# Patient Record
Sex: Male | Born: 1940
Health system: Southern US, Community
[De-identification: ages and names within clinical notes are randomized; demographics above are authoritative.]

## PROBLEM LIST (undated history)

## (undated) DIAGNOSIS — B019 Varicella without complication: Secondary | ICD-10-CM

## (undated) DIAGNOSIS — C4491 Basal cell carcinoma of skin, unspecified: Secondary | ICD-10-CM

## (undated) DIAGNOSIS — R55 Syncope and collapse: Secondary | ICD-10-CM

## (undated) DIAGNOSIS — I10 Essential (primary) hypertension: Secondary | ICD-10-CM

## (undated) DIAGNOSIS — D472 Monoclonal gammopathy: Secondary | ICD-10-CM

## (undated) DIAGNOSIS — M1990 Unspecified osteoarthritis, unspecified site: Secondary | ICD-10-CM

## (undated) DIAGNOSIS — Z8619 Personal history of other infectious and parasitic diseases: Secondary | ICD-10-CM

## (undated) HISTORY — PX: BASAL CELL CARCINOMA EXCISION: SHX1214

## (undated) HISTORY — DX: Basal cell carcinoma of skin, unspecified: C44.91

## (undated) HISTORY — DX: Personal history of other infectious and parasitic diseases: Z86.19

## (undated) HISTORY — DX: Unspecified osteoarthritis, unspecified site: M19.90

## (undated) HISTORY — DX: Monoclonal gammopathy: D47.2

## (undated) HISTORY — PX: HERNIA REPAIR: SHX51

## (undated) HISTORY — DX: Varicella without complication: B01.9

## (undated) HISTORY — DX: Syncope and collapse: R55

---

## 2014-12-13 DIAGNOSIS — J029 Acute pharyngitis, unspecified: Secondary | ICD-10-CM | POA: Diagnosis not present

## 2014-12-24 DIAGNOSIS — Z85820 Personal history of malignant melanoma of skin: Secondary | ICD-10-CM | POA: Diagnosis not present

## 2014-12-24 DIAGNOSIS — D225 Melanocytic nevi of trunk: Secondary | ICD-10-CM | POA: Diagnosis not present

## 2014-12-24 DIAGNOSIS — L570 Actinic keratosis: Secondary | ICD-10-CM | POA: Diagnosis not present

## 2014-12-24 DIAGNOSIS — L814 Other melanin hyperpigmentation: Secondary | ICD-10-CM | POA: Diagnosis not present

## 2014-12-25 DIAGNOSIS — S50911A Unspecified superficial injury of right forearm, initial encounter: Secondary | ICD-10-CM | POA: Diagnosis not present

## 2014-12-25 DIAGNOSIS — M79631 Pain in right forearm: Secondary | ICD-10-CM | POA: Diagnosis not present

## 2014-12-27 DIAGNOSIS — J069 Acute upper respiratory infection, unspecified: Secondary | ICD-10-CM | POA: Diagnosis not present

## 2014-12-30 DIAGNOSIS — M25531 Pain in right wrist: Secondary | ICD-10-CM | POA: Diagnosis not present

## 2015-01-07 DIAGNOSIS — M25551 Pain in right hip: Secondary | ICD-10-CM | POA: Diagnosis not present

## 2015-01-12 DIAGNOSIS — M25551 Pain in right hip: Secondary | ICD-10-CM | POA: Diagnosis not present

## 2015-01-24 DIAGNOSIS — I10 Essential (primary) hypertension: Secondary | ICD-10-CM | POA: Diagnosis not present

## 2015-01-24 DIAGNOSIS — Y92128 Other place in nursing home as the place of occurrence of the external cause: Secondary | ICD-10-CM | POA: Diagnosis not present

## 2015-01-24 DIAGNOSIS — S4981XA Other specified injuries of right shoulder and upper arm, initial encounter: Secondary | ICD-10-CM | POA: Diagnosis not present

## 2015-01-24 DIAGNOSIS — Z85820 Personal history of malignant melanoma of skin: Secondary | ICD-10-CM | POA: Diagnosis not present

## 2015-01-24 DIAGNOSIS — Z7982 Long term (current) use of aspirin: Secondary | ICD-10-CM | POA: Diagnosis not present

## 2015-01-24 DIAGNOSIS — S42351A Displaced comminuted fracture of shaft of humerus, right arm, initial encounter for closed fracture: Secondary | ICD-10-CM | POA: Diagnosis not present

## 2015-01-24 DIAGNOSIS — Z87891 Personal history of nicotine dependence: Secondary | ICD-10-CM | POA: Diagnosis not present

## 2015-01-24 DIAGNOSIS — S42291A Other displaced fracture of upper end of right humerus, initial encounter for closed fracture: Secondary | ICD-10-CM | POA: Diagnosis not present

## 2015-01-24 DIAGNOSIS — S42295A Other nondisplaced fracture of upper end of left humerus, initial encounter for closed fracture: Secondary | ICD-10-CM | POA: Diagnosis not present

## 2015-01-24 DIAGNOSIS — S51011A Laceration without foreign body of right elbow, initial encounter: Secondary | ICD-10-CM | POA: Diagnosis not present

## 2015-01-24 DIAGNOSIS — T148 Other injury of unspecified body region: Secondary | ICD-10-CM | POA: Diagnosis not present

## 2015-01-24 DIAGNOSIS — W19XXXA Unspecified fall, initial encounter: Secondary | ICD-10-CM | POA: Diagnosis not present

## 2015-01-24 DIAGNOSIS — S50311A Abrasion of right elbow, initial encounter: Secondary | ICD-10-CM | POA: Diagnosis not present

## 2015-01-25 DIAGNOSIS — S42291A Other displaced fracture of upper end of right humerus, initial encounter for closed fracture: Secondary | ICD-10-CM | POA: Diagnosis not present

## 2015-01-25 DIAGNOSIS — S50311A Abrasion of right elbow, initial encounter: Secondary | ICD-10-CM | POA: Diagnosis present

## 2015-01-25 DIAGNOSIS — W19XXXA Unspecified fall, initial encounter: Secondary | ICD-10-CM | POA: Diagnosis not present

## 2015-01-25 DIAGNOSIS — Z806 Family history of leukemia: Secondary | ICD-10-CM | POA: Diagnosis not present

## 2015-01-25 DIAGNOSIS — Y92128 Other place in nursing home as the place of occurrence of the external cause: Secondary | ICD-10-CM | POA: Diagnosis not present

## 2015-01-25 DIAGNOSIS — Z87891 Personal history of nicotine dependence: Secondary | ICD-10-CM | POA: Diagnosis not present

## 2015-01-25 DIAGNOSIS — S42351A Displaced comminuted fracture of shaft of humerus, right arm, initial encounter for closed fracture: Secondary | ICD-10-CM | POA: Diagnosis not present

## 2015-01-25 DIAGNOSIS — Z7982 Long term (current) use of aspirin: Secondary | ICD-10-CM | POA: Diagnosis not present

## 2015-01-25 DIAGNOSIS — Z85820 Personal history of malignant melanoma of skin: Secondary | ICD-10-CM | POA: Diagnosis not present

## 2015-01-25 DIAGNOSIS — I10 Essential (primary) hypertension: Secondary | ICD-10-CM | POA: Diagnosis not present

## 2015-01-25 DIAGNOSIS — Z8249 Family history of ischemic heart disease and other diseases of the circulatory system: Secondary | ICD-10-CM | POA: Diagnosis not present

## 2015-01-25 DIAGNOSIS — S51011A Laceration without foreign body of right elbow, initial encounter: Secondary | ICD-10-CM | POA: Diagnosis not present

## 2015-01-26 DIAGNOSIS — S51011A Laceration without foreign body of right elbow, initial encounter: Secondary | ICD-10-CM | POA: Diagnosis not present

## 2015-01-26 DIAGNOSIS — S42291A Other displaced fracture of upper end of right humerus, initial encounter for closed fracture: Secondary | ICD-10-CM | POA: Diagnosis not present

## 2015-01-26 DIAGNOSIS — M25511 Pain in right shoulder: Secondary | ICD-10-CM | POA: Diagnosis not present

## 2015-02-02 DIAGNOSIS — M25522 Pain in left elbow: Secondary | ICD-10-CM | POA: Diagnosis not present

## 2015-02-04 DIAGNOSIS — S42301D Unspecified fracture of shaft of humerus, right arm, subsequent encounter for fracture with routine healing: Secondary | ICD-10-CM | POA: Diagnosis not present

## 2015-02-04 DIAGNOSIS — J029 Acute pharyngitis, unspecified: Secondary | ICD-10-CM | POA: Diagnosis not present

## 2015-02-11 DIAGNOSIS — K120 Recurrent oral aphthae: Secondary | ICD-10-CM | POA: Diagnosis not present

## 2015-02-16 DIAGNOSIS — S52531D Colles' fracture of right radius, subsequent encounter for closed fracture with routine healing: Secondary | ICD-10-CM | POA: Diagnosis not present

## 2015-02-21 DIAGNOSIS — M25621 Stiffness of right elbow, not elsewhere classified: Secondary | ICD-10-CM | POA: Diagnosis not present

## 2015-02-21 DIAGNOSIS — S52531A Colles' fracture of right radius, initial encounter for closed fracture: Secondary | ICD-10-CM | POA: Diagnosis not present

## 2015-02-21 DIAGNOSIS — M25421 Effusion, right elbow: Secondary | ICD-10-CM | POA: Diagnosis not present

## 2015-02-21 DIAGNOSIS — M25531 Pain in right wrist: Secondary | ICD-10-CM | POA: Diagnosis not present

## 2015-02-21 DIAGNOSIS — B002 Herpesviral gingivostomatitis and pharyngotonsillitis: Secondary | ICD-10-CM | POA: Diagnosis not present

## 2015-02-23 DIAGNOSIS — M25531 Pain in right wrist: Secondary | ICD-10-CM | POA: Diagnosis not present

## 2015-02-23 DIAGNOSIS — S51011A Laceration without foreign body of right elbow, initial encounter: Secondary | ICD-10-CM | POA: Diagnosis not present

## 2015-02-23 DIAGNOSIS — M25421 Effusion, right elbow: Secondary | ICD-10-CM | POA: Diagnosis not present

## 2015-02-23 DIAGNOSIS — S42291S Other displaced fracture of upper end of right humerus, sequela: Secondary | ICD-10-CM | POA: Diagnosis not present

## 2015-02-28 DIAGNOSIS — M25621 Stiffness of right elbow, not elsewhere classified: Secondary | ICD-10-CM | POA: Diagnosis not present

## 2015-02-28 DIAGNOSIS — M25421 Effusion, right elbow: Secondary | ICD-10-CM | POA: Diagnosis not present

## 2015-02-28 DIAGNOSIS — M25531 Pain in right wrist: Secondary | ICD-10-CM | POA: Diagnosis not present

## 2015-02-28 DIAGNOSIS — S42291S Other displaced fracture of upper end of right humerus, sequela: Secondary | ICD-10-CM | POA: Diagnosis not present

## 2015-03-02 DIAGNOSIS — M25511 Pain in right shoulder: Secondary | ICD-10-CM | POA: Diagnosis not present

## 2015-03-02 DIAGNOSIS — M25621 Stiffness of right elbow, not elsewhere classified: Secondary | ICD-10-CM | POA: Diagnosis not present

## 2015-03-02 DIAGNOSIS — S42291S Other displaced fracture of upper end of right humerus, sequela: Secondary | ICD-10-CM | POA: Diagnosis not present

## 2015-03-02 DIAGNOSIS — S51011A Laceration without foreign body of right elbow, initial encounter: Secondary | ICD-10-CM | POA: Diagnosis not present

## 2015-03-04 DIAGNOSIS — J384 Edema of larynx: Secondary | ICD-10-CM | POA: Diagnosis not present

## 2015-03-04 DIAGNOSIS — K1230 Oral mucositis (ulcerative), unspecified: Secondary | ICD-10-CM | POA: Diagnosis not present

## 2015-03-04 DIAGNOSIS — K121 Other forms of stomatitis: Secondary | ICD-10-CM | POA: Diagnosis not present

## 2015-03-07 DIAGNOSIS — S42291S Other displaced fracture of upper end of right humerus, sequela: Secondary | ICD-10-CM | POA: Diagnosis not present

## 2015-03-07 DIAGNOSIS — M25511 Pain in right shoulder: Secondary | ICD-10-CM | POA: Diagnosis not present

## 2015-03-07 DIAGNOSIS — M25421 Effusion, right elbow: Secondary | ICD-10-CM | POA: Diagnosis not present

## 2015-03-07 DIAGNOSIS — M25621 Stiffness of right elbow, not elsewhere classified: Secondary | ICD-10-CM | POA: Diagnosis not present

## 2015-03-10 DIAGNOSIS — M25421 Effusion, right elbow: Secondary | ICD-10-CM | POA: Diagnosis not present

## 2015-03-10 DIAGNOSIS — M25621 Stiffness of right elbow, not elsewhere classified: Secondary | ICD-10-CM | POA: Diagnosis not present

## 2015-03-10 DIAGNOSIS — S52531D Colles' fracture of right radius, subsequent encounter for closed fracture with routine healing: Secondary | ICD-10-CM | POA: Diagnosis not present

## 2015-03-10 DIAGNOSIS — S42291S Other displaced fracture of upper end of right humerus, sequela: Secondary | ICD-10-CM | POA: Diagnosis not present

## 2015-03-14 DIAGNOSIS — S42291S Other displaced fracture of upper end of right humerus, sequela: Secondary | ICD-10-CM | POA: Diagnosis not present

## 2015-03-14 DIAGNOSIS — M25511 Pain in right shoulder: Secondary | ICD-10-CM | POA: Diagnosis not present

## 2015-03-14 DIAGNOSIS — M25421 Effusion, right elbow: Secondary | ICD-10-CM | POA: Diagnosis not present

## 2015-03-14 DIAGNOSIS — M25621 Stiffness of right elbow, not elsewhere classified: Secondary | ICD-10-CM | POA: Diagnosis not present

## 2015-03-16 DIAGNOSIS — S42291S Other displaced fracture of upper end of right humerus, sequela: Secondary | ICD-10-CM | POA: Diagnosis not present

## 2015-03-16 DIAGNOSIS — K1230 Oral mucositis (ulcerative), unspecified: Secondary | ICD-10-CM | POA: Diagnosis not present

## 2015-03-16 DIAGNOSIS — K121 Other forms of stomatitis: Secondary | ICD-10-CM | POA: Diagnosis not present

## 2015-03-17 DIAGNOSIS — Z85820 Personal history of malignant melanoma of skin: Secondary | ICD-10-CM | POA: Diagnosis not present

## 2015-03-17 DIAGNOSIS — Z87442 Personal history of urinary calculi: Secondary | ICD-10-CM | POA: Diagnosis not present

## 2015-03-17 DIAGNOSIS — Z7982 Long term (current) use of aspirin: Secondary | ICD-10-CM | POA: Diagnosis not present

## 2015-03-17 DIAGNOSIS — M545 Low back pain: Secondary | ICD-10-CM | POA: Diagnosis not present

## 2015-03-17 DIAGNOSIS — R52 Pain, unspecified: Secondary | ICD-10-CM | POA: Diagnosis not present

## 2015-03-17 DIAGNOSIS — I10 Essential (primary) hypertension: Secondary | ICD-10-CM | POA: Diagnosis not present

## 2015-03-17 DIAGNOSIS — N201 Calculus of ureter: Secondary | ICD-10-CM | POA: Diagnosis not present

## 2015-03-17 DIAGNOSIS — N200 Calculus of kidney: Secondary | ICD-10-CM | POA: Diagnosis not present

## 2015-03-17 DIAGNOSIS — Z79899 Other long term (current) drug therapy: Secondary | ICD-10-CM | POA: Diagnosis not present

## 2015-03-18 DIAGNOSIS — N400 Enlarged prostate without lower urinary tract symptoms: Secondary | ICD-10-CM | POA: Diagnosis not present

## 2015-03-18 DIAGNOSIS — N201 Calculus of ureter: Secondary | ICD-10-CM | POA: Diagnosis not present

## 2015-03-22 DIAGNOSIS — N400 Enlarged prostate without lower urinary tract symptoms: Secondary | ICD-10-CM | POA: Diagnosis not present

## 2015-03-22 DIAGNOSIS — N201 Calculus of ureter: Secondary | ICD-10-CM | POA: Diagnosis not present

## 2015-03-22 DIAGNOSIS — N200 Calculus of kidney: Secondary | ICD-10-CM | POA: Diagnosis not present

## 2015-03-22 DIAGNOSIS — Z79899 Other long term (current) drug therapy: Secondary | ICD-10-CM | POA: Diagnosis not present

## 2015-03-22 DIAGNOSIS — Z87891 Personal history of nicotine dependence: Secondary | ICD-10-CM | POA: Diagnosis not present

## 2015-03-22 DIAGNOSIS — I10 Essential (primary) hypertension: Secondary | ICD-10-CM | POA: Diagnosis not present

## 2015-03-22 DIAGNOSIS — Z7982 Long term (current) use of aspirin: Secondary | ICD-10-CM | POA: Diagnosis not present

## 2015-03-24 DIAGNOSIS — M25511 Pain in right shoulder: Secondary | ICD-10-CM | POA: Diagnosis not present

## 2015-03-24 DIAGNOSIS — M25611 Stiffness of right shoulder, not elsewhere classified: Secondary | ICD-10-CM | POA: Diagnosis not present

## 2015-03-24 DIAGNOSIS — S42291S Other displaced fracture of upper end of right humerus, sequela: Secondary | ICD-10-CM | POA: Diagnosis not present

## 2015-03-24 DIAGNOSIS — M25621 Stiffness of right elbow, not elsewhere classified: Secondary | ICD-10-CM | POA: Diagnosis not present

## 2015-03-30 DIAGNOSIS — N201 Calculus of ureter: Secondary | ICD-10-CM | POA: Diagnosis not present

## 2015-03-30 DIAGNOSIS — N200 Calculus of kidney: Secondary | ICD-10-CM | POA: Diagnosis not present

## 2015-03-31 DIAGNOSIS — M25621 Stiffness of right elbow, not elsewhere classified: Secondary | ICD-10-CM | POA: Diagnosis not present

## 2015-03-31 DIAGNOSIS — M25611 Stiffness of right shoulder, not elsewhere classified: Secondary | ICD-10-CM | POA: Diagnosis not present

## 2015-03-31 DIAGNOSIS — S42291S Other displaced fracture of upper end of right humerus, sequela: Secondary | ICD-10-CM | POA: Diagnosis not present

## 2015-04-04 DIAGNOSIS — M25621 Stiffness of right elbow, not elsewhere classified: Secondary | ICD-10-CM | POA: Diagnosis not present

## 2015-04-04 DIAGNOSIS — M25531 Pain in right wrist: Secondary | ICD-10-CM | POA: Diagnosis not present

## 2015-04-04 DIAGNOSIS — M25511 Pain in right shoulder: Secondary | ICD-10-CM | POA: Diagnosis not present

## 2015-04-04 DIAGNOSIS — M25611 Stiffness of right shoulder, not elsewhere classified: Secondary | ICD-10-CM | POA: Diagnosis not present

## 2015-04-07 DIAGNOSIS — M25621 Stiffness of right elbow, not elsewhere classified: Secondary | ICD-10-CM | POA: Diagnosis not present

## 2015-04-07 DIAGNOSIS — S42291S Other displaced fracture of upper end of right humerus, sequela: Secondary | ICD-10-CM | POA: Diagnosis not present

## 2015-04-07 DIAGNOSIS — M25611 Stiffness of right shoulder, not elsewhere classified: Secondary | ICD-10-CM | POA: Diagnosis not present

## 2015-04-07 DIAGNOSIS — M25511 Pain in right shoulder: Secondary | ICD-10-CM | POA: Diagnosis not present

## 2015-04-12 DIAGNOSIS — M25511 Pain in right shoulder: Secondary | ICD-10-CM | POA: Diagnosis not present

## 2015-04-12 DIAGNOSIS — M25621 Stiffness of right elbow, not elsewhere classified: Secondary | ICD-10-CM | POA: Diagnosis not present

## 2015-04-12 DIAGNOSIS — M25611 Stiffness of right shoulder, not elsewhere classified: Secondary | ICD-10-CM | POA: Diagnosis not present

## 2015-04-12 DIAGNOSIS — S42291S Other displaced fracture of upper end of right humerus, sequela: Secondary | ICD-10-CM | POA: Diagnosis not present

## 2015-04-14 DIAGNOSIS — N201 Calculus of ureter: Secondary | ICD-10-CM | POA: Diagnosis not present

## 2015-04-20 DIAGNOSIS — S42291S Other displaced fracture of upper end of right humerus, sequela: Secondary | ICD-10-CM | POA: Diagnosis not present

## 2015-04-20 DIAGNOSIS — G5731 Lesion of lateral popliteal nerve, right lower limb: Secondary | ICD-10-CM | POA: Diagnosis not present

## 2015-04-20 DIAGNOSIS — M25511 Pain in right shoulder: Secondary | ICD-10-CM | POA: Diagnosis not present

## 2015-04-20 DIAGNOSIS — M25621 Stiffness of right elbow, not elsewhere classified: Secondary | ICD-10-CM | POA: Diagnosis not present

## 2015-04-20 DIAGNOSIS — S42294D Other nondisplaced fracture of upper end of right humerus, subsequent encounter for fracture with routine healing: Secondary | ICD-10-CM | POA: Diagnosis not present

## 2015-04-20 DIAGNOSIS — M25611 Stiffness of right shoulder, not elsewhere classified: Secondary | ICD-10-CM | POA: Diagnosis not present

## 2015-04-25 DIAGNOSIS — M21371 Foot drop, right foot: Secondary | ICD-10-CM | POA: Diagnosis not present

## 2015-04-25 DIAGNOSIS — M545 Low back pain: Secondary | ICD-10-CM | POA: Diagnosis not present

## 2015-04-26 DIAGNOSIS — Z0000 Encounter for general adult medical examination without abnormal findings: Secondary | ICD-10-CM | POA: Diagnosis not present

## 2015-04-26 DIAGNOSIS — R972 Elevated prostate specific antigen [PSA]: Secondary | ICD-10-CM | POA: Diagnosis not present

## 2015-04-26 DIAGNOSIS — I10 Essential (primary) hypertension: Secondary | ICD-10-CM | POA: Diagnosis not present

## 2015-04-26 DIAGNOSIS — N200 Calculus of kidney: Secondary | ICD-10-CM | POA: Diagnosis not present

## 2015-04-26 DIAGNOSIS — C4359 Malignant melanoma of other part of trunk: Secondary | ICD-10-CM | POA: Diagnosis not present

## 2015-04-28 DIAGNOSIS — M25611 Stiffness of right shoulder, not elsewhere classified: Secondary | ICD-10-CM | POA: Diagnosis not present

## 2015-04-28 DIAGNOSIS — S42291S Other displaced fracture of upper end of right humerus, sequela: Secondary | ICD-10-CM | POA: Diagnosis not present

## 2015-04-28 DIAGNOSIS — M25621 Stiffness of right elbow, not elsewhere classified: Secondary | ICD-10-CM | POA: Diagnosis not present

## 2015-04-28 DIAGNOSIS — M6281 Muscle weakness (generalized): Secondary | ICD-10-CM | POA: Diagnosis not present

## 2015-05-02 DIAGNOSIS — Z135 Encounter for screening for eye and ear disorders: Secondary | ICD-10-CM | POA: Diagnosis not present

## 2015-05-02 DIAGNOSIS — H40013 Open angle with borderline findings, low risk, bilateral: Secondary | ICD-10-CM | POA: Diagnosis not present

## 2015-05-02 DIAGNOSIS — H2513 Age-related nuclear cataract, bilateral: Secondary | ICD-10-CM | POA: Diagnosis not present

## 2015-05-02 DIAGNOSIS — H5213 Myopia, bilateral: Secondary | ICD-10-CM | POA: Diagnosis not present

## 2015-05-03 DIAGNOSIS — M25611 Stiffness of right shoulder, not elsewhere classified: Secondary | ICD-10-CM | POA: Diagnosis not present

## 2015-05-03 DIAGNOSIS — M6281 Muscle weakness (generalized): Secondary | ICD-10-CM | POA: Diagnosis not present

## 2015-05-05 DIAGNOSIS — M6281 Muscle weakness (generalized): Secondary | ICD-10-CM | POA: Diagnosis not present

## 2015-05-05 DIAGNOSIS — M25611 Stiffness of right shoulder, not elsewhere classified: Secondary | ICD-10-CM | POA: Diagnosis not present

## 2015-05-05 DIAGNOSIS — S42294D Other nondisplaced fracture of upper end of right humerus, subsequent encounter for fracture with routine healing: Secondary | ICD-10-CM | POA: Diagnosis not present

## 2015-05-06 DIAGNOSIS — M4806 Spinal stenosis, lumbar region: Secondary | ICD-10-CM | POA: Diagnosis not present

## 2015-05-06 DIAGNOSIS — M21371 Foot drop, right foot: Secondary | ICD-10-CM | POA: Diagnosis not present

## 2015-05-12 DIAGNOSIS — M25621 Stiffness of right elbow, not elsewhere classified: Secondary | ICD-10-CM | POA: Diagnosis not present

## 2015-05-12 DIAGNOSIS — M6281 Muscle weakness (generalized): Secondary | ICD-10-CM | POA: Diagnosis not present

## 2015-05-12 DIAGNOSIS — S42294D Other nondisplaced fracture of upper end of right humerus, subsequent encounter for fracture with routine healing: Secondary | ICD-10-CM | POA: Diagnosis not present

## 2015-05-12 DIAGNOSIS — M25611 Stiffness of right shoulder, not elsewhere classified: Secondary | ICD-10-CM | POA: Diagnosis not present

## 2015-05-19 DIAGNOSIS — S42294D Other nondisplaced fracture of upper end of right humerus, subsequent encounter for fracture with routine healing: Secondary | ICD-10-CM | POA: Diagnosis not present

## 2015-05-19 DIAGNOSIS — M6281 Muscle weakness (generalized): Secondary | ICD-10-CM | POA: Diagnosis not present

## 2015-05-19 DIAGNOSIS — M25611 Stiffness of right shoulder, not elsewhere classified: Secondary | ICD-10-CM | POA: Diagnosis not present

## 2015-05-23 DIAGNOSIS — M21371 Foot drop, right foot: Secondary | ICD-10-CM | POA: Diagnosis not present

## 2015-05-23 DIAGNOSIS — M4806 Spinal stenosis, lumbar region: Secondary | ICD-10-CM | POA: Diagnosis not present

## 2015-05-23 DIAGNOSIS — M6281 Muscle weakness (generalized): Secondary | ICD-10-CM | POA: Diagnosis not present

## 2015-05-23 DIAGNOSIS — M25621 Stiffness of right elbow, not elsewhere classified: Secondary | ICD-10-CM | POA: Diagnosis not present

## 2015-05-23 DIAGNOSIS — M25611 Stiffness of right shoulder, not elsewhere classified: Secondary | ICD-10-CM | POA: Diagnosis not present

## 2015-05-23 DIAGNOSIS — G5730 Lesion of lateral popliteal nerve, unspecified lower limb: Secondary | ICD-10-CM | POA: Diagnosis not present

## 2015-05-31 DIAGNOSIS — S40021A Contusion of right upper arm, initial encounter: Secondary | ICD-10-CM | POA: Diagnosis not present

## 2015-06-09 DIAGNOSIS — D649 Anemia, unspecified: Secondary | ICD-10-CM | POA: Diagnosis not present

## 2015-06-16 DIAGNOSIS — M810 Age-related osteoporosis without current pathological fracture: Secondary | ICD-10-CM | POA: Diagnosis not present

## 2015-06-24 DIAGNOSIS — L570 Actinic keratosis: Secondary | ICD-10-CM | POA: Diagnosis not present

## 2015-06-24 DIAGNOSIS — L821 Other seborrheic keratosis: Secondary | ICD-10-CM | POA: Diagnosis not present

## 2015-06-24 DIAGNOSIS — D1801 Hemangioma of skin and subcutaneous tissue: Secondary | ICD-10-CM | POA: Diagnosis not present

## 2015-06-24 DIAGNOSIS — Z85820 Personal history of malignant melanoma of skin: Secondary | ICD-10-CM | POA: Diagnosis not present

## 2015-07-01 DIAGNOSIS — R5383 Other fatigue: Secondary | ICD-10-CM | POA: Diagnosis not present

## 2015-07-01 DIAGNOSIS — M810 Age-related osteoporosis without current pathological fracture: Secondary | ICD-10-CM | POA: Diagnosis not present

## 2015-07-21 DIAGNOSIS — D649 Anemia, unspecified: Secondary | ICD-10-CM | POA: Diagnosis not present

## 2015-07-25 DIAGNOSIS — G5730 Lesion of lateral popliteal nerve, unspecified lower limb: Secondary | ICD-10-CM | POA: Diagnosis not present

## 2015-07-25 DIAGNOSIS — M21371 Foot drop, right foot: Secondary | ICD-10-CM | POA: Diagnosis not present

## 2015-07-27 DIAGNOSIS — F0390 Unspecified dementia without behavioral disturbance: Secondary | ICD-10-CM | POA: Diagnosis not present

## 2015-08-04 DIAGNOSIS — F0390 Unspecified dementia without behavioral disturbance: Secondary | ICD-10-CM | POA: Diagnosis not present

## 2015-08-04 DIAGNOSIS — R220 Localized swelling, mass and lump, head: Secondary | ICD-10-CM | POA: Diagnosis not present

## 2015-08-10 DIAGNOSIS — W108XXA Fall (on) (from) other stairs and steps, initial encounter: Secondary | ICD-10-CM | POA: Diagnosis not present

## 2015-08-10 DIAGNOSIS — T148 Other injury of unspecified body region: Secondary | ICD-10-CM | POA: Diagnosis not present

## 2015-08-10 DIAGNOSIS — Z6822 Body mass index (BMI) 22.0-22.9, adult: Secondary | ICD-10-CM | POA: Diagnosis not present

## 2015-08-13 DIAGNOSIS — Z6822 Body mass index (BMI) 22.0-22.9, adult: Secondary | ICD-10-CM | POA: Diagnosis not present

## 2015-08-13 DIAGNOSIS — Z5189 Encounter for other specified aftercare: Secondary | ICD-10-CM | POA: Diagnosis not present

## 2015-08-16 DIAGNOSIS — Z6822 Body mass index (BMI) 22.0-22.9, adult: Secondary | ICD-10-CM | POA: Diagnosis not present

## 2015-08-16 DIAGNOSIS — Z4800 Encounter for change or removal of nonsurgical wound dressing: Secondary | ICD-10-CM | POA: Diagnosis not present

## 2015-08-16 DIAGNOSIS — Z5189 Encounter for other specified aftercare: Secondary | ICD-10-CM | POA: Diagnosis not present

## 2015-08-17 DIAGNOSIS — Q283 Other malformations of cerebral vessels: Secondary | ICD-10-CM | POA: Diagnosis not present

## 2015-08-17 DIAGNOSIS — R220 Localized swelling, mass and lump, head: Secondary | ICD-10-CM | POA: Diagnosis not present

## 2015-08-17 DIAGNOSIS — I10 Essential (primary) hypertension: Secondary | ICD-10-CM | POA: Diagnosis not present

## 2015-08-17 DIAGNOSIS — F0390 Unspecified dementia without behavioral disturbance: Secondary | ICD-10-CM | POA: Diagnosis not present

## 2015-08-22 DIAGNOSIS — F0390 Unspecified dementia without behavioral disturbance: Secondary | ICD-10-CM | POA: Diagnosis not present

## 2015-09-14 DIAGNOSIS — G939 Disorder of brain, unspecified: Secondary | ICD-10-CM | POA: Diagnosis not present

## 2015-09-19 DIAGNOSIS — Z79899 Other long term (current) drug therapy: Secondary | ICD-10-CM | POA: Diagnosis not present

## 2015-09-19 DIAGNOSIS — I10 Essential (primary) hypertension: Secondary | ICD-10-CM | POA: Diagnosis not present

## 2015-09-19 DIAGNOSIS — H1132 Conjunctival hemorrhage, left eye: Secondary | ICD-10-CM | POA: Diagnosis not present

## 2015-09-20 DIAGNOSIS — Q046 Congenital cerebral cysts: Secondary | ICD-10-CM | POA: Diagnosis not present

## 2015-09-20 DIAGNOSIS — R413 Other amnesia: Secondary | ICD-10-CM | POA: Diagnosis not present

## 2015-09-20 DIAGNOSIS — G609 Hereditary and idiopathic neuropathy, unspecified: Secondary | ICD-10-CM | POA: Diagnosis not present

## 2015-09-22 DIAGNOSIS — H01024 Squamous blepharitis left upper eyelid: Secondary | ICD-10-CM | POA: Diagnosis not present

## 2015-09-22 DIAGNOSIS — H1132 Conjunctival hemorrhage, left eye: Secondary | ICD-10-CM | POA: Diagnosis not present

## 2015-09-22 DIAGNOSIS — H2513 Age-related nuclear cataract, bilateral: Secondary | ICD-10-CM | POA: Diagnosis not present

## 2015-09-22 DIAGNOSIS — H01021 Squamous blepharitis right upper eyelid: Secondary | ICD-10-CM | POA: Diagnosis not present

## 2015-09-27 DIAGNOSIS — Z23 Encounter for immunization: Secondary | ICD-10-CM | POA: Diagnosis not present

## 2015-11-01 DIAGNOSIS — R531 Weakness: Secondary | ICD-10-CM | POA: Diagnosis not present

## 2015-11-01 DIAGNOSIS — M818 Other osteoporosis without current pathological fracture: Secondary | ICD-10-CM | POA: Diagnosis not present

## 2015-11-01 DIAGNOSIS — D472 Monoclonal gammopathy: Secondary | ICD-10-CM | POA: Diagnosis not present

## 2015-11-07 DIAGNOSIS — N201 Calculus of ureter: Secondary | ICD-10-CM | POA: Diagnosis not present

## 2015-11-11 DIAGNOSIS — R079 Chest pain, unspecified: Secondary | ICD-10-CM | POA: Diagnosis not present

## 2015-11-11 DIAGNOSIS — W010XXA Fall on same level from slipping, tripping and stumbling without subsequent striking against object, initial encounter: Secondary | ICD-10-CM | POA: Diagnosis not present

## 2015-11-11 DIAGNOSIS — S50312A Abrasion of left elbow, initial encounter: Secondary | ICD-10-CM | POA: Diagnosis not present

## 2015-11-11 DIAGNOSIS — S80212A Abrasion, left knee, initial encounter: Secondary | ICD-10-CM | POA: Diagnosis not present

## 2015-11-11 DIAGNOSIS — I10 Essential (primary) hypertension: Secondary | ICD-10-CM | POA: Diagnosis not present

## 2015-11-11 DIAGNOSIS — S20212A Contusion of left front wall of thorax, initial encounter: Secondary | ICD-10-CM | POA: Diagnosis not present

## 2015-11-12 DIAGNOSIS — R079 Chest pain, unspecified: Secondary | ICD-10-CM | POA: Diagnosis not present

## 2015-11-12 DIAGNOSIS — W19XXXA Unspecified fall, initial encounter: Secondary | ICD-10-CM | POA: Diagnosis not present

## 2015-11-12 DIAGNOSIS — S62391A Other fracture of second metacarpal bone, left hand, initial encounter for closed fracture: Secondary | ICD-10-CM | POA: Diagnosis not present

## 2015-11-12 DIAGNOSIS — S20212A Contusion of left front wall of thorax, initial encounter: Secondary | ICD-10-CM | POA: Diagnosis not present

## 2015-11-12 DIAGNOSIS — S80212A Abrasion, left knee, initial encounter: Secondary | ICD-10-CM | POA: Diagnosis not present

## 2015-11-12 DIAGNOSIS — I10 Essential (primary) hypertension: Secondary | ICD-10-CM | POA: Diagnosis not present

## 2015-11-12 DIAGNOSIS — W010XXA Fall on same level from slipping, tripping and stumbling without subsequent striking against object, initial encounter: Secondary | ICD-10-CM | POA: Diagnosis not present

## 2015-11-12 DIAGNOSIS — S50312A Abrasion of left elbow, initial encounter: Secondary | ICD-10-CM | POA: Diagnosis not present

## 2015-11-12 DIAGNOSIS — S40812A Abrasion of left upper arm, initial encounter: Secondary | ICD-10-CM | POA: Diagnosis not present

## 2015-11-12 DIAGNOSIS — S6722XA Crushing injury of left hand, initial encounter: Secondary | ICD-10-CM | POA: Diagnosis not present

## 2015-11-12 DIAGNOSIS — Y998 Other external cause status: Secondary | ICD-10-CM | POA: Diagnosis not present

## 2015-11-12 DIAGNOSIS — M79645 Pain in left finger(s): Secondary | ICD-10-CM | POA: Diagnosis not present

## 2015-11-17 DIAGNOSIS — D472 Monoclonal gammopathy: Secondary | ICD-10-CM | POA: Diagnosis not present

## 2015-11-17 DIAGNOSIS — M818 Other osteoporosis without current pathological fracture: Secondary | ICD-10-CM | POA: Diagnosis not present

## 2015-11-22 DIAGNOSIS — M818 Other osteoporosis without current pathological fracture: Secondary | ICD-10-CM | POA: Diagnosis not present

## 2015-11-22 DIAGNOSIS — Z4800 Encounter for change or removal of nonsurgical wound dressing: Secondary | ICD-10-CM | POA: Diagnosis not present

## 2015-11-22 DIAGNOSIS — W19XXXA Unspecified fall, initial encounter: Secondary | ICD-10-CM | POA: Diagnosis not present

## 2015-11-22 DIAGNOSIS — T148 Other injury of unspecified body region: Secondary | ICD-10-CM | POA: Diagnosis not present

## 2015-11-22 DIAGNOSIS — Z6822 Body mass index (BMI) 22.0-22.9, adult: Secondary | ICD-10-CM | POA: Diagnosis not present

## 2015-11-23 DIAGNOSIS — S6990XA Unspecified injury of unspecified wrist, hand and finger(s), initial encounter: Secondary | ICD-10-CM | POA: Diagnosis not present

## 2015-12-16 DIAGNOSIS — J019 Acute sinusitis, unspecified: Secondary | ICD-10-CM | POA: Diagnosis not present

## 2015-12-27 DIAGNOSIS — Z23 Encounter for immunization: Secondary | ICD-10-CM | POA: Diagnosis not present

## 2015-12-27 DIAGNOSIS — R972 Elevated prostate specific antigen [PSA]: Secondary | ICD-10-CM | POA: Diagnosis not present

## 2015-12-27 DIAGNOSIS — M810 Age-related osteoporosis without current pathological fracture: Secondary | ICD-10-CM | POA: Diagnosis not present

## 2015-12-27 DIAGNOSIS — I10 Essential (primary) hypertension: Secondary | ICD-10-CM | POA: Diagnosis not present

## 2015-12-27 DIAGNOSIS — C4359 Malignant melanoma of other part of trunk: Secondary | ICD-10-CM | POA: Diagnosis not present

## 2015-12-27 DIAGNOSIS — Z111 Encounter for screening for respiratory tuberculosis: Secondary | ICD-10-CM | POA: Diagnosis not present

## 2015-12-27 DIAGNOSIS — N200 Calculus of kidney: Secondary | ICD-10-CM | POA: Diagnosis not present

## 2015-12-29 DIAGNOSIS — L821 Other seborrheic keratosis: Secondary | ICD-10-CM | POA: Diagnosis not present

## 2015-12-29 DIAGNOSIS — L814 Other melanin hyperpigmentation: Secondary | ICD-10-CM | POA: Diagnosis not present

## 2015-12-29 DIAGNOSIS — D1801 Hemangioma of skin and subcutaneous tissue: Secondary | ICD-10-CM | POA: Diagnosis not present

## 2015-12-29 DIAGNOSIS — L570 Actinic keratosis: Secondary | ICD-10-CM | POA: Diagnosis not present

## 2016-01-27 DIAGNOSIS — Z23 Encounter for immunization: Secondary | ICD-10-CM | POA: Diagnosis not present

## 2016-02-27 DIAGNOSIS — G609 Hereditary and idiopathic neuropathy, unspecified: Secondary | ICD-10-CM | POA: Diagnosis not present

## 2016-02-27 DIAGNOSIS — Q046 Congenital cerebral cysts: Secondary | ICD-10-CM | POA: Diagnosis not present

## 2016-03-29 DIAGNOSIS — G609 Hereditary and idiopathic neuropathy, unspecified: Secondary | ICD-10-CM | POA: Diagnosis not present

## 2016-03-29 DIAGNOSIS — Q046 Congenital cerebral cysts: Secondary | ICD-10-CM | POA: Diagnosis not present

## 2016-03-29 DIAGNOSIS — D472 Monoclonal gammopathy: Secondary | ICD-10-CM | POA: Diagnosis not present

## 2016-04-05 DIAGNOSIS — M6281 Muscle weakness (generalized): Secondary | ICD-10-CM | POA: Diagnosis not present

## 2016-04-05 DIAGNOSIS — R2681 Unsteadiness on feet: Secondary | ICD-10-CM | POA: Diagnosis not present

## 2016-04-10 ENCOUNTER — Ambulatory Visit (INDEPENDENT_AMBULATORY_CARE_PROVIDER_SITE_OTHER): Payer: Medicare Other | Admitting: Family Medicine

## 2016-04-10 ENCOUNTER — Encounter: Payer: Self-pay | Admitting: Family Medicine

## 2016-04-10 VITALS — BP 122/84 | HR 83 | Temp 98.2°F | Ht 67.0 in | Wt 148.2 lb

## 2016-04-10 DIAGNOSIS — I10 Essential (primary) hypertension: Secondary | ICD-10-CM | POA: Diagnosis not present

## 2016-04-10 DIAGNOSIS — M25511 Pain in right shoulder: Secondary | ICD-10-CM

## 2016-04-10 DIAGNOSIS — D472 Monoclonal gammopathy: Secondary | ICD-10-CM | POA: Insufficient documentation

## 2016-04-10 DIAGNOSIS — M25611 Stiffness of right shoulder, not elsewhere classified: Secondary | ICD-10-CM | POA: Insufficient documentation

## 2016-04-10 DIAGNOSIS — E8809 Other disorders of plasma-protein metabolism, not elsewhere classified: Secondary | ICD-10-CM | POA: Diagnosis not present

## 2016-04-10 DIAGNOSIS — R779 Abnormality of plasma protein, unspecified: Secondary | ICD-10-CM

## 2016-04-10 NOTE — Assessment & Plan Note (Signed)
Patient reports recently he was found to have elevated protein in his blood. He has an appointment with [facility] next week. He'll keep this appointment.
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
## 2016-04-10 NOTE — Assessment & Plan Note (Signed)
Patient with prior history of fracture and right upper arm or shoulder. Has decreased range of motion. We will refer to occupational therapy for further management and evaluation of this issue. We'll see back in one month to ensure he is improving.

## 2016-04-10 NOTE — Patient Instructions (Signed)
Nice to meet you. We are going to refer you to occupational therapy. Please continue your lisinopril for your blood pressure. Please keep your follow-up with hematology for your protein.

## 2016-04-10 NOTE — Assessment & Plan Note (Signed)
Blood pressure at goal. Continue lisinopril. We will request records from his prior PCP

## 2016-04-10 NOTE — Progress Notes (Signed)
Pre visit review using our clinic review tool, if applicable. No additional management support is needed unless otherwise documented below in the visit note. 

## 2016-04-11 DIAGNOSIS — M6281 Muscle weakness (generalized): Secondary | ICD-10-CM | POA: Diagnosis not present

## 2016-04-11 DIAGNOSIS — R2681 Unsteadiness on feet: Secondary | ICD-10-CM | POA: Diagnosis not present

## 2016-04-12 DIAGNOSIS — M6281 Muscle weakness (generalized): Secondary | ICD-10-CM | POA: Diagnosis not present

## 2016-04-12 DIAGNOSIS — R2681 Unsteadiness on feet: Secondary | ICD-10-CM | POA: Diagnosis not present

## 2016-04-17 DIAGNOSIS — R2681 Unsteadiness on feet: Secondary | ICD-10-CM | POA: Diagnosis not present

## 2016-04-17 DIAGNOSIS — D472 Monoclonal gammopathy: Secondary | ICD-10-CM | POA: Diagnosis not present

## 2016-04-17 DIAGNOSIS — M6281 Muscle weakness (generalized): Secondary | ICD-10-CM | POA: Diagnosis not present

## 2016-04-17 DIAGNOSIS — I10 Essential (primary) hypertension: Secondary | ICD-10-CM | POA: Diagnosis not present

## 2016-04-17 DIAGNOSIS — Z87891 Personal history of nicotine dependence: Secondary | ICD-10-CM | POA: Diagnosis not present

## 2016-04-17 DIAGNOSIS — R413 Other amnesia: Secondary | ICD-10-CM | POA: Diagnosis not present

## 2016-04-17 DIAGNOSIS — D649 Anemia, unspecified: Secondary | ICD-10-CM | POA: Diagnosis not present

## 2016-04-17 DIAGNOSIS — M810 Age-related osteoporosis without current pathological fracture: Secondary | ICD-10-CM | POA: Diagnosis not present

## 2016-04-19 DIAGNOSIS — R2681 Unsteadiness on feet: Secondary | ICD-10-CM | POA: Diagnosis not present

## 2016-04-19 DIAGNOSIS — M6281 Muscle weakness (generalized): Secondary | ICD-10-CM | POA: Diagnosis not present

## 2016-04-24 DIAGNOSIS — R2681 Unsteadiness on feet: Secondary | ICD-10-CM | POA: Diagnosis not present

## 2016-04-24 DIAGNOSIS — M6281 Muscle weakness (generalized): Secondary | ICD-10-CM | POA: Diagnosis not present

## 2016-04-26 DIAGNOSIS — R2681 Unsteadiness on feet: Secondary | ICD-10-CM | POA: Diagnosis not present

## 2016-04-26 DIAGNOSIS — M6281 Muscle weakness (generalized): Secondary | ICD-10-CM | POA: Diagnosis not present

## 2016-05-01 DIAGNOSIS — D472 Monoclonal gammopathy: Secondary | ICD-10-CM | POA: Diagnosis not present

## 2016-05-08 DIAGNOSIS — M6281 Muscle weakness (generalized): Secondary | ICD-10-CM | POA: Diagnosis not present

## 2016-05-08 DIAGNOSIS — R2681 Unsteadiness on feet: Secondary | ICD-10-CM | POA: Diagnosis not present

## 2016-05-10 DIAGNOSIS — R279 Unspecified lack of coordination: Secondary | ICD-10-CM | POA: Diagnosis not present

## 2016-05-10 DIAGNOSIS — M6281 Muscle weakness (generalized): Secondary | ICD-10-CM | POA: Diagnosis not present

## 2016-05-10 DIAGNOSIS — H2513 Age-related nuclear cataract, bilateral: Secondary | ICD-10-CM | POA: Diagnosis not present

## 2016-05-15 DIAGNOSIS — M6281 Muscle weakness (generalized): Secondary | ICD-10-CM | POA: Diagnosis not present

## 2016-05-15 DIAGNOSIS — R279 Unspecified lack of coordination: Secondary | ICD-10-CM | POA: Diagnosis not present

## 2016-05-17 DIAGNOSIS — R279 Unspecified lack of coordination: Secondary | ICD-10-CM | POA: Diagnosis not present

## 2016-05-17 DIAGNOSIS — M6281 Muscle weakness (generalized): Secondary | ICD-10-CM | POA: Diagnosis not present

## 2016-05-22 ENCOUNTER — Ambulatory Visit (INDEPENDENT_AMBULATORY_CARE_PROVIDER_SITE_OTHER): Payer: Medicare Other | Admitting: Family Medicine

## 2016-05-22 ENCOUNTER — Telehealth: Payer: Self-pay

## 2016-05-22 ENCOUNTER — Encounter: Payer: Self-pay | Admitting: Family Medicine

## 2016-05-22 VITALS — BP 112/66 | HR 74 | Temp 98.7°F | Ht 67.0 in | Wt 153.6 lb

## 2016-05-22 DIAGNOSIS — Q046 Congenital cerebral cysts: Secondary | ICD-10-CM | POA: Diagnosis not present

## 2016-05-22 DIAGNOSIS — M25511 Pain in right shoulder: Secondary | ICD-10-CM | POA: Diagnosis not present

## 2016-05-22 DIAGNOSIS — D472 Monoclonal gammopathy: Secondary | ICD-10-CM

## 2016-05-22 DIAGNOSIS — R279 Unspecified lack of coordination: Secondary | ICD-10-CM | POA: Diagnosis not present

## 2016-05-22 DIAGNOSIS — M6281 Muscle weakness (generalized): Secondary | ICD-10-CM | POA: Diagnosis not present

## 2016-05-22 DIAGNOSIS — M810 Age-related osteoporosis without current pathological fracture: Secondary | ICD-10-CM | POA: Insufficient documentation

## 2016-05-22 DIAGNOSIS — M25611 Stiffness of right shoulder, not elsewhere classified: Secondary | ICD-10-CM

## 2016-05-22 NOTE — Telephone Encounter (Signed)
Please start the Prolia verification process for this patient. thanks

## 2016-05-22 NOTE — Progress Notes (Signed)
Patient ID: Henry Mays, male   DOB: 10-01-1941, 75 y.o.   MRN: [id number]  Henry Rumps, MD Phone: [phone]  Henry Mays is a 75 y.o. male who presents today for follow-up.  Right shoulder decreased range of motion: Patient notes he has been doing physical therapy and this has improved his range of motion. Notes his shoulder feels less stiff. No pain. Plans to start trying to swing a golf club soon.  Osteoporosis: Patient notes recently had a bone scan that revealed this. States he was due for his second prolia injection on June 19. His only got one shot so far.  MGUS: Followed by [facility] oncology for this. Was evaluated for high protein in his blood and found to have IgG kappa monoclonal gammopathy and an M spike. He is being monitored for this at this time.  Colloid cyst of brain: Patient is followed by neurology at [facility] for this. He's had repeat imaging that showed a stable cyst. Notes he is supposed to go back in January for repeat scan. No numbness, weakness, vision changes, or headaches.  PMH: Former smoker   ROS see history of present illness  Objective  Physical Exam Filed Vitals:   05/22/16 0922  BP: 112/66  Pulse: 74  Temp: 98.7 F (37.1 C)    BP Readings from Last 3 Encounters:  05/22/16 112/66  04/10/16 122/84   Wt Readings from Last 3 Encounters:  05/22/16 153 lb 9.6 oz (69.673 kg)  04/10/16 148 lb 3.2 oz (67.223 kg)    Physical Exam  Constitutional: He is well-developed, well-nourished, and in no distress.  HENT:  Head: Normocephalic and atraumatic.  Right Ear: External ear normal.  Left Ear: External ear normal.  Mouth/Throat: Oropharynx is clear and moist. No oropharyngeal exudate.  Eyes: Conjunctivae are normal. Pupils are equal, round, and reactive to light.  Cardiovascular: Normal rate, regular rhythm and normal heart sounds.   Pulmonary/Chest: Effort normal and breath sounds normal.  Musculoskeletal:  Right shoulder nontender, decreased  range of motion on abduction, left shoulder nontender, full range of motion left shoulder  Neurological: He is alert. Gait normal.  CN 2-12 intact, 5/5 strength in bilateral biceps, triceps, grip, quads, hamstrings, plantar and dorsiflexion, sensation to light touch intact in bilateral UE and LE, normal gait, 2+ patellar reflexes  Skin: Skin is warm and dry. He is not diaphoretic.     Assessment/Plan: Please see individual problem list.  Decreased right shoulder range of motion Improving. Continue physical therapy. Encouraged to start trying to swing a golf club.  MGUS (monoclonal gammopathy of unknown significance) Revealed on blood work found through his neurologist and currently seeing oncology for this. They're continuing to monitor. He will continue to follow with oncology.  Colloid cyst of brain ([place]) Found to be stable on recent imaging. Following with neurology at [facility]. Neurologically intact today. Asymptomatic. Continue to follow with neurology.  Osteoporosis Reports this was found on recent DEXA scan. Has received one injection of prolia. Is due for his second injection. We'll request records. We will order prolia. Creatinine clearance calculated to be 42 and should tolerate Prolia.   We have requested records again from his prior PCP for lab work in prior notes.  Henry Rumps, MD [place]

## 2016-05-22 NOTE — Progress Notes (Signed)
Pre visit review using our clinic review tool, if applicable. No additional management support is needed unless otherwise documented below in the visit note. 

## 2016-05-22 NOTE — Assessment & Plan Note (Signed)
Revealed on blood work found through his neurologist and currently seeing oncology for this. They're continuing to monitor. He will continue to follow with oncology.

## 2016-05-22 NOTE — Assessment & Plan Note (Signed)
Reports this was found on recent DEXA scan. Has received one injection of prolia. Is due for his second injection. We'll request records. We will order prolia. Creatinine clearance calculated to be 42 and should tolerate Prolia.

## 2016-05-22 NOTE — Assessment & Plan Note (Signed)
Improving. Continue physical therapy. Encouraged to start trying to swing a golf club.

## 2016-05-22 NOTE — Telephone Encounter (Signed)
I have electronically submitted pt's info for Prolia insurance verification and will notify you once I have a response. Thank you. °

## 2016-05-22 NOTE — Patient Instructions (Signed)
Nice to see you. Please continue physical therapy for her shoulder. We will order Prolene and call you and thus comes in. If you do not hear anything from [place] in the next week regarding this please give [place] a call. Please continue to follow-up at [facility] as well. If you develop headaches, vision changes, numbness, weakness, or any new or changing symptoms please seek medical attention.

## 2016-05-22 NOTE — Assessment & Plan Note (Signed)
Found to be stable on recent imaging. Following with neurology at [facility]. Neurologically intact today. Asymptomatic. Continue to follow with neurology.

## 2016-05-28 NOTE — Telephone Encounter (Signed)
I have rec'd insurance verification for Proila and Mr. Boughter will have an estimated responsibility of $0.  Please make pt aware this is an estimate and we will not know an exact amt until insurance(s) has/have paid.  I have sent a copy of the summary of benefits to be scanned into pt's chart.    Once pt recs injection, please let me know actual injection date so I can update the Prolia portal.  If you have any questions, please let me know.  Thank you!

## 2016-05-28 NOTE — Telephone Encounter (Signed)
Reviewed information with patient.  Scheduled for 2pm on Thursday to receive the injection

## 2016-05-29 NOTE — Telephone Encounter (Signed)
Ordered injection in [facility].

## 2016-05-31 ENCOUNTER — Ambulatory Visit (INDEPENDENT_AMBULATORY_CARE_PROVIDER_SITE_OTHER): Payer: Medicare Other | Admitting: *Deleted

## 2016-05-31 DIAGNOSIS — M810 Age-related osteoporosis without current pathological fracture: Secondary | ICD-10-CM | POA: Diagnosis not present

## 2016-05-31 MED ORDER — DENOSUMAB 60 MG/ML ~~LOC~~ SOLN
60.0000 mg | Freq: Once | SUBCUTANEOUS | Status: AC
  Administered 2016-05-31: 60 mg via SUBCUTANEOUS

## 2016-05-31 NOTE — Telephone Encounter (Signed)
Pt came in today for Prolia injection

## 2016-06-05 NOTE — Telephone Encounter (Signed)
Please advise shot was given on 6/22. thanks

## 2016-06-06 DIAGNOSIS — D472 Monoclonal gammopathy: Secondary | ICD-10-CM | POA: Diagnosis not present

## 2016-06-25 ENCOUNTER — Telehealth: Payer: Self-pay | Admitting: Family Medicine

## 2016-06-25 DIAGNOSIS — I10 Essential (primary) hypertension: Secondary | ICD-10-CM

## 2016-06-25 NOTE — Telephone Encounter (Signed)
No labs on file ok to fill?

## 2016-06-25 NOTE — Telephone Encounter (Signed)
Left patient detailed message on voicemail.

## 2016-06-25 NOTE — Telephone Encounter (Signed)
Okay to refill. I sent a refill to the pharmacy. He will need to be scheduled for lab work sometime later this week. Lab orders have been placed. Thanks.

## 2016-06-26 NOTE — Telephone Encounter (Signed)
Pt is scheduled to come in for lab work.

## 2016-06-28 ENCOUNTER — Other Ambulatory Visit (INDEPENDENT_AMBULATORY_CARE_PROVIDER_SITE_OTHER): Payer: Medicare Other

## 2016-06-28 DIAGNOSIS — I10 Essential (primary) hypertension: Secondary | ICD-10-CM

## 2016-06-28 LAB — BASIC METABOLIC PANEL
BUN: 28 mg/dL — ABNORMAL HIGH (ref 6–23)
CALCIUM: 9.1 mg/dL (ref 8.4–10.5)
CO2: 27 meq/L (ref 19–32)
CREATININE: 1.2 mg/dL (ref 0.40–1.50)
Chloride: 99 mEq/L (ref 96–112)
GFR: 62.79 mL/min (ref >60.00)
GLUCOSE: 104 mg/dL — AB (ref 70–99)
POTASSIUM: 4.8 meq/L (ref 3.5–5.1)
SODIUM: 134 meq/L — AB (ref 135–145)

## 2016-07-23 ENCOUNTER — Telehealth: Payer: Self-pay | Admitting: Family Medicine

## 2016-07-23 NOTE — Telephone Encounter (Signed)
Spoke with the wife she wanted to know if we carried the Pneumonia and Hep B vaccines in our office, I confirmed they are both due for there 3rd injections and first pneumonia. Thanks  They are seeing PCP in a few days.

## 2016-07-23 NOTE — Telephone Encounter (Signed)
Pt wife has a question regarding Hep B vaccine?   Call pt wife @ [phone]. Thank you!

## 2016-07-24 ENCOUNTER — Encounter: Payer: Self-pay | Admitting: Family Medicine

## 2016-07-24 ENCOUNTER — Ambulatory Visit (INDEPENDENT_AMBULATORY_CARE_PROVIDER_SITE_OTHER): Payer: Medicare Other | Admitting: Family Medicine

## 2016-07-24 VITALS — BP 118/68 | HR 64 | Temp 98.2°F | Resp 12 | Ht 67.0 in | Wt 155.0 lb

## 2016-07-24 DIAGNOSIS — I10 Essential (primary) hypertension: Secondary | ICD-10-CM | POA: Diagnosis not present

## 2016-07-24 DIAGNOSIS — Z23 Encounter for immunization: Secondary | ICD-10-CM

## 2016-07-24 DIAGNOSIS — M25611 Stiffness of right shoulder, not elsewhere classified: Secondary | ICD-10-CM

## 2016-07-24 DIAGNOSIS — D472 Monoclonal gammopathy: Secondary | ICD-10-CM

## 2016-07-24 DIAGNOSIS — M25511 Pain in right shoulder: Secondary | ICD-10-CM | POA: Diagnosis not present

## 2016-07-24 NOTE — Assessment & Plan Note (Signed)
At goal. Continue lisinopril.  

## 2016-07-24 NOTE — Patient Instructions (Signed)
Nice to see you. Please continue to do the exercises for your shoulder. We will have you check on which pneumonia vaccine you need and we will send request to your prior PCPs office as well.

## 2016-07-24 NOTE — Progress Notes (Signed)
  Tommi Rumps, MD Phone: [phone]  Henry Mays is a 75 y.o. male who presents today for follow-up.  HYPERTENSION  Disease Monitoring  Home BP Monitoring not checking. Chest pain- no    Dyspnea- no Medications  Compliance-  taking lisinopril.  Edema- no  Right shoulder decreased range of motion: Patient notes this is quite a bit improved from previously. No pain. Not as stiff as previously. Has been doing exercises on his own now as he was advised by the physical therapist. Has not quite gotten back to swinging golf club yet.  MGUS: Has continue to follow with oncology at [facility]. Per discussion with the patient sounds as though they recommended a bone marrow biopsy though patient deferred at that time opting to wait until his blood work comes back. Blood work reviewed today likely indicating MGUS. Would need biopsy to confirm.   PMH: Former smoker   ROS see history of present illness  Objective  Physical Exam Vitals:   07/24/16 0833  BP: 118/68  Pulse: 64  Resp: 12  Temp: 98.2 F (36.8 C)    BP Readings from Last 3 Encounters:  07/24/16 118/68  05/22/16 112/66  04/10/16 122/84   Wt Readings from Last 3 Encounters:  07/24/16 155 lb (70.3 kg)  05/22/16 153 lb 9.6 oz (69.7 kg)  04/10/16 148 lb 3.2 oz (67.2 kg)    Physical Exam  Constitutional: No distress.  Cardiovascular: Normal rate, regular rhythm and normal heart sounds.   Pulmonary/Chest: Effort normal and breath sounds normal.  Musculoskeletal:  Right shoulder with decreased range of motion with active and passive internal range of motion significantly reduced, active and passive external range of motion partially reduced and active and passive abduction to only about 90 Left shoulder with full range of motion Bilateral shoulders with no tenderness or swelling  Neurological: He is alert. Gait normal.  Skin: He is not diaphoretic.     Assessment/Plan: Please see individual problem list.  Essential  hypertension At goal. Continue lisinopril.  MGUS (monoclonal gammopathy of unknown significance) Continue to follow with oncology at [facility]. Review of lab work recently shows likely MGUS and may need a bone marrow biopsy to confirm.  Decreased right shoulder range of motion Slowly improving. Continue exercises given to him by physical therapy to be. Advised to try to swing a golf club as soon as possible.   Orders Placed This Encounter  Procedures  . Hepatitis B vaccine adult IM    No orders of the defined types were placed in this encounter.   # Healthcare maintenance: Patient due for her third hepatitis B vaccination. This is given today. He is due for a pneumonia vaccine though he is unsure which one. He will check his records at home and we will fax release of information form to his prior physician's office.  Tommi Rumps, MD [place]

## 2016-07-24 NOTE — Progress Notes (Signed)
Pre visit review using our clinic review tool, if applicable. No additional management support is needed unless otherwise documented below in the visit note. 

## 2016-07-24 NOTE — Assessment & Plan Note (Signed)
Slowly improving. Continue exercises given to him by physical therapy to be. Advised to try to swing a golf club as soon as possible.

## 2016-07-24 NOTE — Assessment & Plan Note (Signed)
Continue to follow with oncology at [facility]. Review of lab work recently shows likely MGUS and may need a bone marrow biopsy to confirm.

## 2016-07-27 ENCOUNTER — Telehealth: Payer: Self-pay | Admitting: Family Medicine

## 2016-07-27 ENCOUNTER — Emergency Department
Admission: EM | Admit: 2016-07-27 | Discharge: 2016-07-27 | Disposition: A | Discharge status: Home / Self Care | Payer: Medicare Other | Attending: Emergency Medicine | Admitting: Emergency Medicine

## 2016-07-27 ENCOUNTER — Telehealth: Payer: Self-pay | Admitting: *Deleted

## 2016-07-27 ENCOUNTER — Encounter: Payer: Self-pay | Admitting: Emergency Medicine

## 2016-07-27 DIAGNOSIS — L03114 Cellulitis of left upper limb: Secondary | ICD-10-CM | POA: Insufficient documentation

## 2016-07-27 DIAGNOSIS — I10 Essential (primary) hypertension: Secondary | ICD-10-CM | POA: Diagnosis not present

## 2016-07-27 DIAGNOSIS — Z87891 Personal history of nicotine dependence: Secondary | ICD-10-CM | POA: Insufficient documentation

## 2016-07-27 DIAGNOSIS — Z79899 Other long term (current) drug therapy: Secondary | ICD-10-CM | POA: Insufficient documentation

## 2016-07-27 DIAGNOSIS — L03116 Cellulitis of left lower limb: Secondary | ICD-10-CM | POA: Diagnosis not present

## 2016-07-27 LAB — CBC WITH DIFFERENTIAL/PLATELET
Basophils Absolute: 0.1 10*3/uL (ref 0–0.1)
Basophils Relative: 1 %
EOS ABS: 0 10*3/uL (ref 0–0.7)
Eosinophils Relative: 1 %
HCT: 38 % — ABNORMAL LOW (ref 40.0–52.0)
HEMOGLOBIN: 13.4 g/dL (ref 13.0–18.0)
LYMPHS ABS: 1.5 10*3/uL (ref 1.0–3.6)
Lymphocytes Relative: 19 %
MCH: 32.9 pg (ref 26.0–34.0)
MCHC: 35.2 g/dL (ref 32.0–36.0)
MCV: 93.6 fL (ref 80.0–100.0)
MONOS PCT: 15 %
Monocytes Absolute: 1.1 10*3/uL — ABNORMAL HIGH (ref 0.2–1.0)
NEUTROS PCT: 64 %
Neutro Abs: 4.9 10*3/uL (ref 1.4–6.5)
Platelets: 234 10*3/uL (ref 150–440)
RBC: 4.06 MIL/uL — ABNORMAL LOW (ref 4.40–5.90)
RDW: 13.9 % (ref 11.5–14.5)
WBC: 7.6 10*3/uL (ref 3.8–10.6)

## 2016-07-27 LAB — BASIC METABOLIC PANEL
Anion gap: 9 (ref 5–15)
BUN: 19 mg/dL (ref 6–20)
CHLORIDE: 101 mmol/L (ref 101–111)
CO2: 25 mmol/L (ref 22–32)
CREATININE: 1.02 mg/dL (ref 0.61–1.24)
Calcium: 9.6 mg/dL (ref 8.9–10.3)
GFR calc Af Amer: >60 mL/min (ref >60)
GFR calc non Af Amer: >60 mL/min (ref >60)
GLUCOSE: 116 mg/dL — AB (ref 65–99)
Potassium: 3.9 mmol/L (ref 3.5–5.1)
SODIUM: 135 mmol/L (ref 135–145)

## 2016-07-27 MED ORDER — CLINDAMYCIN PHOSPHATE 600 MG/50ML IV SOLN
600.0000 mg | Freq: Once | INTRAVENOUS | Status: AC
  Administered 2016-07-27: 600 mg via INTRAVENOUS
  Filled 2016-07-27: qty 50

## 2016-07-27 MED ORDER — CLINDAMYCIN HCL 300 MG PO CAPS: 300.0000 mg | ORAL_CAPSULE | Freq: Four times a day (QID) | ORAL | 0 refills | Status: DC

## 2016-07-27 MED ORDER — SODIUM CHLORIDE 0.9 % IV BOLUS (SEPSIS)
1000.0000 mL | Freq: Once | INTRAVENOUS | Status: AC
  Administered 2016-07-27: 1000 mL via INTRAVENOUS

## 2016-07-27 NOTE — ED Provider Notes (Signed)
[facility] Emergency Department Provider Note  ____________________________________________  Time seen: Approximately 10:45 AM  I have reviewed the triage vital signs and the nursing notes.   HISTORY  Chief Complaint Cellulitis    HPI Henry Mays is a 75 y.o. male who presents to emergency department complaining of swelling and redness to his left hand. Patient states that he was attaching the lesions to his dog for a walk yesterday morning when he accidentally scraped his hand against the dog's teeth. Dog is up-to-date on all immunizations. Patient's last tetanus was less than 5 years ago. Patient reports that initially there was a small laceration to the proximal edge of the MCP joint second digit left hand. Patient reports that over the intervening period He has developed increasing erythema and edema.Patient reports mild pain with making a fist but is able to completely make a fist. Patient denies any numbness or tingling in any of his digits. He denies any loss of range of motion to any digit. No systemic complaints of fevers or chills, chest pain, shortness of breath, abdominal pain, nausea or vomiting. Patient has not had any medications prior to arrival.   Past Medical History:  Diagnosis Date  . Arthritis   . Basal cell carcinoma   . Chickenpox   . Kidney stones     Patient Active Problem List   Diagnosis Date Noted  . Colloid cyst of brain ([place]) 05/22/2016  . Osteoporosis 05/22/2016  . Decreased right shoulder range of motion 04/10/2016  . Essential hypertension 04/10/2016  . MGUS (monoclonal gammopathy of unknown significance) 04/10/2016    Past Surgical History:  Procedure Laterality Date  . BASAL CELL CARCINOMA EXCISION    . HERNIA REPAIR      Prior to Admission medications   Medication Sig Start Date End Date Taking? Authorizing Provider  clindamycin (CLEOCIN) 300 MG capsule Take 1 capsule (300 mg total) by mouth 4 (four) times  daily. 07/27/16   Charline Bills Cuthriell, PA-C  dexamethasone 0.5 MG/5ML elixir  03/27/16   Historical Provider, MD  lisinopril (PRINIVIL,ZESTRIL) 5 MG tablet TAKE 1 TABLET BY MOUTH DAILY 06/25/16   Leone Haven, MD    Allergies Review of patient's allergies indicates no known allergies.  Family History  Problem Relation Age of Onset  . Arthritis    . Stroke    . Hypertension    . Leukemia Father     Social History Social History  Substance Use Topics  . Smoking status: Former Research scientist (life sciences)  . Smokeless tobacco: Not on file  . Alcohol use 6.0 oz/week    10 Standard drinks or equivalent per week     Review of Systems  Constitutional: No fever/chills Eyes: No visual changes. No discharge Cardiovascular: no chest pain. Respiratory: no cough. No SOB. Gastrointestinal: No abdominal pain.  No nausea, no vomiting.   Musculoskeletal: Negative for musculoskeletal pain. Skin: Erythema and edema to dorsal aspect of left hand Neurological: Negative for headaches, focal weakness or numbness. 10-point ROS otherwise negative.  ____________________________________________   PHYSICAL EXAM:  VITAL SIGNS: ED Triage Vitals  Enc Vitals Group     BP 07/27/16 1026 (!) 148/72     Pulse Rate 07/27/16 1026 85     Resp 07/27/16 1026 18     Temp 07/27/16 1026 98.2 F (36.8 C)     Temp Source 07/27/16 1026 Oral     SpO2 07/27/16 1026 100 %     Weight 07/27/16 1027 150 lb (68 kg)  Height 07/27/16 1027 5\' 8"  (1.727 m)     Head Circumference --      Peak Flow --      Pain Score 07/27/16 1025 2     Pain Loc --      Pain Edu? --      Excl. in [place]? --      Constitutional: Alert and oriented. Well appearing and in no acute distress. Eyes: Conjunctivae are normal. PERRL. EOMI. Head: Atraumatic. Cardiovascular: Normal rate, regular rhythm. Normal S1 and S2.  Good peripheral circulation. Respiratory: Normal respiratory effort without tachypnea or retractions. Lungs CTAB. Good air entry to the  bases with no decreased or absent breath sounds. Musculoskeletal: Full range of motion to all extremities. No gross deformities appreciated. Neurologic:  Normal speech and language. No gross focal neurologic deficits are appreciated.  Skin:  Skin is warm, dry and intact. No rash noted. Small puncture wound noted to proximal MCP joint dorsal aspect of the left hand second digit. Surrounding erythema and edema. Compartments are soft. Full range of motion 5 digits. Sensation intact 5 digits. Patient is missing distal phalanx of second digit. Psychiatric: Mood and affect are normal. Speech and behavior are normal. Patient exhibits appropriate insight and judgement.   ____________________________________________   LABS (all labs ordered are listed, but only abnormal results are displayed)  Labs Reviewed  CBC WITH DIFFERENTIAL/PLATELET - Abnormal; Notable for the following:       Result Value   RBC 4.06 (*)    HCT 38.0 (*)    Monocytes Absolute 1.1 (*)    All other components within normal limits  BASIC METABOLIC PANEL - Abnormal; Notable for the following:    Glucose, Bld 116 (*)    All other components within normal limits   ____________________________________________  EKG   ____________________________________________  RADIOLOGY   No results found.  ____________________________________________    PROCEDURES  Procedure(s) performed:    Procedures    Medications  sodium chloride 0.9 % bolus 1,000 mL (1,000 mLs Intravenous New Bag/Given 07/27/16 1058)  clindamycin (CLEOCIN) IVPB 600 mg (600 mg Intravenous New Bag/Given 07/27/16 1112)     ____________________________________________   INITIAL IMPRESSION / ASSESSMENT AND PLAN / ED COURSE  Pertinent labs & imaging results that were available during my care of the patient were reviewed by me and considered in my medical decision making (see chart for details).  Clinical Course    Patient's diagnosis is  consistent with Cellulitis to the right hand. Labs returned with reassuring results. Patient is given IV clindamycin here in the emergency department.. Patient will be discharged home with prescriptions for oral clindamycin. Patient is encouraged to use probiotics of taking this antibiotic. Patient is given strict precautions to return for increasing symptoms to include increasing erythema, edema, pain. Patient verbalizes understanding and verbalizes compliance with same.. Patient is to follow up with primary care as needed or otherwise directed. Patient is given ED precautions to return to the ED for any worsening or new symptoms.     ____________________________________________  FINAL CLINICAL IMPRESSION(S) / ED DIAGNOSES  Final diagnoses:  Cellulitis of left hand      NEW MEDICATIONS STARTED DURING THIS VISIT:  New Prescriptions   CLINDAMYCIN (CLEOCIN) 300 MG CAPSULE    Take 1 capsule (300 mg total) by mouth 4 (four) times daily.        This chart was dictated using voice recognition software/Dragon. Despite best efforts to proofread, errors can occur which can change the meaning. Any change  was purely unintentional.    Darletta Moll, PA-C 07/27/16 1241    Harvest Dark, MD 07/27/16 [phone]

## 2016-07-27 NOTE — ED Triage Notes (Signed)
Pt presents with redness and swelling to left hand and wrist. Pt reports he bent down yesterday to pet his dog and his hand scraped up against one of her teeth. Pt reports dog's immunizations are current. Pt reports feeling some tightness to his hand when he bends it.

## 2016-07-27 NOTE — Telephone Encounter (Signed)
Patient Name: MAHYAR VINK  DOB: Nov 06, 1941    Initial Comment Caller states he has swollen left hand, accidentally punctured by dog yesterday   Nurse Assessment  Nurse: Mallie Mussel, RN, Alveta Heimlich Date/Time (Eastern Time): 07/27/2016 9:51:48 AM  Confirm and document reason for call. If symptomatic, describe symptoms. You must click the next button to save text entered. ---Caller states that he was bitten by his dog yesterday on his left hand. The hand is swollen. He cannot tell if the skin is red, he is color blind. Denies fever. He cannot close his fist completely. He rates his pain as 4 on 0-10 scale. His dog is up to date on his shots.  Has the patient traveled out of the country within the last 30 days? ---No  Does the patient have any new or worsening symptoms? ---Yes  Will a triage be completed? ---Yes  Related visit to physician within the last 2 weeks? ---No  Does the PT have any chronic conditions? (i.e. diabetes, asthma, etc.) ---Yes  List chronic conditions. ---HTN  Is this a behavioral health or substance abuse call? ---No     Guidelines    Guideline Title Affirmed Question Affirmed Notes  Animal Bite [1] Puncture wound or small cut AND [2] on hands or genitals    Final Disposition User   See Physician within 4 Hours (or PCP triage) Mallie Mussel, RN, [place] states that he is new to the area. If [facility] does not have any appointments, he will just go to the ER to be seen. No appointments available at [facility]. Offered to check other offices, but he declined. He will go to [facility].   Referrals  REFERRED TO PCP OFFICE   Disagree/Comply: Comply

## 2016-07-27 NOTE — Telephone Encounter (Signed)
Patient was walking the dog, she sustained broken skin when his hand hit his dos's teeth. He now has  Severe swelling and inflammation.-per patient statement  Transferred to nurse line

## 2016-07-27 NOTE — Telephone Encounter (Signed)
FYI

## 2016-07-27 NOTE — Telephone Encounter (Signed)
Noted  

## 2016-07-27 NOTE — ED Notes (Signed)
E-signature box not working. Pt verbalized understanding of discharge instructions and denied questions. 

## 2016-07-31 ENCOUNTER — Ambulatory Visit (INDEPENDENT_AMBULATORY_CARE_PROVIDER_SITE_OTHER): Payer: Medicare Other

## 2016-07-31 DIAGNOSIS — Z23 Encounter for immunization: Secondary | ICD-10-CM

## 2016-07-31 NOTE — Progress Notes (Signed)
Patient came in for Pneumonia 23 injection.  Received in Left deltoid.  Patient tolerated well.    Patient was concerned regarding a dog bite on left hand.  He was seen in the ED for cellutis in the hand due to the bite and was given IV clindamycin and then Oral Clindamycin.  I asked Dr. Lacinda Axon to look at hand to reassure patient that it was in the healing phases and that no additional treatment needed.  Dr. Lacinda Axon in to see patient, advised of the above.  Patient agreed.  Thanks

## 2016-08-02 ENCOUNTER — Telehealth: Payer: Self-pay

## 2016-08-02 NOTE — Telephone Encounter (Signed)
Patient would like a refill of Chlortromoaz for hiccups. Patient had prescription through past provider. Please advise.

## 2016-08-03 NOTE — Telephone Encounter (Signed)
Left message to return call back. Patient needs to schedule 41min FU with a provider to receive medication for hiccups.

## 2016-08-03 NOTE — Telephone Encounter (Signed)
Patient will need to be seen regarding this medication.

## 2016-08-07 ENCOUNTER — Encounter: Payer: Self-pay | Admitting: Family Medicine

## 2016-08-07 ENCOUNTER — Ambulatory Visit (INDEPENDENT_AMBULATORY_CARE_PROVIDER_SITE_OTHER): Payer: Medicare Other | Admitting: Family Medicine

## 2016-08-07 DIAGNOSIS — L03114 Cellulitis of left upper limb: Secondary | ICD-10-CM | POA: Diagnosis not present

## 2016-08-07 DIAGNOSIS — R066 Hiccough: Secondary | ICD-10-CM | POA: Diagnosis not present

## 2016-08-07 DIAGNOSIS — L0390 Cellulitis, unspecified: Secondary | ICD-10-CM | POA: Insufficient documentation

## 2016-08-07 MED ORDER — CHLORPROMAZINE HCL 25 MG PO TABS: 25.0000 mg | ORAL_TABLET | Freq: Three times a day (TID) | ORAL | 0 refills | Status: DC | PRN

## 2016-08-07 NOTE — Telephone Encounter (Signed)
Left message for patient to return call back.  

## 2016-08-07 NOTE — Progress Notes (Signed)
  Tommi Rumps, MD Phone: [phone]  Henry Mays is a 75 y.o. male who presents today for follow-up.  Patient was seen in the ED about a week ago for cellulitis. Notes he got his hand caught on one of the teeth of his dog as he moved his hand past his dog's mouth. Reports was not a bite. Notes the next day his hand was swollen and red. Was given IV clindamycin in the ED and just finished his oral clindamycin 2 days ago. Notes no redness or swelling. Notes it is healing well.  Patient notes a history of intermittent prolonged episodes of hiccups going back a number of years. Was placed on Thorazine by his prior PCP. Notes he rarely takes this and that the prescription for 12 tablets prescribed one year ago lasted a whole year. Notes he will wait until he has had hiccups for fairly long period of time prior to taking the Thorazine. Notes it does help relieve his hiccups. He tries multiple home remedies prior to taking the Thorazine. He notes no side effects with this medication.  PMH: Former smoker   ROS see history of present illness  Objective  Physical Exam Vitals:   08/07/16 1116  BP: 116/66  Pulse: 77  Temp: 98.4 F (36.9 C)    BP Readings from Last 3 Encounters:  08/07/16 116/66  07/27/16 129/70  07/24/16 118/68   Wt Readings from Last 3 Encounters:  08/07/16 152 lb 6.4 oz (69.1 kg)  07/27/16 150 lb (68 kg)  07/24/16 155 lb (70.3 kg)    Physical Exam  Constitutional: No distress.  Cardiovascular: Normal rate, regular rhythm and normal heart sounds.   Pulmonary/Chest: Effort normal and breath sounds normal.  Musculoskeletal:  Left hand dorsal aspect with small scab with no surrounding erythema or swelling or tenderness  Neurological: He is alert. Gait normal.  Skin: Skin is warm and dry. He is not diaphoretic.     Assessment/Plan: Please see individual problem list.  Intractable hiccups Patient with a history of intermittent intractable hiccups. Has been  treated with Thorazine in the past with good benefit. Does not take this very frequently. Discussed potential side effects and is given information regarding this medication. Refill was provided.  Cellulitis This issue has resolved with antibiotic therapy. He will monitor for recurrence.   No orders of the defined types were placed in this encounter.   Meds ordered this encounter  Medications  . DISCONTD: chlorproMAZINE (THORAZINE) 25 MG tablet    Sig: Take 25 mg by mouth 3 (three) times daily as needed.  . chlorproMAZINE (THORAZINE) 25 MG tablet    Sig: Take 1 tablet (25 mg total) by mouth 3 (three) times daily as needed.    Dispense:  12 tablet    Refill:  0    Tommi Rumps, MD [place]

## 2016-08-07 NOTE — Progress Notes (Signed)
Pre visit review using our clinic review tool, if applicable. No additional management support is needed unless otherwise documented below in the visit note. 

## 2016-08-07 NOTE — Assessment & Plan Note (Signed)
Patient with a history of intermittent intractable hiccups. Has been treated with Thorazine in the past with good benefit. Does not take this very frequently. Discussed potential side effects and is given information regarding this medication. Refill was provided.

## 2016-08-07 NOTE — Assessment & Plan Note (Signed)
This issue has resolved with antibiotic therapy. He will monitor for recurrence.

## 2016-08-07 NOTE — Patient Instructions (Signed)
Nice to see you. I refilled your Thorazine. Please review the drug information. If this does not help for your hiccups please let us know. If you develop any side effects or need this more frequently please let us know. Please monitor the area of your dog bite and if you develop redness or swelling please let us know.

## 2016-08-10 NOTE — Telephone Encounter (Signed)
Will close until patient returns the call, needs an appt for refill on medication mentioned. thanks

## 2016-08-20 ENCOUNTER — Ambulatory Visit (INDEPENDENT_AMBULATORY_CARE_PROVIDER_SITE_OTHER): Payer: Medicare Other | Admitting: Family Medicine

## 2016-08-20 ENCOUNTER — Other Ambulatory Visit: Payer: Self-pay | Admitting: Family Medicine

## 2016-08-20 VITALS — BP 108/64 | HR 106 | Temp 98.3°F | Wt 154.6 lb

## 2016-08-20 DIAGNOSIS — R197 Diarrhea, unspecified: Secondary | ICD-10-CM

## 2016-08-20 DIAGNOSIS — R509 Fever, unspecified: Secondary | ICD-10-CM | POA: Diagnosis not present

## 2016-08-20 DIAGNOSIS — A084 Viral intestinal infection, unspecified: Secondary | ICD-10-CM | POA: Insufficient documentation

## 2016-08-20 DIAGNOSIS — E871 Hypo-osmolality and hyponatremia: Secondary | ICD-10-CM

## 2016-08-20 LAB — COMPREHENSIVE METABOLIC PANEL
ALBUMIN: 3.5 g/dL (ref 3.5–5.2)
ALK PHOS: 48 U/L (ref 39–117)
ALT: 34 U/L (ref 0–53)
AST: 38 U/L — ABNORMAL HIGH (ref 0–37)
BILIRUBIN TOTAL: 1.2 mg/dL (ref 0.2–1.2)
BUN: 27 mg/dL — ABNORMAL HIGH (ref 6–23)
CO2: 25 mEq/L (ref 19–32)
Calcium: 8.7 mg/dL (ref 8.4–10.5)
Chloride: 95 mEq/L — ABNORMAL LOW (ref 96–112)
Creatinine, Ser: 1.31 mg/dL (ref 0.40–1.50)
GFR: 56.72 mL/min — AB (ref >60.00)
GLUCOSE: 111 mg/dL — AB (ref 70–99)
POTASSIUM: 4.2 meq/L (ref 3.5–5.1)
Sodium: 129 mEq/L — ABNORMAL LOW (ref 135–145)
TOTAL PROTEIN: 7.9 g/dL (ref 6.0–8.3)

## 2016-08-20 LAB — POCT INFLUENZA A/B
INFLUENZA B, POC: NEGATIVE
Influenza A, POC: NEGATIVE

## 2016-08-20 NOTE — Patient Instructions (Signed)
Nice to see you. Your symptoms are likely related to a viral illness. You should stay well hydrated and drink plenty of fluids. We will check lab work today to check on your kidney function and electrolytes. You can try Tylenol over-the-counter for any discomfort. If you develop abdominal pain, blood in your stool, persistent fevers, or any new or changing symptoms please seek medical attention immediately.

## 2016-08-20 NOTE — Progress Notes (Signed)
  Tommi Rumps, MD Phone: [phone]  Henry Mays is a 75 y.o. male who presents today for same-day visit.  Onset of symptoms Saturday. Has had chills, body aches, and diarrhea. Fever little over 100F. Some nausea. No blood in his stool. No vomiting. No abdominal pain. No upper respiratory symptoms. Has been trying to drink plenty fluids. Still urinating well. Has not gotten a flu shot yet.  of present illness  Objective  Physical Exam Vitals:   08/20/16 1025  BP: 108/64  Pulse: (!) 106  Temp: 98.3 F (36.8 C)    BP Readings from Last 3 Encounters:  08/20/16 108/64  08/07/16 116/66  07/27/16 129/70   Wt Readings from Last 3 Encounters:  08/20/16 154 lb 9.6 oz (70.1 kg)  08/07/16 152 lb 6.4 oz (69.1 kg)  07/27/16 150 lb (68 kg)    Physical Exam  Constitutional: No distress.  HENT:  Head: Normocephalic and atraumatic.  Normal TMs bilaterally, mild posterior oropharyngeal erythema, no exudate, moist mucous membranes  Eyes: Conjunctivae are normal. Pupils are equal, round, and reactive to light.  Cardiovascular: Normal rate, regular rhythm and normal heart sounds.   Pulmonary/Chest: Effort normal and breath sounds normal.  Abdominal: Soft. Bowel sounds are normal. He exhibits no distension. There is no tenderness. There is no rebound and no guarding.  Neurological: He is alert. Gait normal.  Skin: Skin is warm and dry. He is not diaphoretic.     Assessment/Plan: Please see individual problem list.  Viral gastroenteritis Symptoms most consistent with viral illness. Benign abdominal exam. Negative rapid flu today. Slightly tachycardic likely related to illness. We will check a CMP to evaluate his kidney function and electrolytes. Discussed staying well-hydrated. Given return precautions.   Orders Placed This Encounter  Procedures  . Comp Met (CMET)  . POCT Influenza A/B    Tommi Rumps, MD [place]

## 2016-08-20 NOTE — Progress Notes (Signed)
Pre visit review using our clinic review tool, if applicable. No additional management support is needed unless otherwise documented below in the visit note. 

## 2016-08-20 NOTE — Assessment & Plan Note (Signed)
Symptoms most consistent with viral illness. Benign abdominal exam. Negative rapid flu today. Slightly tachycardic likely related to illness. We will check a CMP to evaluate his kidney function and electrolytes. Discussed staying well-hydrated. Given return precautions.

## 2016-08-21 ENCOUNTER — Telehealth: Payer: Self-pay | Admitting: Family Medicine

## 2016-08-21 ENCOUNTER — Other Ambulatory Visit (INDEPENDENT_AMBULATORY_CARE_PROVIDER_SITE_OTHER): Payer: Medicare Other

## 2016-08-21 DIAGNOSIS — E871 Hypo-osmolality and hyponatremia: Secondary | ICD-10-CM | POA: Diagnosis not present

## 2016-08-21 DIAGNOSIS — E860 Dehydration: Secondary | ICD-10-CM

## 2016-08-21 LAB — BASIC METABOLIC PANEL
BUN: 30 mg/dL — AB (ref 6–23)
CALCIUM: 8.5 mg/dL (ref 8.4–10.5)
CO2: 25 meq/L (ref 19–32)
CREATININE: 1.44 mg/dL (ref 0.40–1.50)
Chloride: 95 mEq/L — ABNORMAL LOW (ref 96–112)
GFR: 50.85 mL/min — AB (ref >60.00)
GLUCOSE: 101 mg/dL — AB (ref 70–99)
Potassium: 4.6 mEq/L (ref 3.5–5.1)
Sodium: 128 mEq/L — ABNORMAL LOW (ref 135–145)

## 2016-08-21 NOTE — Progress Notes (Signed)
Patient came in for lab draw today. Henry Mays was taking of the band aid from draw from yesterday. When taking the band aid off patient skin tore a little. I had Dr. Caryl Bis look at the skin tear. I then placed a nonstick telfa pad on patients wound then taped with paper tape. I advised to keep clean and if he has any problems to call the office.

## 2016-08-21 NOTE — Telephone Encounter (Signed)
Called and spoke with the patient regarding his lab work. His sodium is minimally decreased from previously and his creatinine is slightly worse than previously. He has continued to have diarrhea since our visit yesterday. He reports he has not been eating or drinking like he should. He denies nausea, vomiting, confusion, imbalance, or any other symptoms. I discussed with the patient that he is on the verge of needing IV fluids. Discussed that we could attempt to get IV fluids today, though if he would prefer given that he feels ok with the exception of the diarrhea that he could try one additional day to hydrate at home. Advised that he needs to take in plenty of liquids which can include water, gatorade, popsicles, or jello, or any similar product. Advised that he should hold off on taking his lisinopril at this time given his dehydration and should not take it until we confirm that he is no longer dehydrated. Discussed that if he wanted to hydrate at home we would need to recheck his lab work tomorrow. He would like to proceed with hydration at home and I will have a CMA contact the patient to get him set up for a lab visit tomorrow to recheck his lab work. I did discuss if there was not improvement in his hydration status tomorrow we would have to proceed with IV hydration.

## 2016-08-21 NOTE — Progress Notes (Signed)
Patient had a skin tear over left lateral antecubital fossa with mild bleeding. This was nontender. Discussed with patient that we would wrap this area with a clean dressing and he should monitor for bleeding and signs of infection. Discussed that there is erythema, warmth, drainage, persistent bleeding, or fevers he should be reevaluated.  Tommi Rumps, M.D.

## 2016-08-21 NOTE — Telephone Encounter (Signed)
Pt was advised of Dr.Sonnenberg. Pt scheduled for 2 p.m. 08/22/16. Pt gave verbal understanding

## 2016-08-22 ENCOUNTER — Telehealth: Payer: Self-pay | Admitting: Family Medicine

## 2016-08-22 ENCOUNTER — Other Ambulatory Visit (INDEPENDENT_AMBULATORY_CARE_PROVIDER_SITE_OTHER): Payer: Medicare Other

## 2016-08-22 DIAGNOSIS — E860 Dehydration: Secondary | ICD-10-CM | POA: Diagnosis not present

## 2016-08-22 LAB — BASIC METABOLIC PANEL
BUN: 28 mg/dL — AB (ref 6–23)
CALCIUM: 8.2 mg/dL — AB (ref 8.4–10.5)
CHLORIDE: 96 meq/L (ref 96–112)
CO2: 27 mEq/L (ref 19–32)
CREATININE: 1.41 mg/dL (ref 0.40–1.50)
GFR: 52.1 mL/min — AB (ref >60.00)
Glucose, Bld: 106 mg/dL — ABNORMAL HIGH (ref 70–99)
Potassium: 3.9 mEq/L (ref 3.5–5.1)
Sodium: 128 mEq/L — ABNORMAL LOW (ref 135–145)

## 2016-08-22 NOTE — Telephone Encounter (Signed)
Called patient with lab results. Informed him that his labs were stable from previously. Sodium stable. Creatinine minimally improved. He notes his stools are still loose though are more formed than previously. No abdominal pain. Notes no vomiting. Minimal if any nausea. No confusion or imbalance or headaches. He is drinking plenty of fluids with water and powerade. Patient with moderate hyponatremia stable from previously. Likely related to diarrhea and dehydration. Asymptomatic with regards to hyponatremia at this time. Discussed that he is still borderline for need of IV fluids. And given that his labs of stabilized he could continue oral hydration at home. I encouraged significant oral hydration. We will recheck labs on Friday. If still having loose stools at that time we will check stool studies. Advised that if he develops any of the above-mentioned symptoms he should seek medical attention immediately.

## 2016-08-23 NOTE — Telephone Encounter (Signed)
Scheduled lab appointment for Friday.

## 2016-08-24 ENCOUNTER — Telehealth: Payer: Self-pay | Admitting: Family Medicine

## 2016-08-24 ENCOUNTER — Other Ambulatory Visit (INDEPENDENT_AMBULATORY_CARE_PROVIDER_SITE_OTHER): Payer: Medicare Other

## 2016-08-24 DIAGNOSIS — E860 Dehydration: Secondary | ICD-10-CM

## 2016-08-24 LAB — BASIC METABOLIC PANEL
BUN: 13 mg/dL (ref 6–23)
CALCIUM: 8.4 mg/dL (ref 8.4–10.5)
CO2: 27 meq/L (ref 19–32)
Chloride: 100 mEq/L (ref 96–112)
Creatinine, Ser: 0.98 mg/dL (ref 0.40–1.50)
GFR: 79.28 mL/min (ref >60.00)
Glucose, Bld: 142 mg/dL — ABNORMAL HIGH (ref 70–99)
Potassium: 4 mEq/L (ref 3.5–5.1)
SODIUM: 133 meq/L — AB (ref 135–145)

## 2016-08-24 NOTE — Telephone Encounter (Signed)
Called patient and discuss labs. They're quite a bit improved. He has not had anymore diarrhea over the last several days. Has not had a bowel movement since yesterday. He is urinating fine. Discussed monitoring for bowel movements over the next several days. If he develops abdominal pain, nausea, vomiting, or any new symptoms or be evaluated again.

## 2016-08-25 ENCOUNTER — Telehealth: Payer: Self-pay | Admitting: Family Medicine

## 2016-08-25 NOTE — Telephone Encounter (Signed)
Received a call from team health regarding my patient. She connected me to speak with the patient given that she was advising him to be seen tonight and he was hesitant to do this. The patient and his wife report today he has had fever of 102F earlier this afternoon though after aspirin it brought this down to 101F. I saw him earlier this week for diarrhea presumed to be viral illness. He has been hydrating well over the week and had lab work yesterday that was improved from previously. He reports overall he feels better than before and has been doing well at home until he had fever today. He notes he feels slightly tired and has had minimal diarrhea over the last 1-2 days. He notes no abdominal pain, nausea, vomiting, headaches, chest pain, shortness of breath, cough, upper respiratory symptoms, or urinary symptoms, or rash. He is able to answer all my questions easily with no difficulty. He's been staying well hydrated with Gatorade and water. I discussed with him that he may be dealing with the same illness he has had all week given that he still has some mild diarrhea though this could be something new. I discussed that given that he has no additional symptoms it would be reasonable to have him evaluated tomorrow at the walk-in clinic. I advised the patient if he were to develop any new symptoms overnight he should be evaluated in the emergency room. He can use Tylenol for fever. He was given reasons to go seek medical attention tonight. Additionally spoke with the patient's wife and she reported the same thing that he was telling me. Discussed all my recommendations with her as well. Patient will continue to hold off on taking the lisinopril as previously discussed.Marland Kitchen

## 2016-08-26 ENCOUNTER — Encounter: Payer: Self-pay | Admitting: Emergency Medicine

## 2016-08-26 ENCOUNTER — Emergency Department: Payer: Medicare Other

## 2016-08-26 ENCOUNTER — Emergency Department
Admission: EM | Admit: 2016-08-26 | Discharge: 2016-08-26 | Disposition: A | Discharge status: Home / Self Care | Payer: Medicare Other | Source: Home / Self Care | Attending: Emergency Medicine | Admitting: Emergency Medicine

## 2016-08-26 ENCOUNTER — Inpatient Hospital Stay
Admission: EM | Admit: 2016-08-26 | Discharge: 2016-09-04 | DRG: 372 | Disposition: A | Discharge status: Home Health | Payer: Medicare Other | Attending: Family Medicine | Admitting: Family Medicine

## 2016-08-26 DIAGNOSIS — A0472 Enterocolitis due to Clostridium difficile, not specified as recurrent: Secondary | ICD-10-CM | POA: Diagnosis present

## 2016-08-26 DIAGNOSIS — S0181XA Laceration without foreign body of other part of head, initial encounter: Secondary | ICD-10-CM | POA: Diagnosis not present

## 2016-08-26 DIAGNOSIS — D649 Anemia, unspecified: Secondary | ICD-10-CM | POA: Diagnosis not present

## 2016-08-26 DIAGNOSIS — R55 Syncope and collapse: Secondary | ICD-10-CM | POA: Diagnosis not present

## 2016-08-26 DIAGNOSIS — I951 Orthostatic hypotension: Secondary | ICD-10-CM

## 2016-08-26 DIAGNOSIS — A047 Enterocolitis due to Clostridium difficile: Principal | ICD-10-CM | POA: Diagnosis present

## 2016-08-26 DIAGNOSIS — G930 Cerebral cysts: Secondary | ICD-10-CM | POA: Diagnosis present

## 2016-08-26 DIAGNOSIS — A09 Infectious gastroenteritis and colitis, unspecified: Secondary | ICD-10-CM | POA: Diagnosis not present

## 2016-08-26 DIAGNOSIS — R9089 Other abnormal findings on diagnostic imaging of central nervous system: Secondary | ICD-10-CM

## 2016-08-26 DIAGNOSIS — Z8249 Family history of ischemic heart disease and other diseases of the circulatory system: Secondary | ICD-10-CM

## 2016-08-26 DIAGNOSIS — R197 Diarrhea, unspecified: Secondary | ICD-10-CM | POA: Insufficient documentation

## 2016-08-26 DIAGNOSIS — Z87891 Personal history of nicotine dependence: Secondary | ICD-10-CM

## 2016-08-26 DIAGNOSIS — R0600 Dyspnea, unspecified: Secondary | ICD-10-CM

## 2016-08-26 DIAGNOSIS — Z8619 Personal history of other infectious and parasitic diseases: Secondary | ICD-10-CM

## 2016-08-26 DIAGNOSIS — B370 Candidal stomatitis: Secondary | ICD-10-CM | POA: Diagnosis not present

## 2016-08-26 DIAGNOSIS — R008 Other abnormalities of heart beat: Secondary | ICD-10-CM | POA: Diagnosis not present

## 2016-08-26 DIAGNOSIS — S0101XA Laceration without foreign body of scalp, initial encounter: Secondary | ICD-10-CM

## 2016-08-26 DIAGNOSIS — Z806 Family history of leukemia: Secondary | ICD-10-CM

## 2016-08-26 DIAGNOSIS — Z85828 Personal history of other malignant neoplasm of skin: Secondary | ICD-10-CM

## 2016-08-26 DIAGNOSIS — E860 Dehydration: Secondary | ICD-10-CM | POA: Diagnosis present

## 2016-08-26 DIAGNOSIS — I493 Ventricular premature depolarization: Secondary | ICD-10-CM

## 2016-08-26 DIAGNOSIS — E871 Hypo-osmolality and hyponatremia: Secondary | ICD-10-CM | POA: Diagnosis not present

## 2016-08-26 DIAGNOSIS — S80212A Abrasion, left knee, initial encounter: Secondary | ICD-10-CM | POA: Diagnosis not present

## 2016-08-26 DIAGNOSIS — T17928A Food in respiratory tract, part unspecified causing other injury, initial encounter: Secondary | ICD-10-CM

## 2016-08-26 DIAGNOSIS — I10 Essential (primary) hypertension: Secondary | ICD-10-CM

## 2016-08-26 DIAGNOSIS — W19XXXA Unspecified fall, initial encounter: Secondary | ICD-10-CM | POA: Diagnosis present

## 2016-08-26 DIAGNOSIS — Z87442 Personal history of urinary calculi: Secondary | ICD-10-CM

## 2016-08-26 DIAGNOSIS — S80211A Abrasion, right knee, initial encounter: Secondary | ICD-10-CM | POA: Diagnosis not present

## 2016-08-26 DIAGNOSIS — R0602 Shortness of breath: Secondary | ICD-10-CM

## 2016-08-26 DIAGNOSIS — E876 Hypokalemia: Secondary | ICD-10-CM | POA: Diagnosis not present

## 2016-08-26 DIAGNOSIS — S0990XA Unspecified injury of head, initial encounter: Secondary | ICD-10-CM

## 2016-08-26 DIAGNOSIS — M6281 Muscle weakness (generalized): Secondary | ICD-10-CM

## 2016-08-26 DIAGNOSIS — Z79899 Other long term (current) drug therapy: Secondary | ICD-10-CM

## 2016-08-26 DIAGNOSIS — T17920A Food in respiratory tract, part unspecified causing asphyxiation, initial encounter: Secondary | ICD-10-CM

## 2016-08-26 DIAGNOSIS — Z823 Family history of stroke: Secondary | ICD-10-CM

## 2016-08-26 DIAGNOSIS — Y92012 Bathroom of single-family (private) house as the place of occurrence of the external cause: Secondary | ICD-10-CM

## 2016-08-26 DIAGNOSIS — R531 Weakness: Secondary | ICD-10-CM | POA: Diagnosis not present

## 2016-08-26 DIAGNOSIS — R6889 Other general symptoms and signs: Secondary | ICD-10-CM | POA: Diagnosis not present

## 2016-08-26 LAB — CBC WITH DIFFERENTIAL/PLATELET
BASOS PCT: 1 %
Basophils Absolute: 0.1 10*3/uL (ref 0–0.1)
EOS ABS: 0 10*3/uL (ref 0–0.7)
EOS PCT: 0 %
HCT: 33.9 % — ABNORMAL LOW (ref 40.0–52.0)
Hemoglobin: 12.2 g/dL — ABNORMAL LOW (ref 13.0–18.0)
LYMPHS ABS: 1 10*3/uL (ref 1.0–3.6)
Lymphocytes Relative: 10 %
MCH: 33.2 pg (ref 26.0–34.0)
MCHC: 36.1 g/dL — AB (ref 32.0–36.0)
MCV: 92.1 fL (ref 80.0–100.0)
MONO ABS: 1.7 10*3/uL — AB (ref 0.2–1.0)
Monocytes Relative: 17 %
NEUTROS ABS: 7.4 10*3/uL — AB (ref 1.4–6.5)
Neutrophils Relative %: 72 %
PLATELETS: 261 10*3/uL (ref 150–440)
RBC: 3.69 MIL/uL — ABNORMAL LOW (ref 4.40–5.90)
RDW: 14.2 % (ref 11.5–14.5)
WBC: 10.2 10*3/uL (ref 3.8–10.6)

## 2016-08-26 LAB — GASTROINTESTINAL PANEL BY PCR, STOOL (REPLACES STOOL CULTURE)
ADENOVIRUS F40/41: NOT DETECTED
ASTROVIRUS: NOT DETECTED
CAMPYLOBACTER SPECIES: NOT DETECTED
Cryptosporidium: NOT DETECTED
Cyclospora cayetanensis: NOT DETECTED
E. coli O157: NOT DETECTED
ENTEROPATHOGENIC E COLI (EPEC): NOT DETECTED
ENTEROTOXIGENIC E COLI (ETEC): NOT DETECTED
Entamoeba histolytica: NOT DETECTED
Enteroaggregative E coli (EAEC): NOT DETECTED
Giardia lamblia: NOT DETECTED
NOROVIRUS GI/GII: NOT DETECTED
PLESIMONAS SHIGELLOIDES: NOT DETECTED
ROTAVIRUS A: NOT DETECTED
SHIGA LIKE TOXIN PRODUCING E COLI (STEC): NOT DETECTED
Salmonella species: NOT DETECTED
Sapovirus (I, II, IV, and V): NOT DETECTED
Shigella/Enteroinvasive E coli (EIEC): NOT DETECTED
VIBRIO SPECIES: NOT DETECTED
Vibrio cholerae: NOT DETECTED
Yersinia enterocolitica: NOT DETECTED

## 2016-08-26 LAB — COMPREHENSIVE METABOLIC PANEL
ALT: 24 U/L (ref 17–63)
ALT: 24 U/L (ref 17–63)
ANION GAP: 6 (ref 5–15)
ANION GAP: 8 (ref 5–15)
AST: 27 U/L (ref 15–41)
AST: 29 U/L (ref 15–41)
Albumin: 3.3 g/dL — ABNORMAL LOW (ref 3.5–5.0)
Albumin: 3 g/dL — ABNORMAL LOW (ref 3.5–5.0)
Alkaline Phosphatase: 48 U/L (ref 38–126)
Alkaline Phosphatase: 42 U/L (ref 38–126)
BUN: 12 mg/dL (ref 6–20)
BUN: 13 mg/dL (ref 6–20)
CALCIUM: 8.2 mg/dL — AB (ref 8.9–10.3)
CALCIUM: 7.9 mg/dL — AB (ref 8.9–10.3)
CHLORIDE: 97 mmol/L — AB (ref 101–111)
CO2: 27 mmol/L (ref 22–32)
CO2: 25 mmol/L (ref 22–32)
Chloride: 98 mmol/L — ABNORMAL LOW (ref 101–111)
Creatinine, Ser: 1.19 mg/dL (ref 0.61–1.24)
Creatinine, Ser: 1.08 mg/dL (ref 0.61–1.24)
GFR calc non Af Amer: 58 mL/min — ABNORMAL LOW (ref >60)
Glucose, Bld: 121 mg/dL — ABNORMAL HIGH (ref 65–99)
Glucose, Bld: 126 mg/dL — ABNORMAL HIGH (ref 65–99)
Potassium: 4.3 mmol/L (ref 3.5–5.1)
Potassium: 3.9 mmol/L (ref 3.5–5.1)
SODIUM: 130 mmol/L — AB (ref 135–145)
Sodium: 131 mmol/L — ABNORMAL LOW (ref 135–145)
Total Bilirubin: 1.2 mg/dL (ref 0.3–1.2)
Total Bilirubin: 1.1 mg/dL (ref 0.3–1.2)
Total Protein: 8 g/dL (ref 6.5–8.1)
Total Protein: 7.5 g/dL (ref 6.5–8.1)

## 2016-08-26 LAB — CBC
HCT: 36.3 % — ABNORMAL LOW (ref 40.0–52.0)
HEMOGLOBIN: 12.6 g/dL — AB (ref 13.0–18.0)
MCH: 32.4 pg (ref 26.0–34.0)
MCHC: 34.8 g/dL (ref 32.0–36.0)
MCV: 93 fL (ref 80.0–100.0)
Platelets: 282 10*3/uL (ref 150–440)
RBC: 3.9 MIL/uL — AB (ref 4.40–5.90)
RDW: 14 % (ref 11.5–14.5)
WBC: 8.9 10*3/uL (ref 3.8–10.6)

## 2016-08-26 LAB — URINALYSIS COMPLETE WITH MICROSCOPIC (ARMC ONLY)
Bilirubin Urine: NEGATIVE
Glucose, UA: NEGATIVE mg/dL
KETONES UR: NEGATIVE mg/dL
LEUKOCYTES UA: NEGATIVE
NITRITE: NEGATIVE
PH: 5 (ref 5.0–8.0)
PROTEIN: NEGATIVE mg/dL
RBC / HPF: NONE SEEN RBC/hpf (ref 0–5)
SPECIFIC GRAVITY, URINE: 1.016 (ref 1.005–1.030)
Squamous Epithelial / LPF: NONE SEEN

## 2016-08-26 LAB — C DIFFICILE QUICK SCREEN W PCR REFLEX
C DIFFICILE (CDIFF) TOXIN: POSITIVE — AB
C DIFFICLE (CDIFF) ANTIGEN: POSITIVE — AB
C Diff interpretation: DETECTED

## 2016-08-26 LAB — LIPASE, BLOOD: LIPASE: 53 U/L — AB (ref 11–51)

## 2016-08-26 LAB — TROPONIN I

## 2016-08-26 LAB — BRAIN NATRIURETIC PEPTIDE: B Natriuretic Peptide: 205 pg/mL — ABNORMAL HIGH (ref 0.0–100.0)

## 2016-08-26 MED ORDER — MAGNESIUM CITRATE PO SOLN
1.0000 | Freq: Once | ORAL | Status: DC | PRN
  Filled 2016-08-26: qty 296

## 2016-08-26 MED ORDER — ACETAMINOPHEN 325 MG PO TABS
650.0000 mg | ORAL_TABLET | Freq: Four times a day (QID) | ORAL | Status: DC | PRN
  Administered 2016-08-28 – 2016-09-01 (×2): 650 mg via ORAL
  Filled 2016-08-26 (×3): qty 2

## 2016-08-26 MED ORDER — ZOLPIDEM TARTRATE 5 MG PO TABS
5.0000 mg | ORAL_TABLET | Freq: Every evening | ORAL | Status: DC | PRN
  Administered 2016-09-03: 22:00:00 5 mg via ORAL
  Filled 2016-08-26: qty 1

## 2016-08-26 MED ORDER — ENOXAPARIN SODIUM 40 MG/0.4ML ~~LOC~~ SOLN
40.0000 mg | SUBCUTANEOUS | Status: DC
  Administered 2016-08-27 – 2016-09-04 (×9): 40 mg via SUBCUTANEOUS
  Filled 2016-08-26 (×9): qty 0.4

## 2016-08-26 MED ORDER — SODIUM CHLORIDE 0.9% FLUSH
3.0000 mL | Freq: Two times a day (BID) | INTRAVENOUS | Status: DC
  Administered 2016-08-27 – 2016-09-03 (×7): 3 mL via INTRAVENOUS

## 2016-08-26 MED ORDER — LIDOCAINE-EPINEPHRINE (PF) 1 %-1:200000 IJ SOLN
30.0000 mL | Freq: Once | INTRAMUSCULAR | Status: AC
  Administered 2016-08-26: 30 mL
  Filled 2016-08-26: qty 30

## 2016-08-26 MED ORDER — SENNOSIDES-DOCUSATE SODIUM 8.6-50 MG PO TABS: 1.0000 | ORAL_TABLET | Freq: Every evening | ORAL | Status: DC | PRN

## 2016-08-26 MED ORDER — ONDANSETRON HCL 4 MG/2ML IJ SOLN: 4.0000 mg | Freq: Four times a day (QID) | INTRAMUSCULAR | Status: DC | PRN

## 2016-08-26 MED ORDER — SODIUM CHLORIDE 0.9 % IV SOLN
INTRAVENOUS | Status: DC
  Administered 2016-08-27 – 2016-09-04 (×10): via INTRAVENOUS

## 2016-08-26 MED ORDER — ONDANSETRON HCL 4 MG PO TABS: 4.0000 mg | ORAL_TABLET | Freq: Four times a day (QID) | ORAL | Status: DC | PRN

## 2016-08-26 MED ORDER — ONDANSETRON 4 MG PO TBDP: 4.0000 mg | ORAL_TABLET | Freq: Three times a day (TID) | ORAL | 0 refills | Status: DC | PRN

## 2016-08-26 MED ORDER — CIPROFLOXACIN HCL 500 MG PO TABS: 500.0000 mg | ORAL_TABLET | Freq: Two times a day (BID) | ORAL | 0 refills | Status: DC

## 2016-08-26 MED ORDER — SODIUM CHLORIDE 0.9 % IV SOLN
Freq: Once | INTRAVENOUS | Status: AC
  Administered 2016-08-26: 19:00:00 via INTRAVENOUS

## 2016-08-26 MED ORDER — ACETAMINOPHEN 650 MG RE SUPP: 650.0000 mg | Freq: Four times a day (QID) | RECTAL | Status: DC | PRN

## 2016-08-26 MED ORDER — BISACODYL 5 MG PO TBEC: 5.0000 mg | DELAYED_RELEASE_TABLET | Freq: Every day | ORAL | Status: DC | PRN

## 2016-08-26 MED ORDER — HYDROCODONE-ACETAMINOPHEN 5-325 MG PO TABS: 1.0000 | ORAL_TABLET | ORAL | Status: DC | PRN

## 2016-08-26 MED ORDER — LISINOPRIL 10 MG PO TABS: 5.0000 mg | ORAL_TABLET | Freq: Every day | ORAL | Status: DC

## 2016-08-26 NOTE — ED Notes (Signed)
I found pt up and using the toilet in the room without assistance. Pt was then assisted back to bed and all monitor cords were reapplied to pt, pt verbalized that nothing was needed at this time

## 2016-08-26 NOTE — ED Notes (Signed)
Patient transported to X-ray 

## 2016-08-26 NOTE — ED Triage Notes (Signed)
Pt to ED from home via [facility] c/o fall. Per EMS pt had a BM and got light headed, and fell face forward. Pt presents with laceration to forehead, skin tear to right elbow, and abrasions on both knees. Pt alert and oriented, in no acute distress.

## 2016-08-26 NOTE — ED Notes (Signed)
Pt was given a warm blanket per comfort/ nothing else needed of staff at this time

## 2016-08-26 NOTE — ED Notes (Signed)
A stool and urine sample has been collected and sent to the lab RN has been notified

## 2016-08-26 NOTE — ED Triage Notes (Addendum)
Patient presents to Emergency Department via EMS with complaints of diarhea for 1 week.  SOB tonight.  No hx of cc. NAD  Pt reports new SOB after waking to toilet (diarrhea for last week) after returning to bed pt had to "mouth breathe" in order to ventilate to comfort, pt reports dry nagging cough for last few years.  Pt reports fever of 102; 2 nights ago but wife gave 2 x "baby asprin" and no fever or meds since, pt reports hx of HTN (no longer taking lisinopril for such), stool has been brown to clear, (pt has no sense of smell), watery to partially formed over the last 7 days.  Recently treated for hyponatremia and dehydration.

## 2016-08-26 NOTE — ED Provider Notes (Signed)
[facility] Emergency Department Provider Note        Time seen: ----------------------------------------- 6:33 PM on 08/26/2016 -----------------------------------------    I have reviewed the triage vital signs and the nursing notes.   HISTORY  Chief Complaint Fall    HPI Henry Mays is a 75 y.o. male who presents the ER being brought by EMS after a fall. EMS states he had a bowel movement and got lightheaded. Patient was seen here earlier in the day for diarrhea and states the diarrhea has persisted. Patient states he fell face forward sustaining a laceration to the right side of his forehead and a skin tear to his right elbow. He also had abrasions noted to both knees. He arrives in no acute distress, states he did not lose consciousness.   Past Medical History:  Diagnosis Date  . Arthritis   . Basal cell carcinoma   . Chickenpox   . Kidney stones     Patient Active Problem List   Diagnosis Date Noted  . Viral gastroenteritis 08/20/2016  . Intractable hiccups 08/07/2016  . Cellulitis 08/07/2016  . 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 Osteoporosis 05/22/2016  . Decreased right shoulder range of motion 04/10/2016  . Essential hypertension 04/10/2016  . MGUS (monoclonal gammopathy of unknown significance) 04/10/2016    Past Surgical History:  Procedure Laterality Date  . BASAL CELL CARCINOMA EXCISION    . HERNIA REPAIR      Allergies Review of patient's allergies indicates no known allergies.  Social History Social History  Substance Use Topics  . Smoking status: Former Research scientist (life sciences)  . Smokeless tobacco: Never Used  . Alcohol use 6.0 oz/week    10 Standard drinks or equivalent per week    Review of Systems Constitutional: Negative for fever. Cardiovascular: Negative for chest pain. Respiratory: Negative for shortness of breath. Gastrointestinal: Negative for abdominal pain, Positive for diarrhea Genitourinary: Negative for  dysuria. Musculoskeletal: Negative for back pain. Skin: Positive for right frontal scalp laceration, knee abrasions, right elbow skin tear Neurological: Positive for mild headache  10-point ROS otherwise negative.  ____________________________________________   PHYSICAL EXAM:  VITAL SIGNS: ED Triage Vitals  Enc Vitals Group     BP 08/26/16 1817 134/64     Pulse Rate 08/26/16 1817 (!) 103     Resp 08/26/16 1817 20     Temp 08/26/16 1817 98.8 F (37.1 C)     Temp Source 08/26/16 1817 Oral     SpO2 08/26/16 1817 98 %     Weight 08/26/16 1818 150 lb (68 kg)     Height 08/26/16 1818 5\' 8"  (1.727 m)     Head Circumference --      Peak Flow --      Pain Score --      Pain Loc --      Pain Edu? --      Excl. in [place]? --     Constitutional: Alert and oriented. Well appearing and in no distress. Eyes: Conjunctivae are normal. PERRL. Normal extraocular movements. ENT   Head: Stellate frontal scalp laceration above the right eyebrow   Nose: No congestion/rhinnorhea.   Mouth/Throat: Mucous membranes are moist.   Neck: No stridor. Cardiovascular: Normal rate, regular rhythm. No murmurs, rubs, or gallops. Respiratory: Normal respiratory effort without tachypnea nor retractions. Breath sounds are clear and equal bilaterally. No wheezes/rales/rhonchi. Gastrointestinal: Soft and nontender. Normal bowel sounds Musculoskeletal: Nontender with normal range of motion in all extremities. No lower extremity tenderness nor  edema. Neurologic:  Normal speech and language. No gross focal neurologic deficits are appreciated.  Skin:  Abrasions over both knees, skin tear over the right elbow laterally, right frontal scalp laceration as dictated above. Psychiatric: Mood and affect are normal. Speech and behavior are normal.  ____________________________________________  EKG: Interpreted by me.Normal sinus rhythm with a rate of 99 bpm, normal PR interval, normal QRS, normal QT intervals.  Normal axis. No evidence of acute infarction.  ____________________________________________  ED COURSE:  Pertinent labs & imaging results that were available during my care of the patient were reviewed by me and considered in my medical decision making (see chart for details). Clinical Course  Patient presents to ER after a near syncopal event. Possibly secondary to diarrhea, he received IV fluids, will check basic labs and he will require laceration repair.  Marland Kitchen.Laceration Repair Date/Time: 08/26/2016 7:16 PM Performed by: Earleen Newport Authorized by: Lenise Arena E   Consent:    Consent obtained:  Verbal Anesthesia (see MAR for exact dosages):    Anesthesia method:  Local infiltration   Local anesthetic:  Lidocaine 1% WITH epi Laceration details:    Location:  Face   Length (cm):  4   Depth (mm):  0.5 Repair type:    Repair type:  Intermediate Pre-procedure details:    Preparation:  Patient was prepped and draped in usual sterile fashion Exploration:    Hemostasis achieved with:  Epinephrine Treatment:    Area cleansed with:  Betadine and saline   Amount of cleaning:  Standard   Irrigation solution:  Sterile saline   Visualized foreign bodies/material removed: no   Skin repair:    Repair method:  Sutures   Suture size:  5-0   Suture material:  Prolene   Suture technique:  Running Approximation:    Approximation:  Close   Vermilion border: well-aligned   Post-procedure details:    Dressing:  Antibiotic ointment and non-adherent dressing   Patient tolerance of procedure:  Tolerated well, no immediate complications   ____________________________________________   LABS (pertinent positives/negatives)  Labs Reviewed  CBC WITH DIFFERENTIAL/PLATELET - Abnormal; Notable for the following:       Result Value   RBC 3.69 (*)    Hemoglobin 12.2 (*)    HCT 33.9 (*)    MCHC 36.1 (*)    Neutro Abs 7.4 (*)    Monocytes Absolute 1.7 (*)    All other components  within normal limits  COMPREHENSIVE METABOLIC PANEL - Abnormal; Notable for the following:    Sodium 130 (*)    Chloride 97 (*)    Glucose, Bld 126 (*)    Calcium 7.9 (*)    Albumin 3.0 (*)    All other components within normal limits  GASTROINTESTINAL PANEL BY PCR, STOOL (REPLACES STOOL CULTURE)  C DIFFICILE QUICK SCREEN W PCR REFLEX  TROPONIN I    RADIOLOGY Images were viewed by me  CT head IMPRESSION: 1. Indeterminate slightly hyperdense 1.4 cm mass at the left foramen of Monro. Differential includes a neoplasm such as a subependymoma or colloid cyst. This is not considered to represent hemorrhage. Recommend further evaluation with brain MRI without and with IV contrast when clinically feasible. 2. No definite ventriculomegaly accounting for generalized cerebral volume loss. No midline shift. No acute intracranial hemorrhage. No extra-axial fluid collections. 3. Small right frontal scalp contusion. 4. Mild left paranasal sinusitis, possibly acute. ____________________________________________  FINAL ASSESSMENT AND PLAN  Dizziness, head injury, diarrhea  Plan: Patient with labs and  imaging as dictated above. She has had a week of symptoms including gastroenteritis and dehydration. He was just seen in the ER today and went home had more diarrhea and near syncope. He has received IV fluids, he has received a wound repair with good cosmetic result. Questionable findings on CT. I will discuss with the hospitalist for admission.   Earleen Newport, MD   Note: This dictation was prepared with Dragon dictation. Any transcriptional errors that result from this process are unintentional    Earleen Newport, MD 08/26/16 2055

## 2016-08-26 NOTE — ED Provider Notes (Signed)
[facility] Emergency Department Provider Note  ____________________________________________  Time seen: Approximately 7:54 AM  I have reviewed the triage vital signs and the nursing notes.   HISTORY  Chief Complaint Shortness of Breath and Diarrhea    HPI Henry Mays is a 75 y.o. male complains of diarrhea for the past week. Is also been feeling very fatigued and having generalized weakness. No abdominal pain chest pain shortness of breath. Had a fever 102 last night and took aspirin for it. Had felt like his diarrhea had been tapering off from a peak of about an episode every one to 2 hours, but now seems to be worsening again. No black or bloody stools. Just watery. No recent travel or hospitalizations. No recent antibiotic use. No dizziness or syncope. Not had anything like this before, does not have IBD or malabsorption syndrome.  Patient is tolerating oral intake although he has decreased appetite. Drinking Powerade to maintain hydration.   Past Medical History:  Diagnosis Date  . Arthritis   . Basal cell carcinoma   . Chickenpox   . Kidney stones      Patient Active Problem List   Diagnosis Date Noted  . Viral gastroenteritis 08/20/2016  . Intractable hiccups 08/07/2016  . Cellulitis 08/07/2016  . Colloid cyst of brain ([place]) 05/22/2016  . Osteoporosis 05/22/2016  . Decreased right shoulder range of motion 04/10/2016  . Essential hypertension 04/10/2016  . MGUS (monoclonal gammopathy of unknown significance) 04/10/2016     Past Surgical History:  Procedure Laterality Date  . BASAL CELL CARCINOMA EXCISION    . HERNIA REPAIR       Prior to Admission medications   Medication Sig Start Date End Date Taking? Authorizing Provider  chlorproMAZINE (THORAZINE) 25 MG tablet Take 1 tablet (25 mg total) by mouth 3 (three) times daily as needed. 08/07/16   Leone Haven, MD  ciprofloxacin (CIPRO) 500 MG tablet Take 1 tablet (500 mg total) by  mouth 2 (two) times daily. 08/26/16   Carrie Mew, MD  dexamethasone 0.5 MG/5ML elixir  03/27/16   Historical Provider, MD  lisinopril (PRINIVIL,ZESTRIL) 5 MG tablet TAKE 1 TABLET BY MOUTH DAILY 06/25/16   Leone Haven, MD  ondansetron (ZOFRAN ODT) 4 MG disintegrating tablet Take 1 tablet (4 mg total) by mouth every 8 (eight) hours as needed for nausea or vomiting. 08/26/16   Carrie Mew, MD     Allergies Review of patient's allergies indicates no known allergies.   Family History  Problem Relation Age of Onset  . Arthritis    . Stroke    . Hypertension    . Leukemia Father     Social History Social History  Substance Use Topics  . Smoking status: Former Research scientist (life sciences)  . Smokeless tobacco: Never Used  . Alcohol use 6.0 oz/week    10 Standard drinks or equivalent per week    Review of Systems  Constitutional:   Positive fever yesterday  ENT:   No sore throat. No rhinorrhea. No recent viral illness Cardiovascular:   No chest pain. Respiratory:   No dyspnea or cough. Gastrointestinal:   Negative for abdominal pain, positive diarrhea as above.  Genitourinary:   Negative for dysuria or difficulty urinating.  10-point ROS otherwise negative.  ____________________________________________   PHYSICAL EXAM:  VITAL SIGNS: ED Triage Vitals  Enc Vitals Group     BP 08/26/16 0622 133/66     Pulse Rate 08/26/16 0622 92     Resp 08/26/16 0622 18  Temp 08/26/16 0622 98.1 F (36.7 C)     Temp Source 08/26/16 0622 Oral     SpO2 08/26/16 0622 98 %     Weight 08/26/16 0626 150 lb (68 kg)     Height 08/26/16 0626 5\' 8"  (1.727 m)     Head Circumference --      Peak Flow --      Pain Score 08/26/16 0652 0     Pain Loc --      Pain Edu? --      Excl. in [place]? --     Vital signs reviewed, nursing assessments reviewed.   Constitutional:   Alert and oriented. Well appearing and in no distress. Eyes:   No scleral icterus. No conjunctival pallor. PERRL. EOMI.  No  nystagmus. ENT   Head:   Normocephalic and atraumatic.   Nose:   No congestion/rhinnorhea. No septal hematoma   Mouth/Throat:   MMM, no pharyngeal erythema. No peritonsillar mass.    Neck:   No stridor. No SubQ emphysema. No meningismus. Hematological/Lymphatic/Immunilogical:   No cervical lymphadenopathy. Cardiovascular:   RRR. Symmetric bilateral radial and DP pulses.  No murmurs.  Respiratory:   Normal respiratory effort without tachypnea nor retractions. Breath sounds are clear and equal bilaterally. No wheezes/rales/rhonchi. Gastrointestinal:   Soft and nontender. Mild distention. Hyperactive bowel sounds.. There is no CVA tenderness.  No rebound, rigidity, or guarding. Genitourinary:   deferred Musculoskeletal:   Nontender with normal range of motion in all extremities. No joint effusions.  No lower extremity tenderness.  No edema. Neurologic:   Normal speech and language.  CN 2-10 normal. Motor grossly intact. No gross focal neurologic deficits are appreciated.  Skin:    Skin is warm, dry and intact. No rash noted.  No petechiae, purpura, or bullae.  ____________________________________________    LABS (pertinent positives/negatives) (all labs ordered are listed, but only abnormal results are displayed) Labs Reviewed  LIPASE, BLOOD - Abnormal; Notable for the following:       Result Value   Lipase 53 (*)    All other components within normal limits  COMPREHENSIVE METABOLIC PANEL - Abnormal; Notable for the following:    Sodium 131 (*)    Chloride 98 (*)    Glucose, Bld 121 (*)    Calcium 8.2 (*)    Albumin 3.3 (*)    GFR calc non Af Amer 58 (*)    All other components within normal limits  CBC - Abnormal; Notable for the following:    RBC 3.90 (*)    Hemoglobin 12.6 (*)    HCT 36.3 (*)    All other components within normal limits  BRAIN NATRIURETIC PEPTIDE - Abnormal; Notable for the following:    B Natriuretic Peptide 205.0 (*)    All other components  within normal limits  URINALYSIS COMPLETEWITH MICROSCOPIC ([facility] ONLY)   ____________________________________________   EKG    ____________________________________________    RADIOLOGY  Chest x-ray unremarkable  ____________________________________________   PROCEDURES Procedures  ____________________________________________   INITIAL IMPRESSION / ASSESSMENT AND PLAN / ED COURSE  Pertinent labs & imaging results that were available during my care of the patient were reviewed by me and considered in my medical decision making (see chart for details).  Patient well appearing no acute distress.Considering the patient's symptoms, medical history, and physical examination today, I have low suspicion for cholecystitis or biliary pathology, pancreatitis, perforation or bowel obstruction, hernia, intra-abdominal abscess, AAA or dissection, volvulus or intussusception, mesenteric ischemia, or appendicitis.  At this point appears the patient has a likely infectious diarrhea, and with lack of improvement and fever last night that he warrants a 3 day course of ciprofloxacin to help alleviate symptoms. Not having any bloody stool to suggest that he has enterohemorrhagic Escherichia coli that would be at risk for hemolytic uremic syndrome. I'll prescribe Cipro and Zofran. Encouraged to continue drinking lots of fluids. Counseled on food choices as well. Follow-up with primary care in about 3 days once he's completed the Cipro course.     Clinical Course   ____________________________________________   FINAL CLINICAL IMPRESSION(S) / ED DIAGNOSES  Final diagnoses:  Diarrhea of presumed infectious origin       Portions of this note were generated with dragon dictation software. Dictation errors may occur despite best attempts at proofreading.    Carrie Mew, MD 08/26/16 [phone]

## 2016-08-27 ENCOUNTER — Observation Stay: Payer: Medicare Other

## 2016-08-27 DIAGNOSIS — R930 Abnormal findings on diagnostic imaging of skull and head, not elsewhere classified: Secondary | ICD-10-CM | POA: Diagnosis not present

## 2016-08-27 DIAGNOSIS — E871 Hypo-osmolality and hyponatremia: Secondary | ICD-10-CM | POA: Diagnosis not present

## 2016-08-27 DIAGNOSIS — R9089 Other abnormal findings on diagnostic imaging of central nervous system: Secondary | ICD-10-CM | POA: Diagnosis not present

## 2016-08-27 DIAGNOSIS — A047 Enterocolitis due to Clostridium difficile: Secondary | ICD-10-CM | POA: Diagnosis not present

## 2016-08-27 LAB — BASIC METABOLIC PANEL
ANION GAP: 7 (ref 5–15)
BUN: 14 mg/dL (ref 6–20)
CALCIUM: 7.6 mg/dL — AB (ref 8.9–10.3)
CO2: 23 mmol/L (ref 22–32)
Chloride: 100 mmol/L — ABNORMAL LOW (ref 101–111)
Creatinine, Ser: 1.11 mg/dL (ref 0.61–1.24)
Glucose, Bld: 137 mg/dL — ABNORMAL HIGH (ref 65–99)
POTASSIUM: 3.8 mmol/L (ref 3.5–5.1)
Sodium: 130 mmol/L — ABNORMAL LOW (ref 135–145)

## 2016-08-27 LAB — CBC
HCT: 33.2 % — ABNORMAL LOW (ref 40.0–52.0)
HEMOGLOBIN: 11.8 g/dL — AB (ref 13.0–18.0)
MCH: 33.1 pg (ref 26.0–34.0)
MCHC: 35.6 g/dL (ref 32.0–36.0)
MCV: 93 fL (ref 80.0–100.0)
Platelets: 257 10*3/uL (ref 150–440)
RBC: 3.57 MIL/uL — AB (ref 4.40–5.90)
RDW: 13.6 % (ref 11.5–14.5)
WBC: 10.3 10*3/uL (ref 3.8–10.6)

## 2016-08-27 LAB — MAGNESIUM: MAGNESIUM: 1.7 mg/dL (ref 1.7–2.4)

## 2016-08-27 LAB — PHOSPHORUS: PHOSPHORUS: 2.8 mg/dL (ref 2.5–4.6)

## 2016-08-27 LAB — TSH: TSH: 0.84 u[IU]/mL (ref 0.350–4.500)

## 2016-08-27 MED ORDER — GADOBENATE DIMEGLUMINE 529 MG/ML IV SOLN
15.0000 mL | Freq: Once | INTRAVENOUS | Status: AC | PRN
Start: 2016-08-27 — End: 2016-08-27
  Administered 2016-08-27: 12:00:00 14 mL via INTRAVENOUS

## 2016-08-27 MED ORDER — METRONIDAZOLE 500 MG PO TABS
500.0000 mg | ORAL_TABLET | Freq: Three times a day (TID) | ORAL | Status: DC
  Administered 2016-08-27: 500 mg via ORAL
  Filled 2016-08-27: qty 1

## 2016-08-27 MED ORDER — ORAL CARE MOUTH RINSE
15.0000 mL | Freq: Two times a day (BID) | OROMUCOSAL | Status: DC
  Administered 2016-08-27 – 2016-09-03 (×13): 15 mL via OROMUCOSAL

## 2016-08-27 MED ORDER — METRONIDAZOLE 500 MG PO TABS
500.0000 mg | ORAL_TABLET | Freq: Three times a day (TID) | ORAL | Status: DC
  Administered 2016-08-27 – 2016-08-28 (×4): 500 mg via ORAL
  Filled 2016-08-27 (×5): qty 1

## 2016-08-27 MED ORDER — VANCOMYCIN 50 MG/ML ORAL SOLUTION
125.0000 mg | Freq: Four times a day (QID) | ORAL | Status: DC
  Administered 2016-08-27 (×2): 125 mg via ORAL
  Filled 2016-08-27 (×3): qty 2.5

## 2016-08-27 NOTE — Progress Notes (Signed)
[place] [facility] [place] NAME: Henry Mays    MR#:  [id number]  DATE OF BIRTH:  1941-05-04  SUBJECTIVE:   Patient still with diarrhea. No abdominal pain, fever or chills. She patient has not had recent antibiotics prior to diarrhea or been in the hospital. No contact with patients with C. difficile.  REVIEW OF SYSTEMS:    Review of Systems  Constitutional: Positive for malaise/fatigue. Negative for chills and fever.  HENT: Negative.  Negative for ear discharge, ear pain, hearing loss, nosebleeds and sore throat.   Eyes: Negative.  Negative for blurred vision and pain.  Respiratory: Negative.  Negative for cough, hemoptysis, shortness of breath and wheezing.   Cardiovascular: Negative.  Negative for chest pain, palpitations and leg swelling.  Gastrointestinal: Positive for diarrhea. Negative for abdominal pain, blood in stool, nausea and vomiting.  Genitourinary: Negative.  Negative for dysuria.  Musculoskeletal: Negative.  Negative for back pain.  Skin: Negative.   Neurological: Positive for weakness. Negative for dizziness, tremors, speech change, focal weakness, seizures and headaches.  Endo/Heme/Allergies: Negative.  Does not bruise/bleed easily.  Psychiatric/Behavioral: Negative.  Negative for depression, hallucinations and suicidal ideas.    Tolerating Diet:Clear liquid      DRUG ALLERGIES:  No Known Allergies  VITALS:  Blood pressure (!) 129/55, pulse (!) 106, temperature 98.5 F (36.9 C), temperature source Oral, resp. rate (!) 24, height 5\' 8"  (1.727 m), weight 68 kg (150 lb), SpO2 99 %.  PHYSICAL EXAMINATION:   Physical Exam    LABORATORY PANEL:   CBC  Recent Labs Lab 08/27/16 0426  WBC 10.3  HGB 11.8*  HCT 33.2*  PLT 257   ------------------------------------------------------------------------------------------------------------------  Chemistries   Recent Labs Lab 08/26/16 1922 08/27/16 0426  NA  130* 130*  K 3.9 3.8  CL 97* 100*  CO2 25 23  GLUCOSE 126* 137*  BUN 13 14  CREATININE 1.08 1.11  CALCIUM 7.9* 7.6*  MG 1.7  --   AST 29  --   ALT 24  --   ALKPHOS 42  --   BILITOT 1.1  --    ------------------------------------------------------------------------------------------------------------------  Cardiac Enzymes  Recent Labs Lab 08/26/16 1922  TROPONINI <0.03   ------------------------------------------------------------------------------------------------------------------  RADIOLOGY:  Dg Chest 2 View  Result Date: 08/26/2016 CLINICAL DATA:  Shortness of breath, onset last night. EXAM: CHEST  2 VIEW COMPARISON:  None. FINDINGS: The lungs are hyperinflated. Left basilar atelectasis or scarring. Heart size is normal. There is atherosclerosis of the thoracic aorta. Ill-defined right infrahilar opacity. No pleural fluid or pneumothorax. Chronic change about the right shoulder. IMPRESSION: Ill-defined right infrahilar opacity, favor atelectasis, less likely pneumonia. Linear atelectasis or scarring at the left lung base. Mild hyperinflation. Thoracic aortic atherosclerosis. Electronically Signed   By: Henry Mays M.D.   On: 08/26/2016 07:02   Ct Head Wo Contrast  Result Date: 08/26/2016 CLINICAL DATA:  Fall.  Head injury.  Right frontal laceration. EXAM: CT HEAD WITHOUT CONTRAST TECHNIQUE: Contiguous axial images were obtained from the base of the skull through the vertex without intravenous contrast. COMPARISON:  None. FINDINGS: Brain: There is a slightly hyperdense 1.4 cm mass at the left foramen of Monro. No definite ventriculomegaly accounting for generalized cerebral volume loss. Otherwise no evidence of parenchymal hemorrhage or extra-axial fluid collection. Otherwise no mass lesion, mass effect, or midline shift. No CT evidence of acute infarction. Intracranial atherosclerosis. Vascular: No hyperdense vessel or unexpected calcification. Skull: No evidence of  calvarial fracture. Sinuses/Orbits:  There are mild layering frothy secretions in the left maxillary and left sphenoid sinus with associated minimal mucoperiosteal thickening. Other: Small right frontal scalp contusion. The mastoid air cells are unopacified. IMPRESSION: 1. Indeterminate slightly hyperdense 1.4 cm mass at the left foramen of Monro. Differential includes a neoplasm such as a subependymoma or colloid cyst. This is not considered to represent hemorrhage. Recommend further evaluation with brain MRI without and with IV contrast when clinically feasible. 2. No definite ventriculomegaly accounting for generalized cerebral volume loss. No midline shift. No acute intracranial hemorrhage. No extra-axial fluid collections. 3. Small right frontal scalp contusion. 4. Mild left paranasal sinusitis, possibly acute. Electronically Signed   By: Henry Mays M.D.   On: 08/26/2016 19:13     ASSESSMENT AND PLAN:   75 year old male who presents to diarrhea and found have C. difficile diarrhea.   1. C. difficile diarrhea: This is patient's first bout of diarrhea. Continue only Flagyl.  2. Generalized weakness due to diarrhea: PT consult for disposition.  3. Hyponatremia: This is due to dehydration and diarrhea. IV fluids and repeat BMP in a.m. Management plans discussed with the patient and he is in agreement.  CODE STATUS: full  TOTAL TIME TAKING CARE OF THIS PATIENT: 30 minutes.    Rounded with nursing staff today POSSIBLE D/C tomorrow, DEPENDING ON CLINICAL CONDITION.   Henry Mays M.D on 08/27/2016 at 11:12 AM  Between 7am to 6pm - Pager - [phone] After 6pm go to [email or web address] - password [email or web address] [facility]  Office  [phone]  CC: Primary care physician; Henry Rumps, MD  Note: This dictation was prepared with Dragon dictation along with smaller phrase technology. Any transcriptional errors that result from this process are unintentional.

## 2016-08-27 NOTE — Progress Notes (Signed)
Patient ID: Henry Mays, male   DOB: [birth year], 75 y.o.   MRN: [id number]   Results reviewed and include positive C. difficile. We'll start by mouth Vanco and Flagyl.

## 2016-08-27 NOTE — Progress Notes (Signed)
Per Dr Benjie Karvonen ok to advance diet to soft.

## 2016-08-27 NOTE — Progress Notes (Signed)
Pharmacy Antibiotic Note  Henry Mays is a 75 y.o. male admitted on 08/26/2016 with C.diff.  Pharmacy has been consulted for Vancomycin oral dosing.  Plan: Vancomycin 1250mg  PO q6h  Height: 5\' 8"  (172.7 cm) Weight: 150 lb (68 kg) IBW/kg (Calculated) : 68.4  Temp (24hrs), Avg:98.5 F (36.9 C), Min:98.1 F (36.7 C), Max:98.8 F (37.1 C)   Recent Labs Lab 08/21/16 1125 08/22/16 1352 08/24/16 1352 08/26/16 0632 08/26/16 1922  WBC  --   --   --  8.9 10.2  CREATININE 1.44 1.41 0.98 1.19 1.08    Estimated Creatinine Clearance: 57.7 mL/min (by C-G formula based on SCr of 1.08 mg/dL).    No Known Allergies  Antimicrobials this admission: Vancomycin 9/18 >>  Metronidazole 9/18 >>   Dose adjustments this admission:  Microbiology results: 9/17 C.diff antigen+ and toxin+  Thank you for allowing pharmacy to be a part of this patient's care.  Paulina Fusi, PharmD, BCPS 08/27/2016 1:18 AM

## 2016-08-27 NOTE — Care Management Obs Status (Signed)
[place] NOTIFICATION   Patient Details  Name: Henry Mays MRN: [id number] Date of Birth: 04/30/1941   Medicare Observation Status Notification Given:  Yes    Shelbie Ammons, RN 08/27/2016, 9:32 AM

## 2016-08-27 NOTE — Care Management (Signed)
Admitted to [facility] with the diagnosis of syncope under observation status. Lives with wife, Zigmund Daniel, [phone]). Last seen Dr, Bennetta Laos on  Friday of last week. Prescriptions are filled at [facility]. No home Health. No skilled facility. No home oxygen. Uses no aids for ambulation. [place] prior to coming to the hospital. Fair appetite. No weight loss. Takes care of both activities of daily living and instrumental activities. Wife will transport. Shelbie Ammons RN MSN CCM Care Management [phone]

## 2016-08-27 NOTE — H&P (Signed)
[place] @ [facility] Admission History and Physical Harvie Bridge, D.O.  ---------------------------------------------------------------------------------------------------------------------   PATIENT NAME: Henry Mays MR#: [id number] DATE OF BIRTH: August 17, 1941 DATE OF ADMISSION: 08/26/2016 PRIMARY CARE PHYSICIAN: Tommi Rumps, MD  REQUESTING/REFERRING PHYSICIAN: ED Dr. Everett Graff   CHIEF COMPLAINT: Chief Complaint  Patient presents with  . Fall    HISTORY OF PRESENT ILLNESS: Henry Mays is a 75 y.o. male with a known history of Hypertension, Basal cell carcinoma and arthritis was in a usual state of health until last week when he reports diarrhea. He states he developed watery diarrhea last week but denies any recent hospitalizations or recent antibiotics. He was seen in the emergency department this morning for symptoms of diarrhea, fatigue and weakness, was hydrated and discharged on Cipro for presumed infectious diarrhea.. He returns to Hospital his evening because he states that he was on the toilet he felt dizzy and lightheaded as if he was going to pass out. He fell forward hitting his right 4 head on the floor. Denies loss of consciousness.   Patient reports tonight he became short of breath after walking into the toilet. He also complained of fatigue, generalized weakness, fever to 102 last night.  Otherwise there has been no change in status. Patient has been taking medication as prescribed and there has been no recent change in medication or diet.  There has been no recent illness, travel or sick contacts.  He states that he has not had symptoms like this before   EMS/ED COURSE:   Patient received IV fluid hydration and sutures to his right forehead laceration.  PAST MEDICAL HISTORY: Past Medical History:  Diagnosis Date  . Arthritis   . Basal cell carcinoma   . Chickenpox   . Kidney stones       PAST SURGICAL HISTORY: Past Surgical  History:  Procedure Laterality Date  . BASAL CELL CARCINOMA EXCISION    . HERNIA REPAIR        SOCIAL HISTORY: Social History  Substance Use Topics  . Smoking status: Former Research scientist (life sciences)  . Smokeless tobacco: Never Used  . Alcohol use 6.0 oz/week    10 Standard drinks or equivalent per week      FAMILY HISTORY: Family History  Problem Relation Age of Onset  . Arthritis    . Stroke    . Hypertension    . Leukemia Father      MEDICATIONS AT HOME: Prior to Admission medications   Medication Sig Start Date End Date Taking? Authorizing Provider  dexamethasone 0.5 MG/5ML elixir Take 1 mg by mouth daily.  03/27/16  Yes Historical Provider, MD  chlorproMAZINE (THORAZINE) 25 MG tablet Take 1 tablet (25 mg total) by mouth 3 (three) times daily as needed. Patient not taking: Reported on 08/26/2016 08/07/16   Leone Haven, MD  ciprofloxacin (CIPRO) 500 MG tablet Take 1 tablet (500 mg total) by mouth 2 (two) times daily. Patient not taking: Reported on 08/26/2016 08/26/16   Carrie Mew, MD  lisinopril (PRINIVIL,ZESTRIL) 5 MG tablet TAKE 1 TABLET BY MOUTH DAILY Patient not taking: Reported on 08/26/2016 06/25/16   Leone Haven, MD  ondansetron (ZOFRAN ODT) 4 MG disintegrating tablet Take 1 tablet (4 mg total) by mouth every 8 (eight) hours as needed for nausea or vomiting. Patient not taking: Reported on 08/26/2016 08/26/16   Carrie Mew, MD  Patient states he is not taking lisinopril.    DRUG ALLERGIES: No Known Allergies   REVIEW OF SYSTEMS: CONSTITUTIONAL: Positive  fever/chills, fatigue, weakness, negative weight gain/loss, headache EYES: No blurry or double vision. ENT: No tinnitus, postnasal drip, redness or soreness of the oropharynx. RESPIRATORY: No cough, wheeze, hemoptysis, positive dyspnea. CARDIOVASCULAR: No chest pain, orthopnea, palpitations, syncope. GASTROINTESTINAL: No nausea, vomiting, constipation, abdominal pain, hematemesis, melena or hematochezia.  Positive diarrhea GENITOURINARY: No dysuria or hematuria. ENDOCRINE: No polyuria or nocturia. No heat or cold intolerance. HEMATOLOGY: No anemia, bruising, bleeding. INTEGUMENTARY: No rashes, ulcers, lesions. MUSCULOSKELETAL: No arthritis, swelling, gout. NEUROLOGIC: No numbness, tingling, weakness or ataxia. No seizure-type activity. PSYCHIATRIC: No anxiety, depression, insomnia.  PHYSICAL EXAMINATION: VITAL SIGNS: Blood pressure (!) 162/73, pulse (!) 121, temperature 98.7 F (37.1 C), temperature source Oral, resp. rate 20, height 5\' 8"  (1.727 m), weight 68 kg (150 lb), SpO2 98 %.  GENERAL: 75 y.o.-year-old white male patient, well-developed, well-nourished lying in the bed in no acute distress but shivering.  Pleasant and cooperative.   HEENT: Head with sutured right 4 head laceration, normocephalic. Pupils equal, round, reactive to light and accommodation. No scleral icterus. Extraocular muscles intact. Nares are patent. Oropharynx is clear. Mucus membranes moist. NECK: Supple, full range of motion. No JVD, no bruit heard. No thyroid enlargement, no tenderness, no cervical lymphadenopathy. CHEST: Normal breath sounds bilaterally. No wheezing, rales, rhonchi or crackles. No use of accessory muscles of respiration.  No reproducible chest wall tenderness.  CARDIOVASCULAR: S1, S2 normal. No murmurs, rubs, or gallops. Cap refill <2 seconds. ABDOMEN: Soft, nontender, nondistended. No rebound, guarding, rigidity. Normoactive bowel sounds present in all four quadrants. No organomegaly or mass. EXTREMITIES: Full range of motion. No pedal edema, cyanosis, or clubbing. NEUROLOGIC: Cranial nerves II through XII are grossly intact with no focal sensorimotor deficit. Muscle strength 5/5 in all extremities. Sensation intact. Gait not checked. PSYCHIATRIC: The patient is alert and oriented x 3. Normal affect, mood, thought content. SKIN: Warm, dry, and intact without obvious rash, lesion, or ulcer with  the exception of superficial abrasions on both knees and a right lateral elbow laceration  LABORATORY PANEL:  CBC  Recent Labs Lab 08/26/16 1922  WBC 10.2  HGB 12.2*  HCT 33.9*  PLT 261   ----------------------------------------------------------------------------------------------------------------- Chemistries  Recent Labs Lab 08/26/16 1922  NA 130*  K 3.9  CL 97*  CO2 25  GLUCOSE 126*  BUN 13  CREATININE 1.08  CALCIUM 7.9*  MG 1.7  AST 29  ALT 24  ALKPHOS 42  BILITOT 1.1   ------------------------------------------------------------------------------------------------------------------ Cardiac Enzymes  Recent Labs Lab 08/26/16 1922  TROPONINI <0.03   ------------------------------------------------------------------------------------------------------------------  RADIOLOGY: Dg Chest 2 View  Result Date: 08/26/2016 CLINICAL DATA:  Shortness of breath, onset last night. EXAM: CHEST  2 VIEW COMPARISON:  None. FINDINGS: The lungs are hyperinflated. Left basilar atelectasis or scarring. Heart size is normal. There is atherosclerosis of the thoracic aorta. Ill-defined right infrahilar opacity. No pleural fluid or pneumothorax. Chronic change about the right shoulder. IMPRESSION: Ill-defined right infrahilar opacity, favor atelectasis, less likely pneumonia. Linear atelectasis or scarring at the left lung base. Mild hyperinflation. Thoracic aortic atherosclerosis. Electronically Signed   By: Jeb Levering M.D.   On: 08/26/2016 07:02   Ct Head Wo Contrast  Result Date: 08/26/2016 CLINICAL DATA:  Fall.  Head injury.  Right frontal laceration. EXAM: CT HEAD WITHOUT CONTRAST TECHNIQUE: Contiguous axial images were obtained from the base of the skull through the vertex without intravenous contrast. COMPARISON:  None. FINDINGS: Brain: There is a slightly hyperdense 1.4 cm mass at the left foramen of Monro. No definite ventriculomegaly  accounting for generalized cerebral  volume loss. Otherwise no evidence of parenchymal hemorrhage or extra-axial fluid collection. Otherwise no mass lesion, mass effect, or midline shift. No CT evidence of acute infarction. Intracranial atherosclerosis. Vascular: No hyperdense vessel or unexpected calcification. Skull: No evidence of calvarial fracture. Sinuses/Orbits: There are mild layering frothy secretions in the left maxillary and left sphenoid sinus with associated minimal mucoperiosteal thickening. Other: Small right frontal scalp contusion. The mastoid air cells are unopacified. IMPRESSION: 1. Indeterminate slightly hyperdense 1.4 cm mass at the left foramen of Monro. Differential includes a neoplasm such as a subependymoma or colloid cyst. This is not considered to represent hemorrhage. Recommend further evaluation with brain MRI without and with IV contrast when clinically feasible. 2. No definite ventriculomegaly accounting for generalized cerebral volume loss. No midline shift. No acute intracranial hemorrhage. No extra-axial fluid collections. 3. Small right frontal scalp contusion. 4. Mild left paranasal sinusitis, possibly acute. Electronically Signed   By: Ilona Sorrel M.D.   On: 08/26/2016 19:13    EKG:  Normal sinus rhythm at 99 bpm with a normal axis and nonspecific ST and T wave changes.  IMPRESSION AND PLAN:  This is a 75 y.o. male with a history of  hypertension, arthritis and basal cell carcinoma now being admitted with: 1.  Intractable diarrhea, presumed infectious origin-Will admit for IV fluid hydration, stool studies including C. difficile and appropriate antibiotics.  2. Near syncope, likely multi-factorial secondary to dehydration and vasovagal. IV fluids and check orthostatics 3.  Hyponatremia-also secondary to dehydration. We'll provide IV normal saline 4.  Abnormal brain CT with a suspicious 1.4 cm mass in the left foramen of Munro. We'll obtain MRI with and without contrast per radiology recommendations 5.  Anemia, chronic and at baseline. We'll monitor CBC 6. Hypertension-patient not currently taking lisinopril. We'll monitor and treat accordingly.   Diet/Nutrition:  Heart healthy Fluids:  IV normal saline DVT Px: Lovenox, SCDs and early ambulation Code Status: Full  All the records are reviewed and case discussed with ED provider. Management plans discussed with the patient and/or family who express understanding and agree with plan of care.   TOTAL TIME TAKING CARE OF THIS PATIENT: 60 minutes.   Cyrilla Durkin D.O. on 08/27/2016 at 12:43 AM Between 7am to 6pm - Pager - [phone] After 6pm go to [email or web address] - [employer] [facility] [place] [facility] Office [phone] CC: Primary care physician; Tommi Rumps, MD     Note: This dictation was prepared with Dragon dictation along with smaller phrase technology. Any transcriptional errors that result from this process are unintentional.

## 2016-08-27 NOTE — Evaluation (Signed)
Physical Therapy Evaluation Patient Details Name: Henry Mays MRN: [id number] DOB: 10/16/1941 Today's Date: 08/27/2016   History of Present Illness  Henry Mays is a 75 y.o. male with a known history of Hypertension, Basal cell carcinoma and arthritis was in a usual state of health until last week when he reports diarrhea. He states he developed watery diarrhea last week but denies any recent hospitalizations or recent antibiotics. He was seen in the emergency department this morning for symptoms of diarrhea, fatigue and weakness, was hydrated and discharged on Cipro for presumed infectious diarrhea.. He returns to Hospital his evening because he states that he was on the toilet he felt dizzy and lightheaded as if he was going to pass out. He fell forward hitting his right forehead on the floor. Denies loss of consciousness. Patient reports tonight he became short of breath after walking into the toilet. He also complained of fatigue, generalized weakness, fever to 102 last night. Otherwise there has been no change in status. Patient has been taking medication as prescribed and there has been no recent change in medication or diet.  There has been no recent illness, travel or sick contacts.  He states that he has not had symptoms like this before. Pt tested positive for C.diff and is admitted for the same as well as generalized weakness and hyponatremia. No additional falls in the last 12 months.   Clinical Impression  Pt admitted with above diagnosis. Pt currently with functional limitations due to the deficits listed below (see PT Problem List).  Pt demonstrates good stability with transfers and ambulation. He is able to ambulate in hallway without assistive device and no overt LOB. He does demonstrate some slowing of gait with horizontal and vertical head turns. Able to maintain wide and narrow stance without LOB. Negative Rhomberg. Pt reports he feels like his strength is improving but he is still  weaker than baseline. Single leg balance is only 1-2 seconds on each leg. Pt would benefit from [facility] PT to recover strength from illness and hospitalization. He is unclear whether he will agree pending insurance coverage and out of pocket cost. Reviewed some home exercises he can perform. Pt will benefit from skilled PT services to address deficits in strength, balance, and mobility in order to return to full function at home.     Follow Up Recommendations Home health PT    Equipment Recommendations  Cane (single point cane prn, pt instructed to purchase as needed)    Recommendations for Other Services       Precautions / Restrictions Precautions Precautions: Fall Restrictions Weight Bearing Restrictions: No      Mobility  Bed Mobility Overal bed mobility: Modified Independent             General bed mobility comments: Use of bed rails and increased time. HOB elevated  Transfers Overall transfer level: Needs assistance Equipment used: None Transfers: Sit to/from Stand Sit to Stand: Min guard         General transfer comment: Pt requires increased time to come to standing. Cues for safe hand placement. Once upright is steady and stable without UE support.  Ambulation/Gait Ambulation/Gait assistance: Min guard Ambulation Distance (Feet): 150 Feet Assistive device: Rolling walker (2 wheeled);None Gait Pattern/deviations: Decreased step length - right;Decreased step length - left Gait velocity: Decreased but functional for full household mobility Gait velocity interpretation: <1.8 ft/sec, indicative of risk for recurrent falls General Gait Details: Pt able to ambulate in halls approximately 150'. Pt reports increase  in fatigue with ambulation but no signs of DOE. Pt initially begins with rolling walker but is able to progress to no assistive device. Able to perform horizontal and vertical head turns with mild slowing of gait speed. Pt appears to lack confidence but no overt  LOB.   Stairs            Wheelchair Mobility    Modified Rankin (Stroke Patients Only)       Balance Overall balance assessment: Needs assistance Sitting-balance support: No upper extremity supported Sitting balance-Leahy Scale: Good     Standing balance support: No upper extremity supported Standing balance-Leahy Scale: Fair Standing balance comment: Able to maintain wide and narrow stance balance without UE support. Negative Rhomberg                             Pertinent Vitals/Pain Pain Assessment: No/denies pain    Home Living Family/patient expects to be discharged to:: Private residence Living Arrangements: Spouse/significant other Available Help at Discharge: Family Type of Home: House Home Access: Level entry     Home Layout: One level Home Equipment: Grab bars - tub/shower Additional Comments: no assistive deviced    Prior Function Level of Independence: Independent         Comments: Independent with ADLs/IADLs. Drives     Hand Dominance   Dominant Hand: Right    Extremity/Trunk Assessment   Upper Extremity Assessment: Overall WFL for tasks assessed           Lower Extremity Assessment: Generalized weakness         Communication   Communication: No difficulties  Cognition Arousal/Alertness: Awake/alert Behavior During Therapy: WFL for tasks assessed/performed Overall Cognitive Status: Within Functional Limits for tasks assessed                      General Comments      Exercises     Assessment/Plan    PT Assessment Patient needs continued PT services  PT Problem List Decreased strength;Decreased activity tolerance;Decreased balance          PT Treatment Interventions DME instruction;Gait training;Therapeutic activities;Therapeutic exercise;Balance training;Neuromuscular re-education    PT Goals (Current goals can be found in the Care Plan section)  Acute Rehab PT Goals Patient Stated Goal:  Return to prior level of function at home PT Goal Formulation: With patient Time For Goal Achievement: 09/10/16 Potential to Achieve Goals: Good    Frequency Min 2X/week   Barriers to discharge        Co-evaluation               End of Session Equipment Utilized During Treatment: Gait belt Activity Tolerance: Patient tolerated treatment well Patient left: in bed;with call bell/phone within reach      Functional Assessment Tool Used: clinical judgement Functional Limitation: Mobility: Walking and moving around Mobility: Walking and Moving Around Current Status [id number]): At least 20 percent but less than 40 percent impaired, limited or restricted Mobility: Walking and Moving Around Goal Status [id number]): At least 1 percent but less than 20 percent impaired, limited or restricted    Time: 1514-1540 PT Time Calculation (min) (ACUTE ONLY): 26 min   Charges:   PT Evaluation $PT Eval Low Complexity: 1 Procedure PT Treatments $Gait Training: 8-22 mins   PT G Codes:   PT G-Codes **NOT FOR INPATIENT CLASS** Functional Assessment Tool Used: clinical judgement Functional Limitation: Mobility: Walking and moving around Mobility: Walking  and Moving Around Current Status [phone]): At least 20 percent but less than 40 percent impaired, limited or restricted Mobility: Walking and Moving Around Goal Status [phone]): At least 1 percent but less than 20 percent impaired, limited or restricted   Phillips Grout PT, DPT   Ksean Vale 08/27/2016, 3:57 PM

## 2016-08-28 ENCOUNTER — Observation Stay (HOSPITAL_BASED_OUTPATIENT_CLINIC_OR_DEPARTMENT_OTHER)
Admit: 2016-08-28 | Discharge: 2016-08-28 | Disposition: A | Discharge status: Home / Self Care | Payer: Medicare Other | Attending: Internal Medicine | Admitting: Internal Medicine

## 2016-08-28 DIAGNOSIS — I951 Orthostatic hypotension: Secondary | ICD-10-CM

## 2016-08-28 DIAGNOSIS — R9089 Other abnormal findings on diagnostic imaging of central nervous system: Secondary | ICD-10-CM | POA: Diagnosis not present

## 2016-08-28 DIAGNOSIS — Z8619 Personal history of other infectious and parasitic diseases: Secondary | ICD-10-CM

## 2016-08-28 DIAGNOSIS — I493 Ventricular premature depolarization: Secondary | ICD-10-CM | POA: Diagnosis not present

## 2016-08-28 DIAGNOSIS — A047 Enterocolitis due to Clostridium difficile: Principal | ICD-10-CM

## 2016-08-28 DIAGNOSIS — S0990XA Unspecified injury of head, initial encounter: Secondary | ICD-10-CM

## 2016-08-28 DIAGNOSIS — R55 Syncope and collapse: Secondary | ICD-10-CM

## 2016-08-28 DIAGNOSIS — R7989 Other specified abnormal findings of blood chemistry: Secondary | ICD-10-CM | POA: Diagnosis not present

## 2016-08-28 DIAGNOSIS — G508 Other disorders of trigeminal nerve: Secondary | ICD-10-CM | POA: Diagnosis not present

## 2016-08-28 DIAGNOSIS — A0472 Enterocolitis due to Clostridium difficile, not specified as recurrent: Secondary | ICD-10-CM

## 2016-08-28 DIAGNOSIS — E871 Hypo-osmolality and hyponatremia: Secondary | ICD-10-CM | POA: Diagnosis not present

## 2016-08-28 LAB — BASIC METABOLIC PANEL
Anion gap: 6 (ref 5–15)
BUN: 12 mg/dL (ref 6–20)
CHLORIDE: 104 mmol/L (ref 101–111)
CO2: 23 mmol/L (ref 22–32)
CREATININE: 1.02 mg/dL (ref 0.61–1.24)
Calcium: 7.4 mg/dL — ABNORMAL LOW (ref 8.9–10.3)
Glucose, Bld: 129 mg/dL — ABNORMAL HIGH (ref 65–99)
Potassium: 3.9 mmol/L (ref 3.5–5.1)
SODIUM: 133 mmol/L — AB (ref 135–145)

## 2016-08-28 LAB — ECHOCARDIOGRAM COMPLETE
HEIGHTINCHES: 68 in
Weight: 2400 oz

## 2016-08-28 LAB — URINE CULTURE
Culture: NO GROWTH
SPECIAL REQUESTS: NORMAL

## 2016-08-28 LAB — MAGNESIUM: Magnesium: 1.8 mg/dL (ref 1.7–2.4)

## 2016-08-28 MED ORDER — POTASSIUM CHLORIDE CRYS ER 10 MEQ PO TBCR
10.0000 meq | EXTENDED_RELEASE_TABLET | Freq: Every day | ORAL | Status: DC
  Administered 2016-08-28 – 2016-08-29 (×2): 10 meq via ORAL
  Filled 2016-08-28 (×2): qty 1

## 2016-08-28 MED ORDER — VANCOMYCIN 50 MG/ML ORAL SOLUTION
125.0000 mg | Freq: Four times a day (QID) | ORAL | Status: DC
  Administered 2016-08-28 – 2016-09-04 (×28): 125 mg via ORAL
  Filled 2016-08-28 (×29): qty 2.5

## 2016-08-28 MED ORDER — MAGNESIUM OXIDE 400 (241.3 MG) MG PO TABS
400.0000 mg | ORAL_TABLET | Freq: Every day | ORAL | Status: DC
  Administered 2016-08-28 – 2016-08-29 (×2): 400 mg via ORAL
  Filled 2016-08-28 (×3): qty 1

## 2016-08-28 NOTE — Progress Notes (Signed)
[facility] [place] NAME: Henry Mays    MR#:  [id number]  DATE OF BIRTH:  02-24-1941  SUBJECTIVE:   Patient With persistent diarrhea. No abdominal pain, fever or chills. He was able to ambulate yesterday with physical therapy. He usually walks 1 and half miles a day. Did not want to eat very much yesterday.  Had trigeminy this morning and telemetry.  REVIEW OF SYSTEMS:    Review of Systems  Constitutional: Negative for chills, fever and malaise/fatigue.  HENT: Negative.  Negative for ear discharge, ear pain, hearing loss, nosebleeds and sore throat.   Eyes: Negative.  Negative for blurred vision and pain.  Respiratory: Negative.  Negative for cough, hemoptysis, shortness of breath and wheezing.   Cardiovascular: Negative.  Negative for chest pain, palpitations and leg swelling.  Gastrointestinal: Positive for diarrhea. Negative for abdominal pain, blood in stool, nausea and vomiting.  Genitourinary: Negative.  Negative for dysuria.  Musculoskeletal: Negative.  Negative for back pain.  Skin: Negative.   Neurological: Positive for weakness. Negative for dizziness, tremors, speech change, focal weakness, seizures and headaches.  Endo/Heme/Allergies: Negative.  Does not bruise/bleed easily.  Psychiatric/Behavioral: Negative.  Negative for depression, hallucinations and suicidal ideas.    Tolerating Diet:Clear liquid      DRUG ALLERGIES:  No Known Allergies  VITALS:  Blood pressure 126/60, pulse 92, temperature 98.9 F (37.2 C), temperature source Oral, resp. rate 18, height 5\' 8"  (1.727 m), weight 68 kg (150 lb), SpO2 99 %.  PHYSICAL EXAMINATION:   Physical Exam  Constitutional: He is oriented to person, place, and time and well-developed, well-nourished, and in no distress. No distress.  HENT:  Head: Normocephalic.  Eyes: No scleral icterus.  Neck: Normal range of motion. Neck supple. No JVD present. No tracheal deviation  present.  Cardiovascular: Normal rate, regular rhythm and normal heart sounds.  Exam reveals no gallop and no friction rub.   No murmur heard. Pulmonary/Chest: Effort normal and breath sounds normal. No respiratory distress. He has no wheezes. He has no rales. He exhibits no tenderness.  Abdominal: Soft. Bowel sounds are normal. He exhibits no distension and no mass. There is no tenderness. There is no rebound and no guarding.  Musculoskeletal: Normal range of motion. He exhibits no edema.  Neurological: He is alert and oriented to person, place, and time.  Skin: Skin is warm. No rash noted. No erythema.  Sutures on forehead  Psychiatric: Affect and judgment normal.      LABORATORY PANEL:   CBC  Recent Labs Lab 08/27/16 0426  WBC 10.3  HGB 11.8*  HCT 33.2*  PLT 257   ------------------------------------------------------------------------------------------------------------------  Chemistries   Recent Labs Lab 08/26/16 1922  08/28/16 0458  NA 130*  < > 133*  K 3.9  < > 3.9  CL 97*  < > 104  CO2 25  < > 23  GLUCOSE 126*  < > 129*  BUN 13  < > 12  CREATININE 1.08  < > 1.02  CALCIUM 7.9*  < > 7.4*  MG 1.7  --  1.8  AST 29  --   --   ALT 24  --   --   ALKPHOS 42  --   --   BILITOT 1.1  --   --   < > = values in this interval not displayed. ------------------------------------------------------------------------------------------------------------------  Cardiac Enzymes  Recent Labs Lab 08/26/16 1922  TROPONINI <0.03   ------------------------------------------------------------------------------------------------------------------  RADIOLOGY:  Ct Head  Wo Contrast  Result Date: 08/26/2016 CLINICAL DATA:  Fall.  Head injury.  Right frontal laceration. EXAM: CT HEAD WITHOUT CONTRAST TECHNIQUE: Contiguous axial images were obtained from the base of the skull through the vertex without intravenous contrast. COMPARISON:  None. FINDINGS: Brain: There is a slightly  hyperdense 1.4 cm mass at the left foramen of Monro. No definite ventriculomegaly accounting for generalized cerebral volume loss. Otherwise no evidence of parenchymal hemorrhage or extra-axial fluid collection. Otherwise no mass lesion, mass effect, or midline shift. No CT evidence of acute infarction. Intracranial atherosclerosis. Vascular: No hyperdense vessel or unexpected calcification. Skull: No evidence of calvarial fracture. Sinuses/Orbits: There are mild layering frothy secretions in the left maxillary and left sphenoid sinus with associated minimal mucoperiosteal thickening. Other: Small right frontal scalp contusion. The mastoid air cells are unopacified. IMPRESSION: 1. Indeterminate slightly hyperdense 1.4 cm mass at the left foramen of Monro. Differential includes a neoplasm such as a subependymoma or colloid cyst. This is not considered to represent hemorrhage. Recommend further evaluation with brain MRI without and with IV contrast when clinically feasible. 2. No definite ventriculomegaly accounting for generalized cerebral volume loss. No midline shift. No acute intracranial hemorrhage. No extra-axial fluid collections. 3. Small right frontal scalp contusion. 4. Mild left paranasal sinusitis, possibly acute. Electronically Signed   By: Ilona Sorrel M.D.   On: 08/26/2016 19:13   Mr Jeri Cos [id number] Contrast  Result Date: 08/27/2016 CLINICAL DATA:  Abnormal CT of the brain. EXAM: MRI HEAD WITHOUT AND WITH CONTRAST TECHNIQUE: Multiplanar, multiecho pulse sequences of the brain and surrounding structures were obtained without and with intravenous contrast. CONTRAST:  53mL MULTIHANCE GADOBENATE DIMEGLUMINE 529 MG/ML IV SOLN COMPARISON:  CT head without contrast 9/17/710 FINDINGS: Brain: A well-defined nonenhancing T1 hypodense lesion is present at the foramen of Monro. The lesion measures 10 x 15 x 11 mm. There is no associated hydrocephalus. Mild generalized atrophy is within normal limits for age. There  is no significant white matter disease. The ventricles are proportionate to the degree of atrophy. The internal auditory canals are within normal limits bilaterally. The brainstem and cerebellum are normal. A developmental venous anomaly is present in the left cerebellum. No pathologic enhancement is present otherwise. Vascular: Flow is present in the major intracranial arteries. Skull and upper cervical spine: The skullbase is unremarkable. Midline sagittal images demonstrate a 10 mm benign appearing nonenhancing pineal cyst. The upper cervical spine is within normal limits. Sinuses/Orbits: The a small fluid levels present in the left maxillary sinus. Mucosal thickening is present along the floor of the left maxillary sinus. There is minimal mucosal thickening with an anterior left ethmoid air cells as well. The left sphenoid sinus is opacified. No significant right-sided sinus disease is present. The mastoid air cells are clear bilaterally. Other: IMPRESSION: 1. 10 x 15 x 11 mm nonenhancing lesion at the foramen of Monro extending into the left lateral ventricle is compatible with a colloid cyst. 2. No associated hydrocephalus. 3. Mild atrophy is likely within normal limits for age without significant white matter disease. 4. Asymmetric left-sided sinus disease as described. 5. Benign appearing pineal cyst.  No follow-up is necessary. Electronically Signed   By: San Morelle M.D.   On: 08/27/2016 12:06     ASSESSMENT AND PLAN:   75 year old male who presents to diarrhea and found have C. difficile diarrhea.   1. C. difficile diarrhea: Continue Flagyl this is patient's first course of antibiotics for C. Difficile. If he continues to have  profuse diarrhea then I will changes to vancomycin. 2. Generalized weakness due to diarrhea: PT consult is recommending home health  3. Hyponatremia: This is due to dehydration and diarrhea. Improved IV fluids.  4. Colloid cyst: This is currently being  followed as an outpatient. This appears stable from MRI in March.  5. Trigeminy/frequent PVCs: This is likely secondary to diarrhea. Call magnesium level of 2 and potassium of 4. Continue Lopressor 12.5 mg by mouth twice a day. Follow up on echocardiogram. Follow up on cardiology recommendations. Management plans discussed with the patient and he is in agreement.  CODE STATUS: full  TOTAL TIME TAKING CARE OF THIS PATIENT: 25 minutes.    Rounded with nursing staff today POSSIBLE D/C 1-2 days DEPENDING ON CLINICAL CONDITION.   Yukari Flax M.D on 08/28/2016 at 11:07 AM  Between 7am to 6pm - Pager - [phone] After 6pm go to [email or web address] - password [email or web address] [facility]  Office  [phone]  CC: Primary care physician; Tommi Rumps, MD  Note: This dictation was prepared with Dragon dictation along with smaller phrase technology. Any transcriptional errors that result from this process are unintentional.

## 2016-08-28 NOTE — Progress Notes (Signed)
*  PRELIMINARY RESULTS* Echocardiogram 2D Echocardiogram has been performed.  Henry Mays 08/28/2016, 9:48 AM

## 2016-08-28 NOTE — Consult Note (Signed)
Cardiology Consultation Note  Patient ID: Henry Mays, MRN: [id number], DOB/AGE: 75-10-1941 75 y.o. Admit date: 08/26/2016   Date of Consult: 08/28/2016 Primary Physician: Tommi Rumps, MD Primary Cardiologist: New to [facility] Requesting Physician: Dr. Benjie Karvonen, MD  Chief Complaint: Diarrhea Reason for Consult: PVCs  HPI: 75 y.o. male with h/o HTN, basal cell carcinoma, and arthritis who has been dealing with profound diarrhea for greater than 1 week who was found to have C diff. On tele he was noted to have frequent PVCs occasionally in a pattern of bigeminy. He has been asymptomatic.   No previously known cardiac history. He was admitted for diarrhea x 1-[redacted] weeks along with orthostatic hypotension in the setting of dehydration from diarrhea leading to a syncopal event and head contusion. His diarrhea still persists at time of cardiology consult.  Upon reviewing tele he was noted to have frequent PVCs, occasionally in the pattern of ventricular bigeminy. He has been asymptomatic. K+ 3.8-->3.9, Mg++ 1.7-->1.8, TSH normal, hgb 12.2-->11.8. Currently with NSR in the 90's bpm with occasional PVC on tele.     Past Medical History:  Diagnosis Date  . Arthritis   . Basal cell carcinoma   . Chickenpox   . Kidney stones       Most Recent Cardiac Studies: none   Surgical History:  Past Surgical History:  Procedure Laterality Date  . BASAL CELL CARCINOMA EXCISION    . HERNIA REPAIR       Home Meds: Prior to Admission medications   Medication Sig Start Date End Date Taking? Authorizing Provider  dexamethasone 0.5 MG/5ML elixir Take 1 mg by mouth daily.  03/27/16  Yes Historical Provider, MD  chlorproMAZINE (THORAZINE) 25 MG tablet Take 1 tablet (25 mg total) by mouth 3 (three) times daily as needed. Patient not taking: Reported on 08/26/2016 08/07/16   Leone Haven, MD  ciprofloxacin (CIPRO) 500 MG tablet Take 1 tablet (500 mg total) by mouth 2 (two) times daily. Patient not taking:  Reported on 08/26/2016 08/26/16   Carrie Mew, MD  lisinopril (PRINIVIL,ZESTRIL) 5 MG tablet TAKE 1 TABLET BY MOUTH DAILY Patient not taking: Reported on 08/26/2016 06/25/16   Leone Haven, MD  ondansetron (ZOFRAN ODT) 4 MG disintegrating tablet Take 1 tablet (4 mg total) by mouth every 8 (eight) hours as needed for nausea or vomiting. Patient not taking: Reported on 08/26/2016 08/26/16   Carrie Mew, MD    Inpatient Medications:  . enoxaparin (LOVENOX) injection  40 mg Subcutaneous Q24H  . magnesium oxide  400 mg Oral Daily  . mouth rinse  15 mL Mouth Rinse BID  . metroNIDAZOLE  500 mg Oral Q8H  . potassium chloride  10 mEq Oral Daily  . sodium chloride flush  3 mL Intravenous Q12H   . sodium chloride 75 mL/hr at 08/27/16 1950    Allergies: No Known Allergies  Social History   Social History  . Marital status: Married    Spouse name: N/A  . Number of children: N/A  . Years of education: N/A   Occupational History  . Not on file.   Social History Main Topics  . Smoking status: Former Research scientist (life sciences)  . Smokeless tobacco: Never Used  . Alcohol use 6.0 oz/week    10 Standard drinks or equivalent per week  . Drug use: No  . Sexual activity: Not on file   Other Topics Concern  . Not on file   Social History Narrative  . No narrative on file  Family History  Problem Relation Age of Onset  . Arthritis    . Stroke    . Hypertension    . Leukemia Father      Review of Systems: Review of Systems  Constitutional: Positive for malaise/fatigue. Negative for chills, diaphoresis, fever and weight loss.  HENT: Negative for congestion.   Eyes: Negative for discharge and redness.  Respiratory: Negative for cough, sputum production, shortness of breath and wheezing.   Cardiovascular: Negative for chest pain, palpitations, orthopnea, claudication, leg swelling and PND.  Gastrointestinal: Positive for abdominal pain, diarrhea and nausea. Negative for blood in stool,  constipation, heartburn, melena and vomiting.  Musculoskeletal: Negative for falls and myalgias.  Skin: Negative for rash.  Neurological: Positive for weakness. Negative for dizziness, tingling, tremors, sensory change, speech change, focal weakness and loss of consciousness.  Endo/Heme/Allergies: Does not bruise/bleed easily.  Psychiatric/Behavioral: Negative for substance abuse. The patient is not nervous/anxious.   All other systems reviewed and are negative.   Labs:  Recent Labs  08/26/16 1922  TROPONINI <0.03   Lab Results  Component Value Date   WBC 10.3 08/27/2016   HGB 11.8 (L) 08/27/2016   HCT 33.2 (L) 08/27/2016   MCV 93.0 08/27/2016   PLT 257 08/27/2016    Recent Labs Lab 08/26/16 1922  08/28/16 0458  NA 130*  < > 133*  K 3.9  < > 3.9  CL 97*  < > 104  CO2 25  < > 23  BUN 13  < > 12  CREATININE 1.08  < > 1.02  CALCIUM 7.9*  < > 7.4*  PROT 7.5  --   --   BILITOT 1.1  --   --   ALKPHOS 42  --   --   ALT 24  --   --   AST 29  --   --   GLUCOSE 126*  < > 129*  < > = values in this interval not displayed. No results found for: CHOL, HDL, LDLCALC, TRIG No results found for: DDIMER  Radiology/Studies:  Dg Chest 2 View  Result Date: 08/26/2016 CLINICAL DATA:  Shortness of breath, onset last night. EXAM: CHEST  2 VIEW COMPARISON:  None. FINDINGS: The lungs are hyperinflated. Left basilar atelectasis or scarring. Heart size is normal. There is atherosclerosis of the thoracic aorta. Ill-defined right infrahilar opacity. No pleural fluid or pneumothorax. Chronic change about the right shoulder. IMPRESSION: Ill-defined right infrahilar opacity, favor atelectasis, less likely pneumonia. Linear atelectasis or scarring at the left lung base. Mild hyperinflation. Thoracic aortic atherosclerosis. Electronically Signed   By: Jeb Levering M.D.   On: 08/26/2016 07:02   Ct Head Wo Contrast  Result Date: 08/26/2016 CLINICAL DATA:  Fall.  Head injury.  Right frontal  laceration. EXAM: CT HEAD WITHOUT CONTRAST TECHNIQUE: Contiguous axial images were obtained from the base of the skull through the vertex without intravenous contrast. COMPARISON:  None. FINDINGS: Brain: There is a slightly hyperdense 1.4 cm mass at the left foramen of Monro. No definite ventriculomegaly accounting for generalized cerebral volume loss. Otherwise no evidence of parenchymal hemorrhage or extra-axial fluid collection. Otherwise no mass lesion, mass effect, or midline shift. No CT evidence of acute infarction. Intracranial atherosclerosis. Vascular: No hyperdense vessel or unexpected calcification. Skull: No evidence of calvarial fracture. Sinuses/Orbits: There are mild layering frothy secretions in the left maxillary and left sphenoid sinus with associated minimal mucoperiosteal thickening. Other: Small right frontal scalp contusion. The mastoid air cells are unopacified. IMPRESSION: 1. Indeterminate  slightly hyperdense 1.4 cm mass at the left foramen of Monro. Differential includes a neoplasm such as a subependymoma or colloid cyst. This is not considered to represent hemorrhage. Recommend further evaluation with brain MRI without and with IV contrast when clinically feasible. 2. No definite ventriculomegaly accounting for generalized cerebral volume loss. No midline shift. No acute intracranial hemorrhage. No extra-axial fluid collections. 3. Small right frontal scalp contusion. 4. Mild left paranasal sinusitis, possibly acute. Electronically Signed   By: Ilona Sorrel M.D.   On: 08/26/2016 19:13   Mr Jeri Cos [id number] Contrast  Result Date: 08/27/2016 CLINICAL DATA:  Abnormal CT of the brain. EXAM: MRI HEAD WITHOUT AND WITH CONTRAST TECHNIQUE: Multiplanar, multiecho pulse sequences of the brain and surrounding structures were obtained without and with intravenous contrast. CONTRAST:  52mL MULTIHANCE GADOBENATE DIMEGLUMINE 529 MG/ML IV SOLN COMPARISON:  CT head without contrast 9/17/710 FINDINGS: Brain:  A well-defined nonenhancing T1 hypodense lesion is present at the foramen of Monro. The lesion measures 10 x 15 x 11 mm. There is no associated hydrocephalus. Mild generalized atrophy is within normal limits for age. There is no significant white matter disease. The ventricles are proportionate to the degree of atrophy. The internal auditory canals are within normal limits bilaterally. The brainstem and cerebellum are normal. A developmental venous anomaly is present in the left cerebellum. No pathologic enhancement is present otherwise. Vascular: Flow is present in the major intracranial arteries. Skull and upper cervical spine: The skullbase is unremarkable. Midline sagittal images demonstrate a 10 mm benign appearing nonenhancing pineal cyst. The upper cervical spine is within normal limits. Sinuses/Orbits: The a small fluid levels present in the left maxillary sinus. Mucosal thickening is present along the floor of the left maxillary sinus. There is minimal mucosal thickening with an anterior left ethmoid air cells as well. The left sphenoid sinus is opacified. No significant right-sided sinus disease is present. The mastoid air cells are clear bilaterally. Other: IMPRESSION: 1. 10 x 15 x 11 mm nonenhancing lesion at the foramen of Monro extending into the left lateral ventricle is compatible with a colloid cyst. 2. No associated hydrocephalus. 3. Mild atrophy is likely within normal limits for age without significant white matter disease. 4. Asymmetric left-sided sinus disease as described. 5. Benign appearing pineal cyst.  No follow-up is necessary. Electronically Signed   By: San Morelle M.D.   On: 08/27/2016 12:06    EKG: NSR, 99 bpm, nonspecific st/t changes  Telemetry: Interpreted by me showed: Currently in NSR, 90's bpm with occasional PVCs. Has been having frequent PVCs in a pattern of ventricular bigeminy  Weights: Filed Weights   08/26/16 1818  Weight: 150 lb (68 kg)     Physical  Exam: Blood pressure 126/60, pulse 92, temperature 98.9 F (37.2 C), temperature source Oral, resp. rate 18, height 5\' 8"  (1.727 m), weight 150 lb (68 kg), SpO2 99 %. Body mass index is 22.81 kg/m. General: Well developed, well nourished, in no acute distress. Head: Normocephalic, contusion with scabbing along right side of forehead, sclera non-icteric, no xanthomas, nares are without discharge.  Neck: Negative for carotid bruits. JVD not elevated. Lungs: Clear bilaterally to auscultation without wheezes, rales, or rhonchi. Breathing is unlabored. Heart: RRR with S1 S2. No murmurs, rubs, or gallops appreciated. Abdomen: Soft, non-tender, non-distended with normoactive bowel sounds. No hepatomegaly. No rebound/guarding. No obvious abdominal masses. Msk:  Strength and tone appear normal for age. Extremities: No clubbing or cyanosis. No edema. Distal pedal pulses are 2+  and equal bilaterally. Neuro: Alert and oriented X 3. No facial asymmetry. No focal deficit. Moves all extremities spontaneously. Psych:  Responds to questions appropriately with a normal affect.    Assessment and Plan:  Principal Problem:   C. difficile colitis Active Problems:   Orthostatic hypotension   PVC (premature ventricular contraction)    1. Frequent PVCs: -Likely in the setting of the patient's underlying severe diarrhea 2/2 C diff -Asymptomatic  -Non malignant  -Replete potassium to 4.0 and magnesium to 2.0 -Start Lopressor 12.5 mg bid -If ectopy persists as his diarreha resolves consider outpatient cardiac monitor to quantify -TSH normal -Echo pending   2. C diff: -Per IM  3. Orthostatic hypotension: -Patient's fall was in the setting of standing quickly after having a large watery BM and likely 2/2 orthostatic hypotension -No preceding symptoms -IV fluids   Signed, Christell Faith, PA-C [facility] Pager: [phone] 08/28/2016, 9:32 AM

## 2016-08-28 NOTE — Progress Notes (Signed)
Pt has increased his po intake this shift.  Still having loose BMs but less than on previous shift.  Pt up sat up in chair some today and worked with PT.  Pt resting comfortably in bed.

## 2016-08-28 NOTE — Progress Notes (Signed)
Physical Therapy Treatment Patient Details Name: Henry Mays MRN: [id number] DOB: 27-Jan-1941 Today's Date: 08/28/2016    History of Present Illness Olalekan Lehtinen is a 75 y.o. male with a known history of Hypertension, Basal cell carcinoma and arthritis was in a usual state of health until last week when he reports diarrhea. He states he developed watery diarrhea last week but denies any recent hospitalizations or recent antibiotics. He was seen in the emergency department this morning for symptoms of diarrhea, fatigue and weakness, was hydrated and discharged on Cipro for presumed infectious diarrhea.. He returns to Hospital his evening because he states that he was on the toilet he felt dizzy and lightheaded as if he was going to pass out. He fell forward hitting his right forehead on the floor. Denies loss of consciousness. Patient reports tonight he became short of breath after walking into the toilet. He also complained of fatigue, generalized weakness, fever to 102 last night. Otherwise there has been no change in status. Patient has been taking medication as prescribed and there has been no recent change in medication or diet.  There has been no recent illness, travel or sick contacts.  He states that he has not had symptoms like this before. Pt tested positive for C.diff and is admitted for the same as well as generalized weakness and hyponatremia. No additional falls in the last 12 months.     PT Comments    Pt on commode upon arrival but ready to participate.  Pt was able to ambulate in hallway without assistive device and no loss of balance.  He does have decreased step height and left bilaterally.  While he ambulates well inside he may benefit from a [facility] for outside ambulation on uneven surfaces.   Follow Up Recommendations  Home health PT     Equipment Recommendations  Cane    Recommendations for Other Services       Precautions / Restrictions Precautions Precautions:  Fall Restrictions Weight Bearing Restrictions: No    Mobility  Bed Mobility                  Transfers Overall transfer level: Modified independent Equipment used: None Transfers: Sit to/from Stand Sit to Stand: Modified independent (Device/Increase time)         General transfer comment: good hand placements.  Some imbalances but able to recover without assist or device  Ambulation/Gait Ambulation/Gait assistance: Supervision Ambulation Distance (Feet): 200 Feet Assistive device: None Gait Pattern/deviations: Step-through pattern;Decreased step length - right;Decreased step length - left Gait velocity: Decreased but functional for full household mobility Gait velocity interpretation: Below normal speed for age/gender General Gait Details: No assisstive device today with increased distance.  Stated he feels comfortable without assistive device.   Stairs            Wheelchair Mobility    Modified Rankin (Stroke Patients Only)       Balance Overall balance assessment: Modified Independent Sitting-balance support: Feet supported Sitting balance-Leahy Scale: Good     Standing balance support: No upper extremity supported Standing balance-Leahy Scale: Fair                      Cognition Arousal/Alertness: Awake/alert Behavior During Therapy: WFL for tasks assessed/performed Overall Cognitive Status: Within Functional Limits for tasks assessed                      Exercises      General Comments  Pertinent Vitals/Pain Pain Assessment: No/denies pain    Home Living                      Prior Function            PT Goals (current goals can now be found in the care plan section)      Frequency    Min 2X/week      PT Plan Current plan remains appropriate    Co-evaluation             End of Session Equipment Utilized During Treatment: Gait belt Activity Tolerance: Patient tolerated treatment  well Patient left: in chair;with chair alarm set;with call bell/phone within reach     Time: 1115-1141 PT Time Calculation (min) (ACUTE ONLY): 26 min  Charges:                       G Codes:      Chesley Noon 2016-09-09, 1:01 PM

## 2016-08-29 ENCOUNTER — Encounter: Payer: Self-pay | Admitting: Student

## 2016-08-29 DIAGNOSIS — R0600 Dyspnea, unspecified: Secondary | ICD-10-CM | POA: Diagnosis not present

## 2016-08-29 DIAGNOSIS — S0181XA Laceration without foreign body of other part of head, initial encounter: Secondary | ICD-10-CM | POA: Diagnosis present

## 2016-08-29 DIAGNOSIS — R008 Other abnormalities of heart beat: Secondary | ICD-10-CM | POA: Diagnosis not present

## 2016-08-29 DIAGNOSIS — Z87442 Personal history of urinary calculi: Secondary | ICD-10-CM | POA: Diagnosis not present

## 2016-08-29 DIAGNOSIS — R531 Weakness: Secondary | ICD-10-CM | POA: Diagnosis not present

## 2016-08-29 DIAGNOSIS — D649 Anemia, unspecified: Secondary | ICD-10-CM | POA: Diagnosis present

## 2016-08-29 DIAGNOSIS — R55 Syncope and collapse: Secondary | ICD-10-CM | POA: Diagnosis not present

## 2016-08-29 DIAGNOSIS — E876 Hypokalemia: Secondary | ICD-10-CM | POA: Diagnosis not present

## 2016-08-29 DIAGNOSIS — Z806 Family history of leukemia: Secondary | ICD-10-CM | POA: Diagnosis not present

## 2016-08-29 DIAGNOSIS — I493 Ventricular premature depolarization: Secondary | ICD-10-CM | POA: Diagnosis not present

## 2016-08-29 DIAGNOSIS — A047 Enterocolitis due to Clostridium difficile: Secondary | ICD-10-CM | POA: Diagnosis not present

## 2016-08-29 DIAGNOSIS — B370 Candidal stomatitis: Secondary | ICD-10-CM | POA: Diagnosis not present

## 2016-08-29 DIAGNOSIS — R9089 Other abnormal findings on diagnostic imaging of central nervous system: Secondary | ICD-10-CM | POA: Diagnosis not present

## 2016-08-29 DIAGNOSIS — Z87891 Personal history of nicotine dependence: Secondary | ICD-10-CM | POA: Diagnosis not present

## 2016-08-29 DIAGNOSIS — R197 Diarrhea, unspecified: Secondary | ICD-10-CM | POA: Diagnosis not present

## 2016-08-29 DIAGNOSIS — I951 Orthostatic hypotension: Secondary | ICD-10-CM | POA: Diagnosis not present

## 2016-08-29 DIAGNOSIS — Z0389 Encounter for observation for other suspected diseases and conditions ruled out: Secondary | ICD-10-CM | POA: Diagnosis not present

## 2016-08-29 DIAGNOSIS — W19XXXA Unspecified fall, initial encounter: Secondary | ICD-10-CM | POA: Diagnosis present

## 2016-08-29 DIAGNOSIS — E860 Dehydration: Secondary | ICD-10-CM | POA: Diagnosis present

## 2016-08-29 DIAGNOSIS — G930 Cerebral cysts: Secondary | ICD-10-CM | POA: Diagnosis present

## 2016-08-29 DIAGNOSIS — Y92012 Bathroom of single-family (private) house as the place of occurrence of the external cause: Secondary | ICD-10-CM | POA: Diagnosis not present

## 2016-08-29 DIAGNOSIS — A0472 Enterocolitis due to Clostridium difficile, not specified as recurrent: Secondary | ICD-10-CM | POA: Diagnosis present

## 2016-08-29 DIAGNOSIS — I10 Essential (primary) hypertension: Secondary | ICD-10-CM | POA: Diagnosis present

## 2016-08-29 DIAGNOSIS — G508 Other disorders of trigeminal nerve: Secondary | ICD-10-CM | POA: Diagnosis not present

## 2016-08-29 DIAGNOSIS — Z85828 Personal history of other malignant neoplasm of skin: Secondary | ICD-10-CM | POA: Diagnosis not present

## 2016-08-29 DIAGNOSIS — E871 Hypo-osmolality and hyponatremia: Secondary | ICD-10-CM | POA: Diagnosis present

## 2016-08-29 DIAGNOSIS — Z823 Family history of stroke: Secondary | ICD-10-CM | POA: Diagnosis not present

## 2016-08-29 DIAGNOSIS — Z8249 Family history of ischemic heart disease and other diseases of the circulatory system: Secondary | ICD-10-CM | POA: Diagnosis not present

## 2016-08-29 LAB — BASIC METABOLIC PANEL
ANION GAP: 7 (ref 5–15)
BUN: 10 mg/dL (ref 6–20)
CALCIUM: 7.5 mg/dL — AB (ref 8.9–10.3)
CHLORIDE: 105 mmol/L (ref 101–111)
CO2: 22 mmol/L (ref 22–32)
CREATININE: 1.03 mg/dL (ref 0.61–1.24)
GFR calc non Af Amer: >60 mL/min (ref >60)
GLUCOSE: 125 mg/dL — AB (ref 65–99)
Potassium: 4.2 mmol/L (ref 3.5–5.1)
Sodium: 134 mmol/L — ABNORMAL LOW (ref 135–145)

## 2016-08-29 LAB — MAGNESIUM: Magnesium: 1.9 mg/dL (ref 1.7–2.4)

## 2016-08-29 MED ORDER — METOPROLOL TARTRATE 25 MG PO TABS
12.5000 mg | ORAL_TABLET | Freq: Two times a day (BID) | ORAL | Status: DC
  Administered 2016-08-29 (×2): 12.5 mg via ORAL
  Filled 2016-08-29 (×3): qty 1

## 2016-08-29 MED ORDER — NYSTATIN 100000 UNIT/ML MT SUSP
5.0000 mL | Freq: Four times a day (QID) | OROMUCOSAL | Status: DC
  Administered 2016-08-29 – 2016-09-04 (×25): 500000 [IU] via ORAL
  Filled 2016-08-29 (×25): qty 5

## 2016-08-29 NOTE — Progress Notes (Signed)
PT Cancellation Note  Patient Details Name: Henry Mays MRN: [id number] DOB: 02-11-1941   Cancelled Treatment:    Reason Eval/Treat Not Completed: Patient declined, no reason specified   Offered session this am.  Pt stated he had already walked with nursing this am and declined therapy.     Chesley Noon 08/29/2016, 2:16 PM

## 2016-08-29 NOTE — Progress Notes (Signed)
[facility] [place] NAME: Henry Mays    MR#:  [id number]  DATE OF BIRTH:  02-15-1941  SUBJECTIVE:  persistent diarrhea (watery green) but less frequency, had 2 this am. Still not eating much. Getting somewhat stronger and able to walk some REVIEW OF SYSTEMS:    Review of Systems  Constitutional: Negative for chills, fever and malaise/fatigue.  HENT: Negative.  Negative for ear discharge, ear pain, hearing loss, nosebleeds and sore throat.   Eyes: Negative.  Negative for blurred vision and pain.  Respiratory: Negative.  Negative for cough, hemoptysis, shortness of breath and wheezing.   Cardiovascular: Negative.  Negative for chest pain, palpitations and leg swelling.  Gastrointestinal: Positive for diarrhea. Negative for abdominal pain, blood in stool, nausea and vomiting.  Genitourinary: Negative.  Negative for dysuria.  Musculoskeletal: Negative.  Negative for back pain.  Skin: Negative.   Neurological: Positive for weakness. Negative for dizziness, tremors, speech change, focal weakness, seizures and headaches.  Endo/Heme/Allergies: Negative.  Does not bruise/bleed easily.  Psychiatric/Behavioral: Negative.  Negative for depression, hallucinations and suicidal ideas.    Tolerating Diet:soft diet DRUG ALLERGIES:  No Known Allergies VITALS:  Blood pressure 135/65, pulse 99, temperature 98.5 F (36.9 C), temperature source Oral, resp. rate (!) 22, height 5\' 8"  (1.727 m), weight 68 kg (150 lb), SpO2 100 %. PHYSICAL EXAMINATION:   Physical Exam  Constitutional: He is oriented to person, place, and time and well-developed, well-nourished, and in no distress. No distress.  HENT:  Head: Normocephalic.  Eyes: No scleral icterus.  Neck: Normal range of motion. Neck supple. No JVD present. No tracheal deviation present.  Cardiovascular: Normal rate, regular rhythm and normal heart sounds.  Exam reveals no gallop and no friction rub.   No  murmur heard. Pulmonary/Chest: Effort normal and breath sounds normal. No respiratory distress. He has no wheezes. He has no rales. He exhibits no tenderness.  Abdominal: Soft. Bowel sounds are normal. He exhibits no distension and no mass. There is no tenderness. There is no rebound and no guarding.  Musculoskeletal: Normal range of motion. He exhibits no edema.  Neurological: He is alert and oriented to person, place, and time.  Skin: Skin is warm. No rash noted. No erythema.  Sutures on forehead  Psychiatric: Affect and judgment normal.   LABORATORY PANEL:   CBC  Recent Labs Lab 08/27/16 0426  WBC 10.3  HGB 11.8*  HCT 33.2*  PLT 257   ------------------------------------------------------------------------------------------------------------------  Chemistries   Recent Labs Lab 08/26/16 1922  08/29/16 0901  NA 130*  < > 134*  K 3.9  < > 4.2  CL 97*  < > 105  CO2 25  < > 22  GLUCOSE 126*  < > 125*  BUN 13  < > 10  CREATININE 1.08  < > 1.03  CALCIUM 7.9*  < > 7.5*  MG 1.7  < > 1.9  AST 29  --   --   ALT 24  --   --   ALKPHOS 42  --   --   BILITOT 1.1  --   --   < > = values in this interval not displayed.   RADIOLOGY:  Mr Jeri Cos Wo Contrast  Result Date: 08/27/2016 CLINICAL DATA:  Abnormal CT of the brain. EXAM: MRI HEAD WITHOUT AND WITH CONTRAST TECHNIQUE: Multiplanar, multiecho pulse sequences of the brain and surrounding structures were obtained without and with intravenous contrast. CONTRAST:  57mL MULTIHANCE GADOBENATE DIMEGLUMINE  529 MG/ML IV SOLN COMPARISON:  CT head without contrast 9/17/710 FINDINGS: Brain: A well-defined nonenhancing T1 hypodense lesion is present at the foramen of Monro. The lesion measures 10 x 15 x 11 mm. There is no associated hydrocephalus. Mild generalized atrophy is within normal limits for age. There is no significant white matter disease. The ventricles are proportionate to the degree of atrophy. The internal auditory canals are  within normal limits bilaterally. The brainstem and cerebellum are normal. A developmental venous anomaly is present in the left cerebellum. No pathologic enhancement is present otherwise. Vascular: Flow is present in the major intracranial arteries. Skull and upper cervical spine: The skullbase is unremarkable. Midline sagittal images demonstrate a 10 mm benign appearing nonenhancing pineal cyst. The upper cervical spine is within normal limits. Sinuses/Orbits: The a small fluid levels present in the left maxillary sinus. Mucosal thickening is present along the floor of the left maxillary sinus. There is minimal mucosal thickening with an anterior left ethmoid air cells as well. The left sphenoid sinus is opacified. No significant right-sided sinus disease is present. The mastoid air cells are clear bilaterally. Other: IMPRESSION: 1. 10 x 15 x 11 mm nonenhancing lesion at the foramen of Monro extending into the left lateral ventricle is compatible with a colloid cyst. 2. No associated hydrocephalus. 3. Mild atrophy is likely within normal limits for age without significant white matter disease. 4. Asymmetric left-sided sinus disease as described. 5. Benign appearing pineal cyst.  No follow-up is necessary. Electronically Signed   By: San Morelle M.D.   On: 08/27/2016 12:06   ASSESSMENT AND PLAN:  75 year old male who presents to diarrhea and found have C. difficile diarrhea.  1. C. difficile diarrhea: seems like abx changed to PO vancomycin (not sure when and who made change but am ok with it) - still has watery green diarrhea, less frequency  2. Generalized weakness due to diarrhea: PT consult is recommending home health  3. Hyponatremia: This is due to dehydration and diarrhea. Improved IV fluids. 130 -> 134  4. Colloid cyst: This is currently being followed as an outpatient. This appears stable from MRI in March.  5. Trigeminy/frequent PVCs: This is likely secondary to diarrhea. Call  magnesium level of 2 and potassium of 4. Continue Lopressor 12.5 mg by mouth twice a day. normal echocardiogram. Appreciate cardiology recommendations.  Management plans discussed with the patient, Nursing at bedside and they r in agreement.   CODE STATUS: full  TOTAL TIME TAKING CARE OF THIS PATIENT: 25 minutes.    POSSIBLE D/C 1-2 days DEPENDING ON CLINICAL CONDITION. Would like his diarrhea to get somewhat better - still loose watery in nature and frequency, he's not eating much either.   [facility], Rilla Buckman M.D on 08/29/2016 at 10:37 AM  Between 7am to 6pm - Pager - [phone] After 6pm go to [email or web address] - password [email or web address] [facility]  Office  [phone]  CC: Primary care physician; Tommi Rumps, MD  Note: This dictation was prepared with Dragon dictation along with smaller phrase technology. Any transcriptional errors that result from this process are unintentional.

## 2016-08-29 NOTE — Progress Notes (Signed)
Patient: Henry Mays / Admit Date: 08/26/2016 / Date of Encounter: 08/29/2016, 8:14 AM   Subjective: Diarrhea is possibly improving per his report, though "not by much." PVC burden slightly less. Asymptomatic. Labs pending this morning.    Review of Systems: Review of Systems  Constitutional: Positive for malaise/fatigue. Negative for chills, diaphoresis, fever and weight loss.  HENT: Negative for congestion.   Eyes: Negative for discharge and redness.  Respiratory: Negative for cough, sputum production, shortness of breath and wheezing.   Cardiovascular: Negative for chest pain, palpitations, orthopnea, claudication, leg swelling and PND.  Gastrointestinal: Positive for abdominal pain, diarrhea and nausea. Negative for blood in stool, heartburn, melena and vomiting.  Musculoskeletal: Negative for falls and myalgias.  Skin: Negative for rash.  Neurological: Positive for weakness. Negative for dizziness, tingling, tremors, sensory change, speech change, focal weakness and loss of consciousness.  Endo/Heme/Allergies: Does not bruise/bleed easily.  Psychiatric/Behavioral: Negative for substance abuse. The patient is not nervous/anxious.   All other systems reviewed and are negative.   Objective: Telemetry: NSR, 90's bpm, frequent PVCs (decreased from prior), ventricular bigeminy around 5:30 AM Physical Exam: Blood pressure (!) 144/80, pulse 95, temperature 98.6 F (37 C), temperature source Oral, resp. rate 16, height 5\' 8"  (1.727 m), weight 150 lb (68 kg), SpO2 100 %. Body mass index is 22.81 kg/m. General: Weak appearing, in no acute distress. Head: Normocephalic, atraumatic, sclera non-icteric, no xanthomas, nares are without discharge. Neck: Negative for carotid bruits. JVP not elevated. Lungs: Clear bilaterally to auscultation without wheezes, rales, or rhonchi. Breathing is unlabored. Heart: RRR S1 S2 without murmurs, rubs, or gallops.  Abdomen: Soft, non-tender,  non-distended with normoactive bowel sounds. No rebound/guarding. Extremities: No clubbing or cyanosis. No edema. Distal pedal pulses are 2+ and equal bilaterally. Neuro: Alert and oriented X 3. Moves all extremities spontaneously. Psych:  Responds to questions appropriately with a normal affect.   Intake/Output Summary (Last 24 hours) at 08/29/16 0814 Last data filed at 08/28/16 1805  Gross per 24 hour  Intake           816.25 ml  Output              125 ml  Net           691.25 ml    Inpatient Medications:  . enoxaparin (LOVENOX) injection  40 mg Subcutaneous Q24H  . magnesium oxide  400 mg Oral Daily  . mouth rinse  15 mL Mouth Rinse BID  . potassium chloride  10 mEq Oral Daily  . sodium chloride flush  3 mL Intravenous Q12H  . vancomycin  125 mg Oral QID   Infusions:  . sodium chloride 75 mL/hr at 08/28/16 1359    Labs:  Recent Labs  08/26/16 1922 08/27/16 0426 08/28/16 0458  NA 130* 130* 133*  K 3.9 3.8 3.9  CL 97* 100* 104  CO2 25 23 23   GLUCOSE 126* 137* 129*  BUN 13 14 12   CREATININE 1.08 1.11 1.02  CALCIUM 7.9* 7.6* 7.4*  MG 1.7  --  1.8  PHOS 2.8  --   --     Recent Labs  08/26/16 1922  AST 29  ALT 24  ALKPHOS 42  BILITOT 1.1  PROT 7.5  ALBUMIN 3.0*    Recent Labs  08/26/16 1922 08/27/16 0426  WBC 10.2 10.3  NEUTROABS 7.4*  --   HGB 12.2* 11.8*  HCT 33.9* 33.2*  MCV 92.1 93.0  PLT 261 257  Recent Labs  08/26/16 1922  TROPONINI <0.03   Invalid input(s): POCBNP No results for input(s): HGBA1C in the last 72 hours.   Weights: Filed Weights   08/26/16 1818  Weight: 150 lb (68 kg)     Radiology/Studies:  Dg Chest 2 View  Result Date: 08/26/2016 CLINICAL DATA:  Shortness of breath, onset last night. EXAM: CHEST  2 VIEW COMPARISON:  None. FINDINGS: The lungs are hyperinflated. Left basilar atelectasis or scarring. Heart size is normal. There is atherosclerosis of the thoracic aorta. Ill-defined right infrahilar opacity. No  pleural fluid or pneumothorax. Chronic change about the right shoulder. IMPRESSION: Ill-defined right infrahilar opacity, favor atelectasis, less likely pneumonia. Linear atelectasis or scarring at the left lung base. Mild hyperinflation. Thoracic aortic atherosclerosis. Electronically Signed   By: Jeb Levering M.D.   On: 08/26/2016 07:02   Ct Head Wo Contrast  Result Date: 08/26/2016 CLINICAL DATA:  Fall.  Head injury.  Right frontal laceration. EXAM: CT HEAD WITHOUT CONTRAST TECHNIQUE: Contiguous axial images were obtained from the base of the skull through the vertex without intravenous contrast. COMPARISON:  None. FINDINGS: Brain: There is a slightly hyperdense 1.4 cm mass at the left foramen of Monro. No definite ventriculomegaly accounting for generalized cerebral volume loss. Otherwise no evidence of parenchymal hemorrhage or extra-axial fluid collection. Otherwise no mass lesion, mass effect, or midline shift. No CT evidence of acute infarction. Intracranial atherosclerosis. Vascular: No hyperdense vessel or unexpected calcification. Skull: No evidence of calvarial fracture. Sinuses/Orbits: There are mild layering frothy secretions in the left maxillary and left sphenoid sinus with associated minimal mucoperiosteal thickening. Other: Small right frontal scalp contusion. The mastoid air cells are unopacified. IMPRESSION: 1. Indeterminate slightly hyperdense 1.4 cm mass at the left foramen of Monro. Differential includes a neoplasm such as a subependymoma or colloid cyst. This is not considered to represent hemorrhage. Recommend further evaluation with brain MRI without and with IV contrast when clinically feasible. 2. No definite ventriculomegaly accounting for generalized cerebral volume loss. No midline shift. No acute intracranial hemorrhage. No extra-axial fluid collections. 3. Small right frontal scalp contusion. 4. Mild left paranasal sinusitis, possibly acute. Electronically Signed   By: Ilona Sorrel M.D.   On: 08/26/2016 19:13   Mr Jeri Cos [id number] Contrast  Result Date: 08/27/2016 CLINICAL DATA:  Abnormal CT of the brain. EXAM: MRI HEAD WITHOUT AND WITH CONTRAST TECHNIQUE: Multiplanar, multiecho pulse sequences of the brain and surrounding structures were obtained without and with intravenous contrast. CONTRAST:  93mL MULTIHANCE GADOBENATE DIMEGLUMINE 529 MG/ML IV SOLN COMPARISON:  CT head without contrast 9/17/710 FINDINGS: Brain: A well-defined nonenhancing T1 hypodense lesion is present at the foramen of Monro. The lesion measures 10 x 15 x 11 mm. There is no associated hydrocephalus. Mild generalized atrophy is within normal limits for age. There is no significant white matter disease. The ventricles are proportionate to the degree of atrophy. The internal auditory canals are within normal limits bilaterally. The brainstem and cerebellum are normal. A developmental venous anomaly is present in the left cerebellum. No pathologic enhancement is present otherwise. Vascular: Flow is present in the major intracranial arteries. Skull and upper cervical spine: The skullbase is unremarkable. Midline sagittal images demonstrate a 10 mm benign appearing nonenhancing pineal cyst. The upper cervical spine is within normal limits. Sinuses/Orbits: The a small fluid levels present in the left maxillary sinus. Mucosal thickening is present along the floor of the left maxillary sinus. There is minimal mucosal thickening with an anterior left  ethmoid air cells as well. The left sphenoid sinus is opacified. No significant right-sided sinus disease is present. The mastoid air cells are clear bilaterally. Other: IMPRESSION: 1. 10 x 15 x 11 mm nonenhancing lesion at the foramen of Monro extending into the left lateral ventricle is compatible with a colloid cyst. 2. No associated hydrocephalus. 3. Mild atrophy is likely within normal limits for age without significant white matter disease. 4. Asymmetric left-sided sinus  disease as described. 5. Benign appearing pineal cyst.  No follow-up is necessary. Electronically Signed   By: San Morelle M.D.   On: 08/27/2016 12:06     Assessment and Plan  Principal Problem:   C. difficile colitis Active Problems:   Orthostatic hypotension   PVC (premature ventricular contraction)   Diarrhea   Minor head injury   Near syncope    1. Frequent PVCs: -Likely in the setting of the patient's underlying severe diarrhea 2/2 C diff -Asymptomatic  -Non malignant  -Overall frequency has decreased  -Replete potassium to 4.0 and magnesium to 2.0 -Start Lopressor 12.5 mg bid -If ectopy persists as his diarreha resolves consider outpatient cardiac monitor to quantify -TSH normal -Echo with normal LV systolic function at 123456, no RWMA, GR1DD, mild MR, LA normal in size, PASP unable to be estimated  2. C diff: -Per IM  3. Orthostatic hypotension: -Patient's fall was in the setting of standing quickly after having a large watery BM and likely 2/2 orthostatic hypotension -No preceding symptoms -IV fluids  Signed, Christell Faith, PA-C [facility] Pager: [phone] 08/29/2016, 8:14 AM   I have seen, examined and evaluated the patient this PM along with Mr. Purcell Mouton.  After reviewing all the available data and chart, we discussed the patients laboratory, study & physical findings as well as symptoms in detail. I agree with his findings, examination as well as impression recommendations as per our discussion.    Patient is relatively asymptomatic from a cardiac standpoint. He denies any sensation of palpitations or irregular heartbeats. He says his stool is a little less perfused than it had been, but still noticeably loose. He has not had any heart heart failure symptoms or anginal symptoms. He denies any recurrent syncope or near-syncope-symptoms. I agree that he probably had orthostatic hypotension in the setting of dehydration with watery diarrhea. Agree  with hydration. For his PVCs I do agree with low-dose beta blocker. Continue to monitor on telemetry. If persistent, may consider discharge with low-dose beta blocker, but otherwise he probably does not need to be on long-term as he is asymptomatic.  We will follow him on telemetry tomorrow, if he remains in sinus rhythm with minimal PVCs, we will just simply follow along and provides assistance as needed. I suspect that if he is stable for possible rosacea, he probably does not necessarily need cardiology follow-up on discharge.   Leonie Man, M.D., M.S.  [employer]  [address] [place],  [address] [phone] Fax [phone]

## 2016-08-30 LAB — CBC
HEMATOCRIT: 29.9 % — AB (ref 40.0–52.0)
HEMOGLOBIN: 10.7 g/dL — AB (ref 13.0–18.0)
MCH: 33 pg (ref 26.0–34.0)
MCHC: 35.8 g/dL (ref 32.0–36.0)
MCV: 92.3 fL (ref 80.0–100.0)
Platelets: 255 10*3/uL (ref 150–440)
RBC: 3.24 MIL/uL — ABNORMAL LOW (ref 4.40–5.90)
RDW: 13.9 % (ref 11.5–14.5)
WBC: 7.3 10*3/uL (ref 3.8–10.6)

## 2016-08-30 LAB — BASIC METABOLIC PANEL
ANION GAP: 7 (ref 5–15)
BUN: 9 mg/dL (ref 6–20)
CO2: 19 mmol/L — AB (ref 22–32)
Calcium: 7.1 mg/dL — ABNORMAL LOW (ref 8.9–10.3)
Chloride: 106 mmol/L (ref 101–111)
Creatinine, Ser: 1.02 mg/dL (ref 0.61–1.24)
GFR calc non Af Amer: >60 mL/min (ref >60)
Glucose, Bld: 127 mg/dL — ABNORMAL HIGH (ref 65–99)
POTASSIUM: 3.3 mmol/L — AB (ref 3.5–5.1)
Sodium: 132 mmol/L — ABNORMAL LOW (ref 135–145)

## 2016-08-30 LAB — MAGNESIUM: Magnesium: 1.8 mg/dL (ref 1.7–2.4)

## 2016-08-30 LAB — POTASSIUM: POTASSIUM: 4.2 mmol/L (ref 3.5–5.1)

## 2016-08-30 MED ORDER — MAGNESIUM OXIDE 400 (241.3 MG) MG PO TABS
400.0000 mg | ORAL_TABLET | Freq: Two times a day (BID) | ORAL | Status: DC
  Administered 2016-08-30 – 2016-09-04 (×11): 400 mg via ORAL
  Filled 2016-08-30 (×10): qty 1

## 2016-08-30 MED ORDER — MAGNESIUM SULFATE 2 GM/50ML IV SOLN
2.0000 g | Freq: Once | INTRAVENOUS | Status: AC
  Administered 2016-08-30: 16:00:00 2 g via INTRAVENOUS
  Filled 2016-08-30: qty 50

## 2016-08-30 MED ORDER — METOPROLOL TARTRATE 25 MG PO TABS
25.0000 mg | ORAL_TABLET | Freq: Two times a day (BID) | ORAL | Status: DC
  Administered 2016-08-30 – 2016-09-04 (×11): 25 mg via ORAL
  Filled 2016-08-30 (×10): qty 1

## 2016-08-30 MED ORDER — POTASSIUM CHLORIDE CRYS ER 20 MEQ PO TBCR
40.0000 meq | EXTENDED_RELEASE_TABLET | Freq: Once | ORAL | Status: AC
  Administered 2016-08-30: 40 meq via ORAL
  Filled 2016-08-30: qty 2

## 2016-08-30 MED ORDER — POTASSIUM CHLORIDE CRYS ER 20 MEQ PO TBCR: 20.0000 meq | EXTENDED_RELEASE_TABLET | Freq: Every day | ORAL | Status: DC

## 2016-08-30 NOTE — Progress Notes (Signed)
ID E note 75 yo admitted with diarrhea for one week.   C diff +, on Oral vanco, previously on flagyl Had temp 102 last pm.  WBC nml  Rec Cont oral vanco for 14 day course If worsens can add IV flagyl as well.

## 2016-08-30 NOTE — Care Management Important Message (Signed)
Important Message  Patient Details  Name: Alexader Sutherland MRN: [id number] Date of Birth: 12/13/1940   Medicare Important Message Given:  Yes    Shelbie Ammons, RN 08/30/2016, 9:18 AM

## 2016-08-30 NOTE — Progress Notes (Signed)
MEDICATION RELATED CONSULT NOTE - INITIAL   Pharmacy Consult for electrolytes Indication: hypokalemia and hypomagnesemia  No Known Allergies  Patient Measurements: Height: 5\' 8"  (172.7 cm) Weight: 150 lb (68 kg) IBW/kg (Calculated) : 68.4  Vital Signs: Temp: 98.9 F (37.2 C) (09/21 0413) Temp Source: Oral (09/21 0413) BP: 129/59 (09/21 0413) Pulse Rate: 108 (09/21 0413) Intake/Output from previous day: 09/20 0701 - 09/21 0700 In: 1287 [I.V.:1287] Out: 550 [Urine:350; Stool:200] Intake/Output from this shift: No intake/output data recorded.  Labs:  Recent Labs  08/28/16 0458 08/29/16 0901 08/30/16 0424  WBC  --   --  7.3  HGB  --   --  10.7*  HCT  --   --  29.9*  PLT  --   --  255  CREATININE 1.02 1.03 1.02  MG 1.8 1.9 1.8   Estimated Creatinine Clearance: 61.1 mL/min (by C-G formula based on SCr of 1.02 mg/dL).   Microbiology: Recent Results (from the past 720 hour(s))  Gastrointestinal Panel by PCR , Stool     Status: None   Collection Time: 08/26/16  8:41 PM  Result Value Ref Range Status   Campylobacter species NOT DETECTED NOT DETECTED Final   Plesimonas shigelloides NOT DETECTED NOT DETECTED Final   Salmonella species NOT DETECTED NOT DETECTED Final   Yersinia enterocolitica NOT DETECTED NOT DETECTED Final   Vibrio species NOT DETECTED NOT DETECTED Final   Vibrio cholerae NOT DETECTED NOT DETECTED Final   Enteroaggregative E coli (EAEC) NOT DETECTED NOT DETECTED Final   Enteropathogenic E coli (EPEC) NOT DETECTED NOT DETECTED Final   Enterotoxigenic E coli (ETEC) NOT DETECTED NOT DETECTED Final   Shiga like toxin producing E coli (STEC) NOT DETECTED NOT DETECTED Final   E. coli O157 NOT DETECTED NOT DETECTED Final   Shigella/Enteroinvasive E coli (EIEC) NOT DETECTED NOT DETECTED Final   Cryptosporidium NOT DETECTED NOT DETECTED Final   Cyclospora cayetanensis NOT DETECTED NOT DETECTED Final   Entamoeba histolytica NOT DETECTED NOT DETECTED Final   Giardia lamblia NOT DETECTED NOT DETECTED Final   Adenovirus F40/41 NOT DETECTED NOT DETECTED Final   Astrovirus NOT DETECTED NOT DETECTED Final   Norovirus GI/GII NOT DETECTED NOT DETECTED Final   Rotavirus A NOT DETECTED NOT DETECTED Final   Sapovirus (I, II, IV, and V) NOT DETECTED NOT DETECTED Final  C difficile quick scan w PCR reflex     Status: Abnormal   Collection Time: 08/26/16  8:41 PM  Result Value Ref Range Status   C Diff antigen POSITIVE (A) NEGATIVE Final   C Diff toxin POSITIVE (A) NEGATIVE Final   C Diff interpretation Toxin producing C. difficile detected.  Final    Comment: CRITICAL RESULT CALLED TO, READ BACK BY AND VERIFIED WITH:  JERRIE THOMPSON AT 2145 08/26/16 SDR   Urine culture     Status: None   Collection Time: 08/26/16  8:41 PM  Result Value Ref Range Status   Specimen Description URINE, CLEAN CATCH  Final   Special Requests Normal  Final   Culture NO GROWTH Performed at [facility]   Final   Report Status 08/28/2016 FINAL  Final    Medical History: Past Medical History:  Diagnosis Date  . Arthritis   . Basal cell carcinoma   . Chickenpox   . Kidney stones    Assessment: 75yo F with Cdiff  9/21: K 3.3; Mg 1.8  Goal of Therapy:  Mg >2.0 and K >2.0 per MD  Plan:  Will  replace K with KCL 71mEq PO x1. Pt also ordered KCL 35mEq daily. Replaced Mg with Mg sulfate 2g IV x1. Will recheck K later tonight and replace if necessary. Recheck electrolytes in the morning.   Loree Fee, PharmD 08/30/2016,10:59 AM

## 2016-08-30 NOTE — Progress Notes (Signed)
MEDICATION RELATED CONSULT NOTE - INITIAL   Pharmacy Consult for electrolytes Indication: hypokalemia and hypomagnesemia  No Known Allergies  Patient Measurements: Height: 5\' 8"  (172.7 cm) Weight: 150 lb (68 kg) IBW/kg (Calculated) : 68.4  Vital Signs: Temp: 98.2 F (36.8 C) (09/21 1448) Temp Source: Oral (09/21 1448) BP: 117/62 (09/21 1448) Pulse Rate: 85 (09/21 1448) Intake/Output from previous day: 09/20 0701 - 09/21 0700 In: 1287 [I.V.:1287] Out: 550 [Urine:350; Stool:200] Intake/Output from this shift: Total I/O In: 557.5 [I.V.:507.5; IV Piggyback:50] Out: -   Labs:  Recent Labs  08/28/16 0458 08/29/16 0901 08/30/16 0424  WBC  --   --  7.3  HGB  --   --  10.7*  HCT  --   --  29.9*  PLT  --   --  255  CREATININE 1.02 1.03 1.02  MG 1.8 1.9 1.8   Estimated Creatinine Clearance: 61.1 mL/min (by C-G formula based on SCr of 1.02 mg/dL).   Microbiology: Recent Results (from the past 720 hour(s))  Gastrointestinal Panel by PCR , Stool     Status: None   Collection Time: 08/26/16  8:41 PM  Result Value Ref Range Status   Campylobacter species NOT DETECTED NOT DETECTED Final   Plesimonas shigelloides NOT DETECTED NOT DETECTED Final   Salmonella species NOT DETECTED NOT DETECTED Final   Yersinia enterocolitica NOT DETECTED NOT DETECTED Final   Vibrio species NOT DETECTED NOT DETECTED Final   Vibrio cholerae NOT DETECTED NOT DETECTED Final   Enteroaggregative E coli (EAEC) NOT DETECTED NOT DETECTED Final   Enteropathogenic E coli (EPEC) NOT DETECTED NOT DETECTED Final   Enterotoxigenic E coli (ETEC) NOT DETECTED NOT DETECTED Final   Shiga like toxin producing E coli (STEC) NOT DETECTED NOT DETECTED Final   E. coli O157 NOT DETECTED NOT DETECTED Final   Shigella/Enteroinvasive E coli (EIEC) NOT DETECTED NOT DETECTED Final   Cryptosporidium NOT DETECTED NOT DETECTED Final   Cyclospora cayetanensis NOT DETECTED NOT DETECTED Final   Entamoeba histolytica NOT  DETECTED NOT DETECTED Final   Giardia lamblia NOT DETECTED NOT DETECTED Final   Adenovirus F40/41 NOT DETECTED NOT DETECTED Final   Astrovirus NOT DETECTED NOT DETECTED Final   Norovirus GI/GII NOT DETECTED NOT DETECTED Final   Rotavirus A NOT DETECTED NOT DETECTED Final   Sapovirus (I, II, IV, and V) NOT DETECTED NOT DETECTED Final  C difficile quick scan w PCR reflex     Status: Abnormal   Collection Time: 08/26/16  8:41 PM  Result Value Ref Range Status   C Diff antigen POSITIVE (A) NEGATIVE Final   C Diff toxin POSITIVE (A) NEGATIVE Final   C Diff interpretation Toxin producing C. difficile detected.  Final    Comment: CRITICAL RESULT CALLED TO, READ BACK BY AND VERIFIED WITH:  JERRIE THOMPSON AT 2145 08/26/16 SDR   Urine culture     Status: None   Collection Time: 08/26/16  8:41 PM  Result Value Ref Range Status   Specimen Description URINE, CLEAN CATCH  Final   Special Requests Normal  Final   Culture NO GROWTH Performed at [facility]   Final   Report Status 08/28/2016 FINAL  Final    Medical History: Past Medical History:  Diagnosis Date  . Arthritis   . Basal cell carcinoma   . Chickenpox   . Kidney stones    Assessment: 75yo F with Cdiff  Goal of Therapy:  Mg >2.0 and K >2.0 per MD  Plan:  K 4.2 upon recheck. Will f/u AM labs.   Napoleon Form, PharmD 08/30/2016,6:54 PM

## 2016-08-30 NOTE — Progress Notes (Signed)
PT Cancellation Note  Patient Details Name: Henry Mays MRN: [id number] DOB: 07/20/1941   Cancelled Treatment:    Reason Eval/Treat Not Completed: Patient declined, no reason specified   Pt offered and declined session x 2 today.  Will continue as appropriate.   Chesley Noon 08/30/2016, 4:10 PM

## 2016-08-30 NOTE — Progress Notes (Addendum)
[place] at [place] NAME: Henry Mays    MR#:  [id number]  DATE OF BIRTH:  September 05, 1941  SUBJECTIVE:  Has had about 3-4 bowel movements this morning, although he feels it is getting somewhat formed.  Eating some, potassium 3.3, tachycardic, fever upto 102 REVIEW OF SYSTEMS:    Review of Systems  Constitutional: Negative for chills, fever and malaise/fatigue.  HENT: Negative.  Negative for ear discharge, ear pain, hearing loss, nosebleeds and sore throat.   Eyes: Negative.  Negative for blurred vision and pain.  Respiratory: Negative.  Negative for cough, hemoptysis, shortness of breath and wheezing.   Cardiovascular: Negative.  Negative for chest pain, palpitations and leg swelling.  Gastrointestinal: Positive for diarrhea. Negative for abdominal pain, blood in stool, nausea and vomiting.  Genitourinary: Negative.  Negative for dysuria.  Musculoskeletal: Negative.  Negative for back pain.  Skin: Negative.   Neurological: Positive for weakness. Negative for dizziness, tremors, speech change, focal weakness, seizures and headaches.  Endo/Heme/Allergies: Negative.  Does not bruise/bleed easily.  Psychiatric/Behavioral: Negative.  Negative for depression, hallucinations and suicidal ideas.    Tolerating Diet:soft diet DRUG ALLERGIES:  No Known Allergies VITALS:  Blood pressure (!) 129/59, pulse (!) 108, temperature 98.9 F (37.2 C), temperature source Oral, resp. rate 18, height 5\' 8"  (1.727 m), weight 68 kg (150 lb), SpO2 100 %. PHYSICAL EXAMINATION:   Physical Exam  Constitutional: He is oriented to person, place, and time and well-developed, well-nourished, and in no distress. No distress.  HENT:  Head: Normocephalic.  Eyes: No scleral icterus.  Neck: Normal range of motion. Neck supple. No JVD present. No tracheal deviation present.  Cardiovascular: Normal rate, regular rhythm and normal heart sounds.  Exam reveals no gallop and no  friction rub.   No murmur heard. Pulmonary/Chest: Effort normal and breath sounds normal. No respiratory distress. He has no wheezes. He has no rales. He exhibits no tenderness.  Abdominal: Soft. Bowel sounds are normal. He exhibits no distension and no mass. There is no tenderness. There is no rebound and no guarding.  Musculoskeletal: Normal range of motion. He exhibits no edema.  Neurological: He is alert and oriented to person, place, and time.  Skin: Skin is warm. No rash noted. No erythema.  Sutures on forehead  Psychiatric: Affect and judgment normal.   LABORATORY PANEL:   CBC  Recent Labs Lab 08/30/16 0424  WBC 7.3  HGB 10.7*  HCT 29.9*  PLT 255   ------------------------------------------------------------------------------------------------------------------  Chemistries   Recent Labs Lab 08/26/16 1922  08/29/16 0901 08/30/16 0424  NA 130*  < > 134* 132*  K 3.9  < > 4.2 3.3*  CL 97*  < > 105 106  CO2 25  < > 22 19*  GLUCOSE 126*  < > 125* 127*  BUN 13  < > 10 9  CREATININE 1.08  < > 1.03 1.02  CALCIUM 7.9*  < > 7.5* 7.1*  MG 1.7  < > 1.9  --   AST 29  --   --   --   ALT 24  --   --   --   ALKPHOS 42  --   --   --   BILITOT 1.1  --   --   --   < > = values in this interval not displayed.   RADIOLOGY:  No results found. ASSESSMENT AND PLAN:  75 year old male who presents to diarrhea and found have C. difficile diarrhea.  1. C. difficile diarrhea: seems like abx changed to PO vancomycin (not sure when and who made change but am ok with it) - Waiting for it to get more formed and less frequency, slowly improving - now running fever upto 102, will get ID c/s and get 2 sets of blood c/s  2. Generalized weakness due to diarrhea: PT recommends home health  3. Hyponatremia: This is due to dehydration and diarrhea. Improving with IV fluids. 130 -> 134 -> 132  4. Colloid cyst: This is currently being followed as an outpatient. This appears stable from MRI  in March.  5. Trigeminy/frequent PVCs: This is likely secondary to diarrhea. Call magnesium level of 2 and potassium of 4. Continue Lopressor 12.5 mg by mouth twice a day. normal echocardiogram. Appreciate cardiology recommendations. - We will consult pharmacy for electrolytes monitoring  Management plans discussed with the patient, Nursing at bedside and they r in agreement.   CODE STATUS: full  TOTAL TIME TAKING CARE OF THIS PATIENT: 25 minutes.    POSSIBLE D/C 1-2 days DEPENDING ON CLINICAL CONDITION. Would like his diarrhea to get somewhat better - Waiting for it to get more formed and less frequency.  [facility] Melia Hopes M.D on 08/30/2016 at 8:09 AM  Between 7am to 6pm - Pager - [phone] After 6pm go to [email or web address] - password [email or web address] [facility]  Office  [phone]  CC: Primary care physician; Tommi Rumps, MD  Note: This dictation was prepared with Dragon dictation along with smaller phrase technology. Any transcriptional errors that result from this process are unintentional.

## 2016-08-30 NOTE — Progress Notes (Signed)
   Telemetry reviewed showing much improved PVC burden this morning. Frequent PVCs overnight. Magnesium 1.8 this morning. K+ down to 3.3 this morning from 4.2 the day prior. Continue electrolyte repletion. Will increase magnesium oxide to 400 mg bid. Increase Lopressor to 25 mg bid given HR and PVC burden overnight.

## 2016-08-31 ENCOUNTER — Inpatient Hospital Stay: Payer: Medicare Other

## 2016-08-31 LAB — EXPECTORATED SPUTUM ASSESSMENT W GRAM STAIN, RFLX TO RESP C: Special Requests: NORMAL

## 2016-08-31 LAB — BASIC METABOLIC PANEL
Anion gap: 5 (ref 5–15)
BUN: 9 mg/dL (ref 6–20)
CALCIUM: 7.1 mg/dL — AB (ref 8.9–10.3)
CO2: 22 mmol/L (ref 22–32)
CREATININE: 0.98 mg/dL (ref 0.61–1.24)
Chloride: 107 mmol/L (ref 101–111)
GFR calc non Af Amer: >60 mL/min (ref >60)
GLUCOSE: 114 mg/dL — AB (ref 65–99)
Potassium: 3.8 mmol/L (ref 3.5–5.1)
Sodium: 134 mmol/L — ABNORMAL LOW (ref 135–145)

## 2016-08-31 LAB — CBC
HEMATOCRIT: 29.9 % — AB (ref 40.0–52.0)
Hemoglobin: 10.3 g/dL — ABNORMAL LOW (ref 13.0–18.0)
MCH: 31.8 pg (ref 26.0–34.0)
MCHC: 34.4 g/dL (ref 32.0–36.0)
MCV: 92.5 fL (ref 80.0–100.0)
Platelets: 241 10*3/uL (ref 150–440)
RBC: 3.24 MIL/uL — ABNORMAL LOW (ref 4.40–5.90)
RDW: 13.9 % (ref 11.5–14.5)
WBC: 6.4 10*3/uL (ref 3.8–10.6)

## 2016-08-31 LAB — EXPECTORATED SPUTUM ASSESSMENT W REFEX TO RESP CULTURE

## 2016-08-31 LAB — PHOSPHORUS: Phosphorus: 1.7 mg/dL — ABNORMAL LOW (ref 2.5–4.6)

## 2016-08-31 LAB — MAGNESIUM: Magnesium: 2.3 mg/dL (ref 1.7–2.4)

## 2016-08-31 MED ORDER — POTASSIUM CHLORIDE CRYS ER 20 MEQ PO TBCR
40.0000 meq | EXTENDED_RELEASE_TABLET | Freq: Every day | ORAL | Status: DC
  Administered 2016-08-31 – 2016-09-03 (×4): 40 meq via ORAL
  Filled 2016-08-31 (×4): qty 2

## 2016-08-31 MED ORDER — POTASSIUM & SODIUM PHOSPHATES 280-160-250 MG PO PACK
1.0000 | PACK | Freq: Three times a day (TID) | ORAL | Status: DC
  Administered 2016-08-31 – 2016-09-01 (×7): 1 via ORAL
  Filled 2016-08-31 (×8): qty 1

## 2016-08-31 NOTE — Progress Notes (Signed)
MEDICATION RELATED CONSULT NOTE - INITIAL   Pharmacy Consult for electrolytes Indication: hypokalemia and hypomagnesemia  No Known Allergies  Patient Measurements: Height: 5\' 8"  (172.7 cm) Weight: 150 lb (68 kg) IBW/kg (Calculated) : 68.4  Vital Signs: Temp: 98.8 F (37.1 C) (09/22 0357) Temp Source: Oral (09/22 0357) BP: 124/62 (09/22 0357) Pulse Rate: 94 (09/22 0357) Intake/Output from previous day: 09/21 0701 - 09/22 0700 In: 557.5 [I.V.:507.5; IV Piggyback:50] Out: -  Intake/Output from this shift: No intake/output data recorded.  Labs:  Recent Labs  08/29/16 0901 08/30/16 0424 08/31/16 0512  WBC  --  7.3 6.4  HGB  --  10.7* 10.3*  HCT  --  29.9* 29.9*  PLT  --  255 241  CREATININE 1.03 1.02 0.98  MG 1.9 1.8 2.3  PHOS  --   --  1.7*   Estimated Creatinine Clearance: 63.6 mL/min (by C-G formula based on SCr of 0.98 mg/dL).   Microbiology: Recent Results (from the past 720 hour(s))  Gastrointestinal Panel by PCR , Stool     Status: None   Collection Time: 08/26/16  8:41 PM  Result Value Ref Range Status   Campylobacter species NOT DETECTED NOT DETECTED Final   Plesimonas shigelloides NOT DETECTED NOT DETECTED Final   Salmonella species NOT DETECTED NOT DETECTED Final   Yersinia enterocolitica NOT DETECTED NOT DETECTED Final   Vibrio species NOT DETECTED NOT DETECTED Final   Vibrio cholerae NOT DETECTED NOT DETECTED Final   Enteroaggregative E coli (EAEC) NOT DETECTED NOT DETECTED Final   Enteropathogenic E coli (EPEC) NOT DETECTED NOT DETECTED Final   Enterotoxigenic E coli (ETEC) NOT DETECTED NOT DETECTED Final   Shiga like toxin producing E coli (STEC) NOT DETECTED NOT DETECTED Final   E. coli O157 NOT DETECTED NOT DETECTED Final   Shigella/Enteroinvasive E coli (EIEC) NOT DETECTED NOT DETECTED Final   Cryptosporidium NOT DETECTED NOT DETECTED Final   Cyclospora cayetanensis NOT DETECTED NOT DETECTED Final   Entamoeba histolytica NOT DETECTED NOT  DETECTED Final   Giardia lamblia NOT DETECTED NOT DETECTED Final   Adenovirus F40/41 NOT DETECTED NOT DETECTED Final   Astrovirus NOT DETECTED NOT DETECTED Final   Norovirus GI/GII NOT DETECTED NOT DETECTED Final   Rotavirus A NOT DETECTED NOT DETECTED Final   Sapovirus (I, II, IV, and V) NOT DETECTED NOT DETECTED Final  C difficile quick scan w PCR reflex     Status: Abnormal   Collection Time: 08/26/16  8:41 PM  Result Value Ref Range Status   C Diff antigen POSITIVE (A) NEGATIVE Final   C Diff toxin POSITIVE (A) NEGATIVE Final   C Diff interpretation Toxin producing C. difficile detected.  Final    Comment: CRITICAL RESULT CALLED TO, READ BACK BY AND VERIFIED WITH:  JERRIE THOMPSON AT 2145 08/26/16 SDR   Urine culture     Status: None   Collection Time: 08/26/16  8:41 PM  Result Value Ref Range Status   Specimen Description URINE, CLEAN CATCH  Final   Special Requests Normal  Final   Culture NO GROWTH Performed at [facility]   Final   Report Status 08/28/2016 FINAL  Final  CULTURE, BLOOD (ROUTINE X 2) w Reflex to ID Panel     Status: None (Preliminary result)   Collection Time: 08/30/16 10:22 AM  Result Value Ref Range Status   Specimen Description BLOOD LEFT FA  Final   Special Requests   Final    BOTTLES DRAWN AEROBIC AND ANAEROBIC  AER 3ML ANA 2ML   Culture NO GROWTH < 24 HOURS  Final   Report Status PENDING  Incomplete  CULTURE, BLOOD (ROUTINE X 2) w Reflex to ID Panel     Status: None (Preliminary result)   Collection Time: 08/30/16 10:22 AM  Result Value Ref Range Status   Specimen Description BLOOD LEFT AC  Final   Special Requests BOTTLES DRAWN AEROBIC AND ANAEROBIC 2ML  Final   Culture NO GROWTH < 24 HOURS  Final   Report Status PENDING  Incomplete    Medical History: Past Medical History:  Diagnosis Date  . Arthritis   . Basal cell carcinoma   . Chickenpox   . Kidney stones    Assessment: 75yo F with Cdiff  9/21: K 3.3; Mg 1.8 9/22: K 3.8; Mg  2.3; Phos 1.7  Goal of Therapy:  Mg >2.0 and K >2.0 per MD  Plan:  Current orders for KCL 73mEq PO x1 and potassium & sodium phosphates packet 280-160-250mg . Orders for magnesium oxide 400mg  BID.   Loree Fee, PharmD 08/31/2016,7:18 AM

## 2016-08-31 NOTE — Progress Notes (Signed)
[place] at [place] NAME: Henry Mays    MR#:  [id number]  DATE OF BIRTH:  Dec 27, 1940  SUBJECTIVE:  Had 3-4 bowel movements this morning, somewhat formed. Low grade fever, weak. Now having sputum expectorated REVIEW OF SYSTEMS:    Review of Systems  Constitutional: Positive for malaise/fatigue. Negative for chills and fever.  HENT: Negative.  Negative for ear discharge, ear pain, hearing loss, nosebleeds and sore throat.   Eyes: Negative.  Negative for blurred vision and pain.  Respiratory: Positive for cough and sputum production. Negative for hemoptysis, shortness of breath and wheezing.   Cardiovascular: Negative.  Negative for chest pain, palpitations and leg swelling.  Gastrointestinal: Positive for diarrhea. Negative for abdominal pain, blood in stool, nausea and vomiting.  Genitourinary: Negative.  Negative for dysuria.  Musculoskeletal: Negative.  Negative for back pain.  Skin: Negative.   Neurological: Positive for weakness. Negative for dizziness, tremors, speech change, focal weakness, seizures and headaches.  Endo/Heme/Allergies: Negative.  Does not bruise/bleed easily.  Psychiatric/Behavioral: Negative.  Negative for depression, hallucinations and suicidal ideas.    Tolerating Diet:soft diet DRUG ALLERGIES:  No Known Allergies VITALS:  Blood pressure 124/60, pulse 88, temperature 99.4 F (37.4 C), temperature source Oral, resp. rate 17, height 5\' 8"  (1.727 m), weight 68 kg (150 lb), SpO2 98 %. PHYSICAL EXAMINATION:   Physical Exam  Constitutional: He is oriented to person, place, and time and well-developed, well-nourished, and in no distress. No distress.  HENT:  Head: Normocephalic.  Eyes: No scleral icterus.  Neck: Normal range of motion. Neck supple. No JVD present. No tracheal deviation present.  Cardiovascular: Normal rate, regular rhythm and normal heart sounds.  Exam reveals no gallop and no friction rub.   No  murmur heard. Pulmonary/Chest: Effort normal and breath sounds normal. No respiratory distress. He has no wheezes. He has no rales. He exhibits no tenderness.  Abdominal: Soft. Bowel sounds are normal. He exhibits no distension and no mass. There is no tenderness. There is no rebound and no guarding.  Musculoskeletal: Normal range of motion. He exhibits no edema.  Neurological: He is alert and oriented to person, place, and time.  Skin: Skin is warm. No rash noted. No erythema.  Sutures on forehead  Psychiatric: Affect and judgment normal.   LABORATORY PANEL:   CBC  Recent Labs Lab 08/31/16 0512  WBC 6.4  HGB 10.3*  HCT 29.9*  PLT 241   ------------------------------------------------------------------------------------------------------------------  Chemistries   Recent Labs Lab 08/26/16 1922  08/31/16 0512  NA 130*  < > 134*  K 3.9  < > 3.8  CL 97*  < > 107  CO2 25  < > 22  GLUCOSE 126*  < > 114*  BUN 13  < > 9  CREATININE 1.08  < > 0.98  CALCIUM 7.9*  < > 7.1*  MG 1.7  < > 2.3  AST 29  --   --   ALT 24  --   --   ALKPHOS 42  --   --   BILITOT 1.1  --   --   < > = values in this interval not displayed.   RADIOLOGY:  Dg Chest 2 View  Result Date: 08/31/2016 CLINICAL DATA:  Testicular infection.  Former smoker.  Dyspnea. EXAM: CHEST  2 VIEW COMPARISON:  08/26/2016 FINDINGS: There is bibasilar atelectasis. There is no focal parenchymal opacity. There is no pleural effusion or pneumothorax. The heart and mediastinal contours are unremarkable.  There is severe osteoarthritis of the right glenohumeral joint. IMPRESSION: No active cardiopulmonary disease. Electronically Signed   By: Kathreen Devoid   On: 08/31/2016 10:43   ASSESSMENT AND PLAN:  75 year old male who presents to diarrhea and found have C. difficile diarrhea.  1. C. difficile diarrhea: continue PO vancomycin but if no improvement can add IV flagyl - Waiting for it to get more formed and less frequency,  slowly improving - fever curve improving, blood c/s neg  2. Generalized weakness due to diarrhea: PT recommends home health but wife worried and is requesting STR -cm aware.  3. Hyponatremia: This is due to dehydration and diarrhea. Improving with IV fluids. 130 -> 134   4. Bigeminy/PVCs - continues to have Frequent PVCs/Bigeminy. Magnesium 1.7 this morning. K+ down to 3.8. Pharmacy helping with electrolyte repletion.  - continue Lopressor to 25 mg bid given HR and PVC burden  - Cardio following - Normal Echo  5. Cough with sputum production - CXR this am - neg - monitor - sputum c/s if able  6. Colloid cyst: followed as an outpatient. This appears stable from MRI in March.    Management plans discussed with the patient, Nursing at bedside and they r in agreement.   CODE STATUS: full  TOTAL TIME TAKING CARE OF THIS PATIENT: 25 minutes.    POSSIBLE D/C 1-2 days DEPENDING ON CLINICAL CONDITION. Would like his diarrhea to get somewhat better - Waiting for it to get more formed and less frequency.  [facility], Antionne Enrique M.D on 08/31/2016 at 2:45 PM  Between 7am to 6pm - Pager - [phone] After 6pm go to [email or web address] - password [email or web address] [facility]  Office  [phone]  CC: Primary care physician; Tommi Rumps, MD  Note: This dictation was prepared with Dragon dictation along with smaller phrase technology. Any transcriptional errors that result from this process are unintentional.

## 2016-08-31 NOTE — Care Management (Signed)
Spoke with wife yesterday. States that she can't take care of her husband in the home. Would like husband to go to [facility] unit for short term rehabilitation, if possible. Discussed that home health and physical therapy in the home were recommends per PT Shelbie Ammons RN MSN [facility] [phone]

## 2016-08-31 NOTE — Progress Notes (Signed)
Dr. Marcille Blanco paged re: K 3.8, P 1.7.

## 2016-08-31 NOTE — Progress Notes (Signed)
Physical Therapy Treatment Patient Details Name: Henry Mays MRN: [id number] DOB: September 08, 1941 Today's Date: 08/31/2016    History of Present Illness Henry Mays is a 75 y.o. male with a known history of Hypertension, Basal cell carcinoma and arthritis was in a usual state of health until last week when he reports diarrhea. He states he developed watery diarrhea last week but denies any recent hospitalizations or recent antibiotics. He was seen in the emergency department this morning for symptoms of diarrhea, fatigue and weakness, was hydrated and discharged on Cipro for presumed infectious diarrhea.. He returns to Hospital his evening because he states that he was on the toilet he felt dizzy and lightheaded as if he was going to pass out. He fell forward hitting his right forehead on the floor. Denies loss of consciousness. Patient reports tonight he became short of breath after walking into the toilet. He also complained of fatigue, generalized weakness, fever to 102 last night. Otherwise there has been no change in status. Patient has been taking medication as prescribed and there has been no recent change in medication or diet.  There has been no recent illness, travel or sick contacts.  He states that he has not had symptoms like this before. Pt tested positive for C.diff and is admitted for the same as well as generalized weakness and hyponatremia. No additional falls in the last 12 months.     PT Comments    Pt did well with PT walking around the nurses' station twice and generally showing good safety/confidence.  He did not have any LOBs and only minimal stagger steps but does have some fatigue with the effort and reports generally walking slower and less fluidly than his baseline.  He did well with LE exercises, we discussed UE exercises for home regarding his shoulder issues.  Pt did well, states he feels like he would rather go home with home health than considering any rehab.  Follow Up  Recommendations  Home health PT     Equipment Recommendations       Recommendations for Other Services       Precautions / Restrictions Precautions Precautions: Fall Restrictions Weight Bearing Restrictions: No    Mobility  Bed Mobility Overal bed mobility: Modified Independent             General bed mobility comments: Pt with difficult time using R U&LEs effectively during transition to/from sitting.  Overall showed good effort.   Transfers Overall transfer level: Modified independent Equipment used: None Transfers: Sit to/from Stand Sit to Stand: Modified independent (Device/Increase time)         General transfer comment: Pt initially needing to rock back/forth to gain momentum to rise, but able to get to standing w/o direct assist  Ambulation/Gait Ambulation/Gait assistance: Supervision Ambulation Distance (Feet): 450 Feet Assistive device: None       General Gait Details: Pt did well ambulating w/o AD and w/o excessive cuing.  He still reports feeling much slower than his normal and did respond to some cuing to improve this.  Pt with no LOBs and though he had some fatigue after the effort it was not excessive.   Stairs            Wheelchair Mobility    Modified Rankin (Stroke Patients Only)       Balance     Sitting balance-Leahy Scale: Good       Standing balance-Leahy Scale: Good  Cognition Arousal/Alertness: Awake/alert Behavior During Therapy: WFL for tasks assessed/performed Overall Cognitive Status: Within Functional Limits for tasks assessed                      Exercises General Exercises - Lower Extremity Quad Sets: Strengthening;10 reps Heel Slides: Strengthening;10 reps Hip ABduction/ADduction: Strengthening;10 reps Straight Leg Raises: AROM;10 reps    General Comments        Pertinent Vitals/Pain Pain Assessment:  (general chronic R shoulder pain)    Home Living                       Prior Function            PT Goals (current goals can now be found in the care plan section) Progress towards PT goals: Progressing toward goals    Frequency    Min 2X/week      PT Plan Current plan remains appropriate    Co-evaluation             End of Session Equipment Utilized During Treatment: Gait belt Activity Tolerance: Patient tolerated treatment well Patient left: with bed alarm set;with call bell/phone within reach     Time: [id number] PT Time Calculation (min) (ACUTE ONLY): 25 min  Charges:  $Gait Training: 8-22 mins $Therapeutic Exercise: 8-22 mins                    G Codes:      Kreg Shropshire, DPT 08/31/2016, 4:09 PM

## 2016-09-01 ENCOUNTER — Inpatient Hospital Stay: Payer: Medicare Other

## 2016-09-01 LAB — BASIC METABOLIC PANEL
ANION GAP: 7 (ref 5–15)
BUN: 8 mg/dL (ref 6–20)
CO2: 21 mmol/L — ABNORMAL LOW (ref 22–32)
Calcium: 6.8 mg/dL — ABNORMAL LOW (ref 8.9–10.3)
Chloride: 106 mmol/L (ref 101–111)
Creatinine, Ser: 0.89 mg/dL (ref 0.61–1.24)
Glucose, Bld: 110 mg/dL — ABNORMAL HIGH (ref 65–99)
POTASSIUM: 3.5 mmol/L (ref 3.5–5.1)
SODIUM: 134 mmol/L — AB (ref 135–145)

## 2016-09-01 LAB — MAGNESIUM: MAGNESIUM: 1.9 mg/dL (ref 1.7–2.4)

## 2016-09-01 LAB — PHOSPHORUS: Phosphorus: 1.8 mg/dL — ABNORMAL LOW (ref 2.5–4.6)

## 2016-09-01 MED ORDER — POTASSIUM PHOSPHATES 15 MMOLE/5ML IV SOLN
15.0000 mmol | Freq: Once | INTRAVENOUS | Status: AC
  Administered 2016-09-01: 14:00:00 15 mmol via INTRAVENOUS
  Filled 2016-09-01: qty 5

## 2016-09-01 MED ORDER — MAGNESIUM SULFATE IN D5W 1-5 GM/100ML-% IV SOLN
1.0000 g | Freq: Once | INTRAVENOUS | Status: AC
Start: 2016-09-01 — End: 2016-09-01
  Administered 2016-09-01: 11:00:00 1 g via INTRAVENOUS
  Filled 2016-09-01: qty 100

## 2016-09-01 NOTE — Plan of Care (Signed)
Problem: Bowel/Gastric: Goal: Will not experience complications related to bowel motility Outcome: Not Progressing Patient is still noted to have multiple episodes of watery diarrhea. Continued to be on enteric precautions for c-diff. On PO Vancomycin.  Kept safe and comfortable. Educated on how to prevent spread of c-diff.

## 2016-09-01 NOTE — Progress Notes (Signed)
MEDICATION RELATED CONSULT NOTE -follow up  Pharmacy Consult for electrolytes Indication: hypokalemia and hypomagnesemia  No Known Allergies  Patient Measurements: Height: 5\' 8"  (172.7 cm) Weight: 150 lb (68 kg) IBW/kg (Calculated) : 68.4  Vital Signs: Temp: 98.4 F (36.9 C) (09/23 0403) Temp Source: Oral (09/23 0403) BP: 141/62 (09/23 0403) Pulse Rate: 96 (09/23 0403) Intake/Output from previous day: 09/22 0701 - 09/23 0700 In: 2151.7 [P.O.:240; I.V.:1911.7] Out: 550 [Urine:350; Stool:200] Intake/Output from this shift: No intake/output data recorded.  Labs:  Recent Labs  08/30/16 0424 08/31/16 0512 09/01/16 0543  WBC 7.3 6.4  --   HGB 10.7* 10.3*  --   HCT 29.9* 29.9*  --   PLT 255 241  --   CREATININE 1.02 0.98 0.89  MG 1.8 2.3 1.9  PHOS  --  1.7* 1.8*    Lab Results  Component Value Date   K 3.5 09/01/2016   Estimated Creatinine Clearance: 70 mL/min (by C-G formula based on SCr of 0.89 mg/dL).   Medical History: Past Medical History:  Diagnosis Date  . Arthritis   . Basal cell carcinoma   . Chickenpox   . Kidney stones    Assessment: 75yo F with Cdiff  9/21: K 3.3; Mg 1.8 9/22: K 3.8; Mg 2.3; Phos 1.7 9/23  K 3.5, Mg 1.9, Phos 1.8   Goal of Therapy:  Mg >2.0 and K >2.0 per MD  Plan:  Current orders for KCL 36mEq PO daily, Potassium & sodium Phosphates packet 280-160-250mg  4x daily and magnesium oxide 400mg  BID.   9/23  Will give Magnesium 1 gram IV x1 and KPhos 15 mmol IV x 1. F/u am labs.  Juneau Doughman A, PharmD 09/01/2016,9:32 AM

## 2016-09-01 NOTE — Progress Notes (Signed)
Patient ID: Henry Mays, male   DOB: 06-Jun-1941, 75 y.o.   MRN: [id number]  CXR report reviewed. No signs of aspiration or infiltrates. Awaiting results of speech evaluation. Will adjust diet if needed depending on results.

## 2016-09-01 NOTE — Progress Notes (Signed)
Pt choked on food while eating lunch.  When nurse tech arrived to room pt was purple in the face.  Pt was able to clear airway and vitals WNL.  Dr. Edwina Barth made aware and new orders received for chest xray and speech consults.  Clarise Cruz, RN

## 2016-09-01 NOTE — Progress Notes (Signed)
Subjective: Continues to have loose stools but says they are less frequent today.  Objective: Vital signs in last 24 hours: Temp:  [98.4 F (36.9 C)-100.7 F (38.2 C)] 98.4 F (36.9 C) (09/23 0403) Pulse Rate:  [95-103] 96 (09/23 0403) Resp:  [17-18] 18 (09/23 0403) BP: (130-141)/(62-73) 141/62 (09/23 0403) SpO2:  [96 %-100 %] 96 % (09/23 0403) Weight change:  Last BM Date: 08/31/16  Intake/Output from previous day: 09/22 0701 - 09/23 0700 In: 2151.7 [P.O.:240; I.V.:1911.7] Out: 550 [Urine:350; Stool:200] Intake/Output this shift: No intake/output data recorded.  General appearance: alert and cooperative Head: Normocephalic, without obvious abnormality, atraumatic, scalp contusion Resp: clear to auscultation bilaterally and normal percussion bilaterally Cardio: regular rate and rhythm, S1, S2 normal, no murmur, click, rub or gallop GI: soft, non-tender; bowel sounds normal; no masses,  no organomegaly  Lab Results:  Recent Labs  08/30/16 0424 08/31/16 0512  WBC 7.3 6.4  HGB 10.7* 10.3*  HCT 29.9* 29.9*  PLT 255 241   BMET  Recent Labs  08/31/16 0512 09/01/16 0543  NA 134* 134*  K 3.8 3.5  CL 107 106  CO2 22 21*  GLUCOSE 114* 110*  BUN 9 8  CREATININE 0.98 0.89  CALCIUM 7.1* 6.8*    Studies/Results: Dg Chest 2 View  Result Date: 08/31/2016 CLINICAL DATA:  Testicular infection.  Former smoker.  Dyspnea. EXAM: CHEST  2 VIEW COMPARISON:  08/26/2016 FINDINGS: There is bibasilar atelectasis. There is no focal parenchymal opacity. There is no pleural effusion or pneumothorax. The heart and mediastinal contours are unremarkable. There is severe osteoarthritis of the right glenohumeral joint. IMPRESSION: No active cardiopulmonary disease. Electronically Signed   By: Kathreen Devoid   On: 08/31/2016 10:43    Medications: I have reviewed the patient's current medications.  Assessment/Plan: 1. C. difficile diarrhea: continue PO vancomycin slowly improving. Low grade  fever last night. Consider adding IV flagyl.  2. Generalized weakness due to diarrhea: PT recommends home health but wife worried and is requesting STR -cm aware.  3. Hyponatremia:Resolved with IVF.  4.Hypokalemia: Secondary to GI loss. Pharmacy consulted to help keep up with electrolyte replacement.  Time spent 20 min  LOS: 3 days   Baxter Hire 09/01/2016, 9:42 AM

## 2016-09-02 LAB — PHOSPHORUS
PHOSPHORUS: 1.9 mg/dL — AB (ref 2.5–4.6)
Phosphorus: 2.8 mg/dL (ref 2.5–4.6)

## 2016-09-02 LAB — MAGNESIUM: MAGNESIUM: 2.1 mg/dL (ref 1.7–2.4)

## 2016-09-02 LAB — BASIC METABOLIC PANEL
Anion gap: 5 (ref 5–15)
BUN: 7 mg/dL (ref 6–20)
CALCIUM: 6.8 mg/dL — AB (ref 8.9–10.3)
CO2: 22 mmol/L (ref 22–32)
CREATININE: 0.85 mg/dL (ref 0.61–1.24)
Chloride: 106 mmol/L (ref 101–111)
GFR calc non Af Amer: >60 mL/min (ref >60)
Glucose, Bld: 116 mg/dL — ABNORMAL HIGH (ref 65–99)
Potassium: 4.1 mmol/L (ref 3.5–5.1)
SODIUM: 133 mmol/L — AB (ref 135–145)

## 2016-09-02 LAB — POTASSIUM: Potassium: 4.4 mmol/L (ref 3.5–5.1)

## 2016-09-02 MED ORDER — POTASSIUM PHOSPHATES 15 MMOLE/5ML IV SOLN
30.0000 mmol | Freq: Once | INTRAVENOUS | Status: AC
  Administered 2016-09-02: 12:00:00 30 mmol via INTRAVENOUS
  Filled 2016-09-02: qty 10

## 2016-09-02 MED ORDER — METRONIDAZOLE IN NACL 5-0.79 MG/ML-% IV SOLN
500.0000 mg | Freq: Three times a day (TID) | INTRAVENOUS | Status: DC
  Administered 2016-09-02 – 2016-09-04 (×6): 500 mg via INTRAVENOUS
  Filled 2016-09-02 (×8): qty 100

## 2016-09-02 MED ORDER — FLUCONAZOLE 100 MG PO TABS
150.0000 mg | ORAL_TABLET | Freq: Once | ORAL | Status: AC
  Administered 2016-09-02: 12:00:00 150 mg via ORAL
  Filled 2016-09-02: qty 2

## 2016-09-02 NOTE — Progress Notes (Signed)
Subjective: Complains about sore mouth.  Objective: Vital signs in last 24 hours: Temp:  [98.5 F (36.9 C)-100.6 F (38.1 C)] 98.6 F (37 C) (09/24 0353) Pulse Rate:  [82-100] 96 (09/24 0353) Resp:  [28-36] 28 (09/24 0353) BP: (110-145)/(58-81) 140/81 (09/24 0353) SpO2:  [98 %-100 %] 100 % (09/24 0353) Weight change:  Last BM Date: 09/01/16  Intake/Output from previous day: 09/23 0701 - 09/24 0700 In: 1660 [P.O.:240; I.V.:1165; IV Piggyback:255] Out: -  Intake/Output this shift: No intake/output data recorded.  General appearance: no distress Throat: lips, mucosa, and tongue normal; teeth and gums normal and White plaques on oral mucosa and tongue. Resp: clear to auscultation bilaterally and normal percussion bilaterally Cardio: regular rate and rhythm, S1, S2 normal, no murmur, click, rub or gallop GI: soft, non-tender; bowel sounds normal; no masses,  no organomegaly  Lab Results:  Recent Labs  08/31/16 0512  WBC 6.4  HGB 10.3*  HCT 29.9*  PLT 241   BMET  Recent Labs  09/01/16 0543 09/02/16 0540  NA 134* 133*  K 3.5 4.1  CL 106 106  CO2 21* 22  GLUCOSE 110* 116*  BUN 8 7  CREATININE 0.89 0.85  CALCIUM 6.8* 6.8*    Studies/Results: Dg Chest 2 View  Result Date: 08/31/2016 CLINICAL DATA:  Testicular infection.  Former smoker.  Dyspnea. EXAM: CHEST  2 VIEW COMPARISON:  08/26/2016 FINDINGS: There is bibasilar atelectasis. There is no focal parenchymal opacity. There is no pleural effusion or pneumothorax. The heart and mediastinal contours are unremarkable. There is severe osteoarthritis of the right glenohumeral joint. IMPRESSION: No active cardiopulmonary disease. Electronically Signed   By: Kathreen Devoid   On: 08/31/2016 10:43   Dg Chest Port 1 View  Result Date: 09/01/2016 CLINICAL DATA:  Possible food aspiration today. EXAM: PORTABLE CHEST 1 VIEW COMPARISON:  08/31/2016. FINDINGS: The cardiac silhouette remains borderline enlarged. Aortic arch  calcifications. Stable mild prominence of the interstitial markings and mild linear scarring at the left lung base. Right shoulder degenerative changes and probable old, healed proximal right humerus fracture. IMPRESSION: No acute abnormality. Electronically Signed   By: Claudie Revering M.D.   On: 09/01/2016 15:18    Medications: I have reviewed the patient's current medications.  Assessment/Plan: 1. C. difficile diarrhea: continuePO vancomycin slowly improving. Fever again last night. Will add IV flagyl.  2. Oral Thrush: May have contributed to his choking spell yesterday. He says he has had this for long time and uses dexamethasone mouthwash at home to keep it calmed down. Says this is not thrush and that no doctor including at the [facility] have been able to determine what it is. Will give oral diflucan once. Wife is to bring his mouthwash from home to use. Speech also to evaluate.  2. Generalized weakness due to diarrhea: PT recommends home health but wife worried and is requesting STR -cm aware.  3. Hyponatremia:Resolved with IVF.  4.Hypokalemia: Secondary to GI loss. Pharmacy consulted to help keep up with electrolyte replacement.  Time spent= 30 min  LOS: 4 days   Baxter Hire 09/02/2016, 8:11 AM

## 2016-09-02 NOTE — Progress Notes (Signed)
MEDICATION RELATED CONSULT NOTE -follow up  Pharmacy Consult for electrolytes Indication: hypokalemia and hypomagnesemia  No Known Allergies  Patient Measurements: Height: 5\' 8"  (172.7 cm) Weight: 150 lb (68 kg) IBW/kg (Calculated) : 68.4  Vital Signs: Temp: 98.6 F (37 C) (09/24 0353) Temp Source: Oral (09/24 0353) BP: 140/81 (09/24 0353) Pulse Rate: 96 (09/24 0353) Intake/Output from previous day: 09/23 0701 - 09/24 0700 In: 1660 [P.O.:240; I.V.:1165; IV Piggyback:255] Out: -  Intake/Output from this shift: No intake/output data recorded.  Labs:  Recent Labs  08/31/16 0512 09/01/16 0543 09/02/16 0540  WBC 6.4  --   --   HGB 10.3*  --   --   HCT 29.9*  --   --   PLT 241  --   --   CREATININE 0.98 0.89 0.85  MG 2.3 1.9 2.1  PHOS 1.7* 1.8* 1.9*    Lab Results  Component Value Date   K 4.1 09/02/2016   Estimated Creatinine Clearance: 73.3 mL/min (by C-G formula based on SCr of 0.85 mg/dL).   Medical History: Past Medical History:  Diagnosis Date  . Arthritis   . Basal cell carcinoma   . Chickenpox   . Kidney stones    Assessment: 75yo F with Cdiff  9/21: K 3.3; Mg 1.8 9/22: K 3.8; Mg 2.3; Phos 1.7 9/23  K 3.5, Mg 1.9, Phos 1.8 9/24  K 4.1, Mg 2.1, Phos 1.9   Goal of Therapy:  Mg >2.0 and K >2.0 per MD  Plan:  Current orders for:  -KCL 49mEq PO daily   -Potassium/Sodium Phosphates Packet 4x daily  -Magnesium oxide 400mg  BID.   9/23  Will give Magnesium 1 gram IV x1 and KPhos 15 mmol IV x 1. F/u am labs.  9/24 Patient c/o sore mouth, ?oral thrush.  Will d/c oral KPhos packets. Will order Potassium Phosphate 30 mmol IV x1. Will recheck Phos/ K+ at 2000. F/u am labs.   Jasyah Theurer A, PharmD 09/02/2016,8:51 AM

## 2016-09-02 NOTE — Progress Notes (Signed)
MEDICATION RELATED CONSULT NOTE -follow up  Pharmacy Consult for electrolytes Indication: hypokalemia and hypomagnesemia  No Known Allergies  Patient Measurements: Height: 5\' 8"  (172.7 cm) Weight: 150 lb (68 kg) IBW/kg (Calculated) : 68.4  Vital Signs: Temp: 98.6 F (37 C) (09/24 1438) Temp Source: Oral (09/24 1438) BP: 112/62 (09/24 1438) Pulse Rate: 82 (09/24 1438) Intake/Output from previous day: 09/23 0701 - 09/24 0700 In: 1660 [P.O.:240; I.V.:1165; IV Piggyback:255] Out: -  Intake/Output from this shift: No intake/output data recorded.  Labs:  Recent Labs  08/31/16 0512 09/01/16 0543 09/02/16 0540 09/02/16 1942  WBC 6.4  --   --   --   HGB 10.3*  --   --   --   HCT 29.9*  --   --   --   PLT 241  --   --   --   CREATININE 0.98 0.89 0.85  --   MG 2.3 1.9 2.1  --   PHOS 1.7* 1.8* 1.9* 2.8    Lab Results  Component Value Date   K 4.4 09/02/2016   Estimated Creatinine Clearance: 73.3 mL/min (by C-G formula based on SCr of 0.85 mg/dL).   Medical History: Past Medical History:  Diagnosis Date  . Arthritis   . Basal cell carcinoma   . Chickenpox   . Kidney stones    Assessment: 75yo F with Cdiff  9/21: K 3.3; Mg 1.8 9/22: K 3.8; Mg 2.3; Phos 1.7 9/23  K 3.5, Mg 1.9, Phos 1.8 9/24  K 4.1, Mg 2.1, Phos 1.9   Goal of Therapy:  Mg >2.0 and K >2.0 per MD  Plan:  Current orders for:  -KCL 45mEq PO daily   -Potassium/Sodium Phosphates Packet 4x daily  -Magnesium oxide 400mg  BID.   9/23  Will give Magnesium 1 gram IV x1 and KPhos 15 mmol IV x 1. F/u am labs.  9/24 Patient c/o sore mouth, ?oral thrush.  Will d/c oral KPhos packets. Will order Potassium Phosphate 30 mmol IV x1. Will recheck Phos/ K+ at 2000. F/u am labs.  9/24 20:00 K and Phos are within range, will follow up on am labs.  Paulina Fusi, PharmD, BCPS 09/02/2016 8:35 PM

## 2016-09-03 LAB — BASIC METABOLIC PANEL
Anion gap: 5 (ref 5–15)
BUN: 7 mg/dL (ref 6–20)
CALCIUM: 7.2 mg/dL — AB (ref 8.9–10.3)
CO2: 23 mmol/L (ref 22–32)
CREATININE: 0.78 mg/dL (ref 0.61–1.24)
Chloride: 105 mmol/L (ref 101–111)
GFR calc Af Amer: >60 mL/min (ref >60)
GLUCOSE: 149 mg/dL — AB (ref 65–99)
Potassium: 4.8 mmol/L (ref 3.5–5.1)
Sodium: 133 mmol/L — ABNORMAL LOW (ref 135–145)

## 2016-09-03 LAB — PHOSPHORUS: Phosphorus: 2.5 mg/dL (ref 2.5–4.6)

## 2016-09-03 LAB — WBCS, STOOL

## 2016-09-03 LAB — MAGNESIUM: Magnesium: 2.1 mg/dL (ref 1.7–2.4)

## 2016-09-03 NOTE — Progress Notes (Addendum)
Physical Therapy Treatment Patient Details Name: Stephanie Couphilip Abair MRN: [id number] DOB: 1941-03-29 Today's Date: 09/03/2016    History of Present Illness Stephanie Couphilip Ostrand is a 75 y.o. male with a known history of Hypertension, Basal cell carcinoma and arthritis was in a usual state of health until last week when he reports diarrhea. He states he developed watery diarrhea last week but denies any recent hospitalizations or recent antibiotics. He was seen in the emergency department this morning for symptoms of diarrhea, fatigue and weakness, was hydrated and discharged on Cipro for presumed infectious diarrhea.. He returns to Hospital his evening because he states that he was on the toilet he felt dizzy and lightheaded as if he was going to pass out. He fell forward hitting his right forehead on the floor. Denies loss of consciousness. Patient reports tonight he became short of breath after walking into the toilet. He also complained of fatigue, generalized weakness, fever to 102 last night. Otherwise there has been no change in status. Patient has been taking medication as prescribed and there has been no recent change in medication or diet.  There has been no recent illness, travel or sick contacts.  He states that he has not had symptoms like this before. Pt tested positive for C.diff and is admitted for the same as well as generalized weakness and hyponatremia. No additional falls in the last 12 months.     PT Comments    Pt agreeable to PT. Pt denies pain or any other adverse symptoms. Pt continues with Mod I for bed mobility and transfers. Pt with supervision and stand by assist with aid assisting personal hygiene per pt request. Pt ambulates without assistive device subjectively at baseline speed without fatigue, or feeling too exerted post 430 ft ambulation. Pt offered stair climbing; pt denies having steps at home and prefers not to perform steps. Pt received up in chair post session. Continue PT to  progress strength, endurance to improve functional mobility and safety to allow for an optimal return home to prior level of function.   Follow Up Recommendations  Home health PT     Equipment Recommendations       Recommendations for Other Services       Precautions / Restrictions Precautions Precautions: Fall Restrictions Weight Bearing Restrictions: No    Mobility  Bed Mobility Overal bed mobility: Modified Independent             General bed mobility comments: Use of rails; mild increased time  Transfers Overall transfer level: Modified independent Equipment used: None Transfers: Sit to/from Stand Sit to Stand: Modified independent (Device/Increase time)         General transfer comment: Mild increased time to rise from bed/BSC  Ambulation/Gait Ambulation/Gait assistance: Min guard;Supervision Ambulation Distance (Feet): 430 Feet Assistive device: None Gait Pattern/deviations: Step-through pattern;Trunk flexed Gait velocity:  (subjectively at baseline speed)   General Gait Details: Ambulates with shortened stride and quick steps with mild forward flexion and anterior lean, but no LOB   Stairs            Wheelchair Mobility    Modified Rankin (Stroke Patients Only)       Balance Overall balance assessment: Modified Independent                                  Cognition Arousal/Alertness: Awake/alert Behavior During Therapy: WFL for tasks assessed/performed Overall Cognitive Status: Within Functional Limits for  tasks assessed                      Exercises Other Exercises Other Exercises: Supervision toileting and SBA during personal hygiene with aid    General Comments        Pertinent Vitals/Pain Pain Assessment: No/denies pain    Home Living                      Prior Function            PT Goals (current goals can now be found in the care plan section) Progress towards PT goals: Progressing  toward goals    Frequency    Min 2X/week      PT Plan Current plan remains appropriate    Co-evaluation             End of Session Equipment Utilized During Treatment: Gait belt Activity Tolerance: Patient tolerated treatment well Patient left: in chair;with call bell/phone within reach;with chair alarm set;with family/visitor present     Time: [id number] PT Time Calculation (min) (ACUTE ONLY): 24 min  Charges:  $Gait Training: 8-22 mins $Therapeutic Activity: 8-22 mins                    G Codes:      Larae Grooms 2016-09-24, 12:34 PM

## 2016-09-03 NOTE — Progress Notes (Signed)
MEDICATION RELATED CONSULT NOTE -follow up  Pharmacy Consult for electrolytes Indication: hypokalemia and hypomagnesemia  No Known Allergies  Patient Measurements: Height: 5\' 8"  (172.7 cm) Weight: 150 lb (68 kg) IBW/kg (Calculated) : 68.4  Vital Signs: Temp: 98 F (36.7 C) (09/25 0511) Temp Source: Oral (09/25 0511) BP: 124/73 (09/25 0511) Pulse Rate: 77 (09/25 0511) Intake/Output from previous day: 09/24 0701 - 09/25 0700 In: 1920.8 [I.V.:1110.8; IV Piggyback:810] Out: -  Intake/Output from this shift: No intake/output data recorded.  Labs:  Recent Labs  09/01/16 0543 09/02/16 0540 09/02/16 1942 09/03/16 0440  CREATININE 0.89 0.85  --  0.78  MG 1.9 2.1  --  2.1  PHOS 1.8* 1.9* 2.8 2.5    Lab Results  Component Value Date   K 4.8 09/03/2016   Estimated Creatinine Clearance: 77.9 mL/min (by C-G formula based on SCr of 0.78 mg/dL).   Medical History: Past Medical History:  Diagnosis Date  . Arthritis   . Basal cell carcinoma   . Chickenpox   . Kidney stones    Assessment: 75yo F with Cdiff  9/21: K 3.3; Mg 1.8 9/22: K 3.8; Mg 2.3; Phos 1.7 9/23  K 3.5, Mg 1.9, Phos 1.8 9/24  K 4.1, Mg 2.1, Phos 1.9 9/25  K: 4.8 Mg: 2.1  Phos: 2.5   Goal of Therapy:  Mg >2.0 and K >2.0 per MD  Plan:  Electrolytes WNL. No supplementation needed. Will F/U with AM labs.   Nancy Fetter, PharmD Clinical Pharmacist 09/03/2016 11:20 AM

## 2016-09-03 NOTE — Consult Note (Signed)
[facility] Infectious Disease     Reason for Consult: C diff   Referring Physician: Hugelmeyer, A Date of Admission:  08/26/2016   Principal Problem:   C. difficile colitis Active Problems:   Orthostatic hypotension   PVC (premature ventricular contraction)   Diarrhea   Minor head injury   Near syncope   Clostridium difficile colitis   HPI: Henry Mays is a 75 y.o. male admitted with diarrhea and found to have dehydration and C diff colitis. He had fallen from feeling lightheaded.  Since admission has had a few episodes fever and has been on oral vanco and started IV flagyl. Diarrhea slowing but persists.   Past Medical History:  Diagnosis Date  . Arthritis   . Basal cell carcinoma   . Chickenpox   . Kidney stones    Past Surgical History:  Procedure Laterality Date  . BASAL CELL CARCINOMA EXCISION    . HERNIA REPAIR     Social History  Substance Use Topics  . Smoking status: Former Research scientist (life sciences)  . Smokeless tobacco: Never Used  . Alcohol use 6.0 oz/week    10 Standard drinks or equivalent per week   Family History  Problem Relation Age of Onset  . Arthritis    . Stroke    . Hypertension    . Leukemia Father     Allergies: No Known Allergies  Current antibiotics: Antibiotics Given (last 72 hours)    Date/Time Action Medication Dose Rate   08/31/16 1814 Given   vancomycin (VANCOCIN) 50 mg/mL oral solution 125 mg 125 mg    08/31/16 2059 Given   vancomycin (VANCOCIN) 50 mg/mL oral solution 125 mg 125 mg    09/01/16 1035 Given   vancomycin (VANCOCIN) 50 mg/mL oral solution 125 mg 125 mg    09/01/16 1502 Given   vancomycin (VANCOCIN) 50 mg/mL oral solution 125 mg 125 mg    09/01/16 1824 Given   vancomycin (VANCOCIN) 50 mg/mL oral solution 125 mg 125 mg    09/01/16 2029 Given   vancomycin (VANCOCIN) 50 mg/mL oral solution 125 mg 125 mg    09/02/16 1150 Given   metroNIDAZOLE (FLAGYL) IVPB 500 mg 500 mg 100 mL/hr   09/02/16 1152 Given   vancomycin (VANCOCIN)  50 mg/mL oral solution 125 mg 125 mg    09/02/16 1548 Given   vancomycin (VANCOCIN) 50 mg/mL oral solution 125 mg 125 mg    09/02/16 1753 Given   vancomycin (VANCOCIN) 50 mg/mL oral solution 125 mg 125 mg    09/02/16 2127 Given   vancomycin (VANCOCIN) 50 mg/mL oral solution 125 mg 125 mg    09/02/16 2127 Given   metroNIDAZOLE (FLAGYL) IVPB 500 mg 500 mg 100 mL/hr   09/03/16 0516 Given   metroNIDAZOLE (FLAGYL) IVPB 500 mg 500 mg 100 mL/hr   09/03/16 1105 Given   vancomycin (VANCOCIN) 50 mg/mL oral solution 125 mg 125 mg    09/03/16 1209 Given   metroNIDAZOLE (FLAGYL) IVPB 500 mg 500 mg 100 mL/hr   09/03/16 1249 Given   vancomycin (VANCOCIN) 50 mg/mL oral solution 125 mg 125 mg       MEDICATIONS: . enoxaparin (LOVENOX) injection  40 mg Subcutaneous Q24H  . magnesium oxide  400 mg Oral BID  . mouth rinse  15 mL Mouth Rinse BID  . metoprolol tartrate  25 mg Oral BID  . metronidazole  500 mg Intravenous Q8H  . nystatin  5 mL Oral QID  . potassium chloride  40 mEq Oral  Daily  . sodium chloride flush  3 mL Intravenous Q12H  . vancomycin  125 mg Oral QID    Review of Systems - 11 systems reviewed and negative per HPI   OBJECTIVE: Temp:  [97.7 F (36.5 C)-98.4 F (36.9 C)] 97.7 F (36.5 C) (09/25 1255) Pulse Rate:  [75-85] 85 (09/25 1258) Resp:  [20-28] 20 (09/25 1407) BP: (109-124)/(56-73) 109/56 (09/25 1258) SpO2:  [100 %] 100 % (09/25 1258) Physical Exam  Constitutional: He is oriented to person, place, and time. He appears well-developed and well-nourished. No distress.  HENT: anicteric, v shaped sutures on forehead Mouth/Throat: Oropharynx is clear and moist. No oropharyngeal exudate.  Cardiovascular: Normal rate, regular rhythm and normal heart sounds.  Pulmonary/Chest: Effort normal and breath sounds normal. No respiratory distress. He has no wheezes.  Abdominal: Soft. Bowel sounds are normal. He exhibits no distension. There is no tenderness.  He has no cervical  adenopathy.  Neurological: He is alert and oriented to person, place, and time.  Skin: Skin is warm and dry. No rash noted. No erythema.  Psychiatric: He has a normal mood and affect. His behavior is normal.   LABS: Results for orders placed or performed during the hospital encounter of 08/26/16 (from the past 48 hour(s))  Basic metabolic panel     Status: Abnormal   Collection Time: 09/02/16  5:40 AM  Result Value Ref Range   Sodium 133 (L) 135 - 145 mmol/L   Potassium 4.1 3.5 - 5.1 mmol/L   Chloride 106 101 - 111 mmol/L   CO2 22 22 - 32 mmol/L   Glucose, Bld 116 (H) 65 - 99 mg/dL   BUN 7 6 - 20 mg/dL   Creatinine, Ser 0.85 0.61 - 1.24 mg/dL   Calcium 6.8 (L) 8.9 - 10.3 mg/dL   GFR calc non Af Amer >60 >60 mL/min   GFR calc Af Amer >60 >60 mL/min    Comment: (NOTE) The eGFR has been calculated using the CKD EPI equation. This calculation has not been validated in all clinical situations. eGFR's persistently <60 mL/min signify possible Chronic Kidney Disease.    Anion gap 5 5 - 15  Magnesium     Status: None   Collection Time: 09/02/16  5:40 AM  Result Value Ref Range   Magnesium 2.1 1.7 - 2.4 mg/dL  Phosphorus     Status: Abnormal   Collection Time: 09/02/16  5:40 AM  Result Value Ref Range   Phosphorus 1.9 (L) 2.5 - 4.6 mg/dL  Phosphorus     Status: None   Collection Time: 09/02/16  7:42 PM  Result Value Ref Range   Phosphorus 2.8 2.5 - 4.6 mg/dL  Potassium     Status: None   Collection Time: 09/02/16  7:42 PM  Result Value Ref Range   Potassium 4.4 3.5 - 5.1 mmol/L  Basic metabolic panel     Status: Abnormal   Collection Time: 09/03/16  4:40 AM  Result Value Ref Range   Sodium 133 (L) 135 - 145 mmol/L   Potassium 4.8 3.5 - 5.1 mmol/L   Chloride 105 101 - 111 mmol/L   CO2 23 22 - 32 mmol/L   Glucose, Bld 149 (H) 65 - 99 mg/dL   BUN 7 6 - 20 mg/dL   Creatinine, Ser 0.78 0.61 - 1.24 mg/dL   Calcium 7.2 (L) 8.9 - 10.3 mg/dL   GFR calc non Af Amer >60 >60 mL/min    GFR calc Af Amer >60 >60 mL/min  Comment: (NOTE) The eGFR has been calculated using the CKD EPI equation. This calculation has not been validated in all clinical situations. eGFR's persistently <60 mL/min signify possible Chronic Kidney Disease.    Anion gap 5 5 - 15  Phosphorus     Status: None   Collection Time: 09/03/16  4:40 AM  Result Value Ref Range   Phosphorus 2.5 2.5 - 4.6 mg/dL  Magnesium     Status: None   Collection Time: 09/03/16  4:40 AM  Result Value Ref Range   Magnesium 2.1 1.7 - 2.4 mg/dL   No components found for: ESR, C REACTIVE PROTEIN MICRO: Recent Results (from the past 720 hour(s))  Gastrointestinal Panel by PCR , Stool     Status: None   Collection Time: 08/26/16  8:41 PM  Result Value Ref Range Status   Campylobacter species NOT DETECTED NOT DETECTED Final   Plesimonas shigelloides NOT DETECTED NOT DETECTED Final   Salmonella species NOT DETECTED NOT DETECTED Final   Yersinia enterocolitica NOT DETECTED NOT DETECTED Final   Vibrio species NOT DETECTED NOT DETECTED Final   Vibrio cholerae NOT DETECTED NOT DETECTED Final   Enteroaggregative E coli (EAEC) NOT DETECTED NOT DETECTED Final   Enteropathogenic E coli (EPEC) NOT DETECTED NOT DETECTED Final   Enterotoxigenic E coli (ETEC) NOT DETECTED NOT DETECTED Final   Shiga like toxin producing E coli (STEC) NOT DETECTED NOT DETECTED Final   E. coli O157 NOT DETECTED NOT DETECTED Final   Shigella/Enteroinvasive E coli (EIEC) NOT DETECTED NOT DETECTED Final   Cryptosporidium NOT DETECTED NOT DETECTED Final   Cyclospora cayetanensis NOT DETECTED NOT DETECTED Final   Entamoeba histolytica NOT DETECTED NOT DETECTED Final   Giardia lamblia NOT DETECTED NOT DETECTED Final   Adenovirus F40/41 NOT DETECTED NOT DETECTED Final   Astrovirus NOT DETECTED NOT DETECTED Final   Norovirus GI/GII NOT DETECTED NOT DETECTED Final   Rotavirus A NOT DETECTED NOT DETECTED Final   Sapovirus (I, II, IV, and V) NOT DETECTED  NOT DETECTED Final  C difficile quick scan w PCR reflex     Status: Abnormal   Collection Time: 08/26/16  8:41 PM  Result Value Ref Range Status   C Diff antigen POSITIVE (A) NEGATIVE Final   C Diff toxin POSITIVE (A) NEGATIVE Final   C Diff interpretation Toxin producing C. difficile detected.  Final    Comment: CRITICAL RESULT CALLED TO, READ BACK BY AND VERIFIED WITH:  JERRIE THOMPSON AT 2145 08/26/16 SDR   Urine culture     Status: None   Collection Time: 08/26/16  8:41 PM  Result Value Ref Range Status   Specimen Description URINE, CLEAN CATCH  Final   Special Requests Normal  Final   Culture NO GROWTH Performed at [facility]   Final   Report Status 08/28/2016 FINAL  Final  CULTURE, BLOOD (ROUTINE X 2) w Reflex to ID Panel     Status: None (Preliminary result)   Collection Time: 08/30/16 10:22 AM  Result Value Ref Range Status   Specimen Description BLOOD LEFT FA  Final   Special Requests   Final    BOTTLES DRAWN AEROBIC AND ANAEROBIC AER 3ML ANA 2ML   Culture NO GROWTH 3 DAYS  Final   Report Status PENDING  Incomplete  CULTURE, BLOOD (ROUTINE X 2) w Reflex to ID Panel     Status: None (Preliminary result)   Collection Time: 08/30/16 10:22 AM  Result Value Ref Range Status   Specimen  Description BLOOD LEFT AC  Final   Special Requests BOTTLES DRAWN AEROBIC AND ANAEROBIC 2ML  Final   Culture NO GROWTH 3 DAYS  Final   Report Status PENDING  Incomplete  Culture, expectorated sputum-assessment     Status: None   Collection Time: 08/31/16  6:15 PM  Result Value Ref Range Status   Specimen Description EXPECTORATED SPUTUM  Final   Special Requests Normal  Final   Sputum evaluation   Final    Sputum specimen not acceptable for testing.  Please recollect.   SPOKE TO Erlene Quan 08/31/16 @ 2031  [place]    Report Status 08/31/2016 FINAL  Final    IMAGING: Dg Chest 2 View  Result Date: 08/31/2016 CLINICAL DATA:  Testicular infection.  Former smoker.  Dyspnea. EXAM:  CHEST  2 VIEW COMPARISON:  08/26/2016 FINDINGS: There is bibasilar atelectasis. There is no focal parenchymal opacity. There is no pleural effusion or pneumothorax. The heart and mediastinal contours are unremarkable. There is severe osteoarthritis of the right glenohumeral joint. IMPRESSION: No active cardiopulmonary disease. Electronically Signed   By: Kathreen Devoid   On: 08/31/2016 10:43   Dg Chest 2 View  Result Date: 08/26/2016 CLINICAL DATA:  Shortness of breath, onset last night. EXAM: CHEST  2 VIEW COMPARISON:  None. FINDINGS: The lungs are hyperinflated. Left basilar atelectasis or scarring. Heart size is normal. There is atherosclerosis of the thoracic aorta. Ill-defined right infrahilar opacity. No pleural fluid or pneumothorax. Chronic change about the right shoulder. IMPRESSION: Ill-defined right infrahilar opacity, favor atelectasis, less likely pneumonia. Linear atelectasis or scarring at the left lung base. Mild hyperinflation. Thoracic aortic atherosclerosis. Electronically Signed   By: Jeb Levering M.D.   On: 08/26/2016 07:02   Ct Head Wo Contrast  Result Date: 08/26/2016 CLINICAL DATA:  Fall.  Head injury.  Right frontal laceration. EXAM: CT HEAD WITHOUT CONTRAST TECHNIQUE: Contiguous axial images were obtained from the base of the skull through the vertex without intravenous contrast. COMPARISON:  None. FINDINGS: Brain: There is a slightly hyperdense 1.4 cm mass at the left foramen of Monro. No definite ventriculomegaly accounting for generalized cerebral volume loss. Otherwise no evidence of parenchymal hemorrhage or extra-axial fluid collection. Otherwise no mass lesion, mass effect, or midline shift. No CT evidence of acute infarction. Intracranial atherosclerosis. Vascular: No hyperdense vessel or unexpected calcification. Skull: No evidence of calvarial fracture. Sinuses/Orbits: There are mild layering frothy secretions in the left maxillary and left sphenoid sinus with associated  minimal mucoperiosteal thickening. Other: Small right frontal scalp contusion. The mastoid air cells are unopacified. IMPRESSION: 1. Indeterminate slightly hyperdense 1.4 cm mass at the left foramen of Monro. Differential includes a neoplasm such as a subependymoma or colloid cyst. This is not considered to represent hemorrhage. Recommend further evaluation with brain MRI without and with IV contrast when clinically feasible. 2. No definite ventriculomegaly accounting for generalized cerebral volume loss. No midline shift. No acute intracranial hemorrhage. No extra-axial fluid collections. 3. Small right frontal scalp contusion. 4. Mild left paranasal sinusitis, possibly acute. Electronically Signed   By: Ilona Sorrel M.D.   On: 08/26/2016 19:13   Mr Jeri Cos WG Contrast  Result Date: 08/27/2016 CLINICAL DATA:  Abnormal CT of the brain. EXAM: MRI HEAD WITHOUT AND WITH CONTRAST TECHNIQUE: Multiplanar, multiecho pulse sequences of the brain and surrounding structures were obtained without and with intravenous contrast. CONTRAST:  74m MULTIHANCE GADOBENATE DIMEGLUMINE 529 MG/ML IV SOLN COMPARISON:  CT head without contrast 9/17/710 FINDINGS: Brain: A well-defined  nonenhancing T1 hypodense lesion is present at the foramen of Monro. The lesion measures 10 x 15 x 11 mm. There is no associated hydrocephalus. Mild generalized atrophy is within normal limits for age. There is no significant white matter disease. The ventricles are proportionate to the degree of atrophy. The internal auditory canals are within normal limits bilaterally. The brainstem and cerebellum are normal. A developmental venous anomaly is present in the left cerebellum. No pathologic enhancement is present otherwise. Vascular: Flow is present in the major intracranial arteries. Skull and upper cervical spine: The skullbase is unremarkable. Midline sagittal images demonstrate a 10 mm benign appearing nonenhancing pineal cyst. The upper cervical spine  is within normal limits. Sinuses/Orbits: The a small fluid levels present in the left maxillary sinus. Mucosal thickening is present along the floor of the left maxillary sinus. There is minimal mucosal thickening with an anterior left ethmoid air cells as well. The left sphenoid sinus is opacified. No significant right-sided sinus disease is present. The mastoid air cells are clear bilaterally. Other: IMPRESSION: 1. 10 x 15 x 11 mm nonenhancing lesion at the foramen of Monro extending into the left lateral ventricle is compatible with a colloid cyst. 2. No associated hydrocephalus. 3. Mild atrophy is likely within normal limits for age without significant white matter disease. 4. Asymmetric left-sided sinus disease as described. 5. Benign appearing pineal cyst.  No follow-up is necessary. Electronically Signed   By: San Morelle M.D.   On: 08/27/2016 12:06   Dg Chest Port 1 View  Result Date: 09/01/2016 CLINICAL DATA:  Possible food aspiration today. EXAM: PORTABLE CHEST 1 VIEW COMPARISON:  08/31/2016. FINDINGS: The cardiac silhouette remains borderline enlarged. Aortic arch calcifications. Stable mild prominence of the interstitial markings and mild linear scarring at the left lung base. Right shoulder degenerative changes and probable old, healed proximal right humerus fracture. IMPRESSION: No acute abnormality. Electronically Signed   By: Claudie Revering M.D.   On: 09/01/2016 15:18    Assessment:   Henry Mays is a 75 y.o. male with community acquired C diff, clinicially improving.  Interestingly no recent prior abx, or hospitalizations although does live at [place] [facility] [place] in communal setting.   Recommendations Vanco 125 mg QID oral for 14 days total Can dc flagyl when ready for DC  Discussed risk of recurrence and advised him to call my office to be seen if it recurs.   Thank you very much for allowing me to participate in the care of this patient. Please call with questions.    Cheral Marker. Ola Spurr, MD

## 2016-09-03 NOTE — Progress Notes (Signed)
Patient ID: Henry Mays, male   DOB: [birth year], 75 y.o.   MRN: [id number]   [place] [facility] [place] NAME: Alcario Andress    MR#:  [id number]  DATE OF BIRTH:  February 15, 1941  SUBJECTIVE:  CHIEF COMPLAINT:   Chief Complaint  Patient presents with  . Fall  Patient seen and examined at bedside. States that his diarrhea is improving. Appetite is still poor however he has been taking solid food. Patient has been doing about the room without difficulty.  REVIEW OF SYSTEMS:  ROS CONSTITUTIONAL: Positive fever/chills, fatigue, weakness, negative weight gain/loss, headache EYES: No blurry or double vision. ENT: No tinnitus, postnasal drip, redness or soreness of the oropharynx. RESPIRATORY: No cough, wheeze, hemoptysis, positive dyspnea. CARDIOVASCULAR: No chest pain, orthopnea, palpitations, syncope. GASTROINTESTINAL: No nausea, vomiting, constipation, abdominal pain, hematemesis, melena or hematochezia. Positive diarrhea GENITOURINARY: No dysuria or hematuria. ENDOCRINE: No polyuria or nocturia. No heat or cold intolerance. HEMATOLOGY: No anemia, bruising, bleeding. INTEGUMENTARY: No rashes, ulcers, lesions. MUSCULOSKELETAL: No arthritis, swelling, gout. NEUROLOGIC: No numbness, tingling, weakness or ataxia. No seizure-type activity. PSYCHIATRIC: No anxiety, depression, insomnia.  DRUG ALLERGIES:  No Known Allergies VITALS:  Blood pressure 124/73, pulse 77, temperature 98 F (36.7 C), temperature source Oral, resp. rate (!) 28, height 5\' 8"  (1.727 m), weight 68 kg (150 lb), SpO2 100 %. PHYSICAL EXAMINATION:  Physical Exam LABORATORY PANEL:   CBC  Recent Labs Lab 08/31/16 0512  WBC 6.4  HGB 10.3*  HCT 29.9*  PLT 241   ------------------------------------------------------------------------------------------------------------------ Chemistries   Recent Labs Lab 09/03/16 0440  NA 133*  K 4.8  CL 105  CO2 23  GLUCOSE 149*  BUN 7   CREATININE 0.78  CALCIUM 7.2*  MG 2.1   RADIOLOGY:  No results found. ASSESSMENT AND PLAN:   Assessment/Plan: 1. C. difficile diarrhea: Improving. Continue by mouth vancomycin and IV Flagyl.  2. Oral Thrush: May have contributed to his choking spell . Continue dexamethasone mouthwash. Await speech and swallowing evaluation.  3. Generalized weakness due to diarrhea: PT recommends home health but wife worried and is requesting STR -cm aware.   3. Hyponatremia: Stable, continue IV normal saline  4.Hypokalemia: Secondary to GI loss. Improved Pharmacy on board.   All the records are reviewed and case discussed with Care Management/Social Worker. Management plans discussed with the patient, family and they are in agreement.  CODE STATUS: Full  TOTAL TIME TAKING CARE OF THIS PATIENT: 30 minutes.   More than 50% of the time was spent in counseling/coordination of care: YES  POSSIBLE D/C IN 1 DAYS, DEPENDING ON CLINICAL CONDITION.   Harvie Bridge M.D on 09/03/2016 at 11:39 AM  Between 7am to 6pm - Pager - [phone]  After 6pm go to [email or web address] - Proofreader  [facility]  Office  [phone]  CC: Primary care physician; Tommi Rumps, MD  Note: This dictation was prepared with Dragon dictation along with smaller phrase technology. Any transcriptional errors that result from this process are unintentional.

## 2016-09-03 NOTE — Care Management Important Message (Signed)
Important Message  Patient Details  Name: Borna Farha MRN: [id number] Date of Birth: Feb 05, 1941   Medicare Important Message Given:  Yes    Shelbie Ammons, RN 09/03/2016, 11:53 AM

## 2016-09-03 NOTE — Progress Notes (Signed)
Initial Nutrition Assessment  DOCUMENTATION CODES:   Not applicable  INTERVENTION:  -Cater to pt preferences, encourage good sources of protein and probiotics.   -If unable to meet nutritional needs, recommend Ensure Enlive po BID, each supplement provides 350 kcal and 20 grams of protein    NUTRITION DIAGNOSIS:   Inadequate oral intake related to altered GI function as evidenced by per patient/family report.    GOAL:   Patient will meet greater than or equal to 90% of their needs    MONITOR:   PO intake, Weight trends  REASON FOR ASSESSMENT:   Malnutrition Screening Tool    ASSESSMENT:      75 y.o. Male admitted with c-diff, oral thrush, weakness and fall. Pt with history of basal cell carcinoma and arthritis  Pt reports decreased intake over the last 2 weeks secondary to diarrhea and not feeling good.  Reports eating bites of meals at times. Reports wife brought pt pimento cheese sandwich for lunch today and ate 100% of it.  Feels that appetite is getting better.  Medications reviewed: nystatin, NS at 26ml/hr Labs reviewed: Na 133, glucose 149, Mag and Phos WNL  Noted wt change from 8/29 at office visit wt of 152 lb 6.4 oz, wt on admission 150 lb 1% wt loss noted   Nutrition-Focused physical exam completed. Findings are mild  fat depletion, mild muscle depletion, and none edema.    Diet Order:  DIET SOFT Room service appropriate? Yes; Fluid consistency: Thin  Skin:  Reviewed, no issues  Last BM:  9/25  Height:   Ht Readings from Last 1 Encounters:  08/26/16 5\' 8"  (1.727 m)    Weight:   Wt Readings from Last 1 Encounters:  08/26/16 150 lb (68 kg)    Ideal Body Weight:     BMI:  Body mass index is 22.81 kg/m.  Estimated Nutritional Needs:   Kcal:  [id number] kcals/d  Protein:  82-102 g/d  Fluid:  >/= 1793ml/d  EDUCATION NEEDS:   No education needs identified at this time  Cachet Mccutchen B. Zenia Resides, [place], [place] (pager) Weekend/On-Call  pager [phone])

## 2016-09-03 NOTE — Evaluation (Signed)
Clinical/Bedside Swallow Evaluation Patient Details  Name: Henry Mays MRN: [id number] Date of Birth: [birth year]  Today's Date: 09/03/2016 Time: SLP Start Time (ACUTE ONLY): 1000 SLP Stop Time (ACUTE ONLY): 1100 SLP Time Calculation (min) (ACUTE ONLY): 60 min  Past Medical History:  Past Medical History:  Diagnosis Date  . Arthritis   . Basal cell carcinoma   . Chickenpox   . Kidney stones    Past Surgical History:  Past Surgical History:  Procedure Laterality Date  . BASAL CELL CARCINOMA EXCISION    . HERNIA REPAIR     HPI:  Henry Mays is a 75 y.o. male with a known history of Hypertension, Basal cell carcinoma and arthritis was in a usual state of health until last week when he reports diarrhea. He states he developed watery diarrhea last week but denies any recent hospitalizations or recent antibiotics. He was seen in the emergency department this morning for symptoms of diarrhea, fatigue and weakness, was hydrated and discharged on Cipro for presumed infectious diarrhea.. He returns to Hospital his evening because he states that he was on the toilet he felt dizzy and lightheaded as if he was going to pass out. He fell forward hitting his right forehead on the floor. Denies loss of consciousness. Patient reports tonight he became short of breath after walking into the toilet. He also complained of fatigue, generalized weakness, fever to 102 last night. Otherwise there has been no change in status. Patient has been taking medication as prescribed and there has been no recent change in medication or diet.  There has been no recent illness, travel or sick contacts.  He states that he has not had symptoms like this before. Pt tested positive for C.diff and is admitted for the same as well as generalized weakness and hyponatremia. No additional falls in the last 12 months. When asked about the "choking" episode which occured yesterday, 9/24, Pt states he does not believe there was any  connection to eating. Pt states there was no food in his mouth and he was not "choking", he just couldn't breathe with a tight frozen feeling in his chest. Pt also reports larger pills are difficult for him to swallow due to throat soreness, Nursing has been breaking and/or crushing larger pills and giving them in applesauce with recent success.  Pt reports chronic history of moderate-severe mouth ulcers and thrush for which he recieves several medications for treatment over the last 40 years. The episodes fluctuate and are sometimes worse then others and it is difficult for him to eat and drink during flare ups.    Assessment / Plan / Recommendation Clinical Impression  Pt appeared to adequately tolerate PO trials of thin liquids, puree and solids with no immediate overt s/s of aspiration. Pt appears at reduced risk for aspiration at this time, when following aspiration precautions. Pt dry swallowed with minimally increased frequency and intermitent throat clearing prior to and during PO trails. No difference in presentation was noted due to PO trials given. Unsure if throat clearing was connected to the action of swallowing PO trials, as the frequency or intensity did not change as PO trials were given. Vocal quality fluctuated between normal and min wet, unsure if due to results of Thrush. Per Pt note he has a chonic case of thrush, specifically "mouth ulcers", which has been occuring with fluctuating intensity for 40+ years.  ST services reccomends a Regular diet with Thin liquids at this time, following aspiration precautions and regular oral care.  Pt was educated on aspiration precautions and demonstrated use of precautions while taking PO trials. Education also given on cleaning straws, cups, and toothbrushes due to his dx of Thrush. Nursing was consulted and aspiration precautions reviewed. ST services to be available for any further education.     Aspiration Risk   (Reduced Risk)    Diet  Recommendation Regular;Thin liquid, Aspiration Precautions  Liquid Administration via: Cup;Straw Medication Administration: Whole meds with puree (Large pills broken or crushed as crushable) Supervision: Patient able to self feed Compensations: Slow rate;Minimize environmental distractions;Small sips/bites;Follow solids with liquid Postural Changes: Seated upright at 90 degrees;Remain upright for at least 30 minutes after po intake    Other  Recommendations Recommended Consults:  (Deitician consult) Oral Care Recommendations: Oral care BID;Staff/trained caregiver to provide oral care   Follow up Recommendations None      Frequency and Duration            Prognosis Prognosis for Safe Diet Advancement: Good      Swallow Study   General Date of Onset: 08/26/16 HPI: Henry Mays is a 75 y.o. male with a known history of Hypertension, Basal cell carcinoma and arthritis was in a usual state of health until last week when he reports diarrhea. He states he developed watery diarrhea last week but denies any recent hospitalizations or recent antibiotics. He was seen in the emergency department this morning for symptoms of diarrhea, fatigue and weakness, was hydrated and discharged on Cipro for presumed infectious diarrhea.. He returns to Hospital his evening because he states that he was on the toilet he felt dizzy and lightheaded as if he was going to pass out. He fell forward hitting his right forehead on the floor. Denies loss of consciousness. Patient reports tonight he became short of breath after walking into the toilet. He also complained of fatigue, generalized weakness, fever to 102 last night. Otherwise there has been no change in status. Patient has been taking medication as prescribed and there has been no recent change in medication or diet.  There has been no recent illness, travel or sick contacts.  He states that he has not had symptoms like this before. Pt tested positive for C.diff  and is admitted for the same as well as generalized weakness and hyponatremia. No additional falls in the last 12 months. When asked about the "choking" episode which occured yesterday, 9/24, Pt states he does not believe there was any connection to eating. Pt states there was no food in his mouth and he was not "choking", he just couldn't breathe with a tight frozen feeling in his chest. Pt also reports larger pills are difficult for him to swallow due to throat soreness, Nursing has been breaking and/or crushing larger pills and giving them in applesauce with recent success.  Pt reports chronic history of moderate-severe mouth ulcers and thrush for which he recieves several medications for treatment over the last 40 years. The episodes fluctuate and are sometimes worse then others and it is difficult for him to eat and drink during flare ups.  Type of Study: Bedside Swallow Evaluation Previous Swallow Assessment: None indicated Diet Prior to this Study: Regular;Thin liquids Temperature Spikes Noted: No (Clear chest xrays in chart) Respiratory Status: Room air History of Recent Intubation: No Behavior/Cognition: Alert;Cooperative;Pleasant mood Oral Cavity Assessment: Within Functional Limits Oral Care Completed by SLP: Recent completion by staff Oral Cavity - Dentition: Adequate natural dentition Vision: Functional for self-feeding Self-Feeding Abilities: Able to feed self;Needs set up  Patient Positioning: Upright in bed Baseline Vocal Quality: Normal;Wet Volitional Cough: Strong Volitional Swallow: Able to elicit    Oral/Motor/Sensory Function Overall Oral Motor/Sensory Function: Within functional limits   Ice Chips Ice chips: Not tested   Thin Liquid Thin Liquid: Within functional limits Presentation: Straw (3 Trials)    Nectar Thick Nectar Thick Liquid: Not tested   Honey Thick Honey Thick Liquid: Not tested   Puree Puree: Within functional limits Presentation: Spoon;Self Fed (5 trials)    Solid   GO   Solid: Within functional limits Presentation: Self Fed;Spoon (4 trials - softend graham cracker)        Rivka Safer, B.A. Clinical Graduate Student 09/03/2016,12:35 PM  This information has been reviewed and agreed upon by this supervising clinician.  Orinda Kenner, [place], CCC-SLP

## 2016-09-04 ENCOUNTER — Telehealth: Payer: Self-pay | Admitting: *Deleted

## 2016-09-04 ENCOUNTER — Telehealth: Payer: Self-pay

## 2016-09-04 LAB — BASIC METABOLIC PANEL
Anion gap: 4 — ABNORMAL LOW (ref 5–15)
BUN: 10 mg/dL (ref 6–20)
CHLORIDE: 109 mmol/L (ref 101–111)
CO2: 23 mmol/L (ref 22–32)
CREATININE: 0.75 mg/dL (ref 0.61–1.24)
Calcium: 7.3 mg/dL — ABNORMAL LOW (ref 8.9–10.3)
GFR calc Af Amer: >60 mL/min (ref >60)
GFR calc non Af Amer: >60 mL/min (ref >60)
GLUCOSE: 137 mg/dL — AB (ref 65–99)
POTASSIUM: 5 mmol/L (ref 3.5–5.1)
Sodium: 136 mmol/L (ref 135–145)

## 2016-09-04 LAB — CULTURE, BLOOD (ROUTINE X 2)
CULTURE: NO GROWTH
Culture: NO GROWTH

## 2016-09-04 LAB — CBC
HEMATOCRIT: 29.8 % — AB (ref 40.0–52.0)
Hemoglobin: 10.2 g/dL — ABNORMAL LOW (ref 13.0–18.0)
MCH: 31.7 pg (ref 26.0–34.0)
MCHC: 34.2 g/dL (ref 32.0–36.0)
MCV: 92.7 fL (ref 80.0–100.0)
Platelets: 306 10*3/uL (ref 150–440)
RBC: 3.21 MIL/uL — ABNORMAL LOW (ref 4.40–5.90)
RDW: 14.2 % (ref 11.5–14.5)
WBC: 6.3 10*3/uL (ref 3.8–10.6)

## 2016-09-04 MED ORDER — VANCOMYCIN 50 MG/ML ORAL SOLUTION: 125.0000 mg | Freq: Four times a day (QID) | ORAL | 0 refills | Status: AC

## 2016-09-04 MED ORDER — VANCOMYCIN 50 MG/ML ORAL SOLUTION: 125.0000 mg | Freq: Four times a day (QID) | ORAL | 0 refills | Status: DC

## 2016-09-04 NOTE — Telephone Encounter (Signed)
Transition Care Management Follow-up Telephone Call   Date discharged? 09/04/16   How have you been since you were released from the hospital? NO FALLS. NO DIZZINESS. DIARRHEA HAS SLOWED.  STITCHES INTACT. NO FEVER. NO SOB. SOME FATIGUE.  NO HEADACHE.  Do you understand why you were in the hospital? YES, FALL. DIZZINESS.    Do you understand the discharge instructions? YES, FOLLOW UP WITH HHPT, INCREASE FLUID INTAKE.  INCREASE ACTIVITY AS TOLERATED.   Where were you discharged to? Home.    Items Reviewed:  Medications reviewed: TAKING ALL SCHEDULED MEDICATIONS AS DIRECTED.  NOT TAKING LISINOPRIL.  Allergies reviewed: YES, NO KNOWN ALLERGIES.  Dietary changes reviewed: YES, HEART HEALTHY DIET.  Referrals reviewed: YES, HHPT, PCP, INFECTIOUS DISEASE.   Functional Questionnaire:   Activities of Daily Living (ADLs):   He states they are independent in the following: Independent in all ADLs.    States they require assistance with the following: Does not require assistance at this time.     Any transportation issues/concerns?: NO.   Any patient concerns? NOT AT THIS TIME.   Confirmed importance and date/time of follow-up visits scheduled YES, appointment scheduled 09/07/16 at 11:30.  Provider Appointment booked with Dr. Caryl Bis (PCP).  Confirmed with patient if condition begins to worsen call PCP or go to the ER.  Patient was given the office number and encouraged to call back with question or concerns.  : YES, PATIENT VERBALIZED UNDERSTANDING.

## 2016-09-04 NOTE — Telephone Encounter (Signed)
Will follow as appropriate. 

## 2016-09-04 NOTE — Progress Notes (Signed)
Physical Therapy Treatment Patient Details Name: Henry Mays MRN: [id number] DOB: 1941-10-31 Today's Date: 09/04/2016    History of Present Illness Henry Mays is a 75 y.o. male with a known history of Hypertension, Basal cell carcinoma and arthritis was in a usual state of health until last week when he reports diarrhea. He states he developed watery diarrhea last week but denies any recent hospitalizations or recent antibiotics. He was seen in the emergency department this morning for symptoms of diarrhea, fatigue and weakness, was hydrated and discharged on Cipro for presumed infectious diarrhea.. He returns to Hospital his evening because he states that he was on the toilet he felt dizzy and lightheaded as if he was going to pass out. He fell forward hitting his right forehead on the floor. Denies loss of consciousness. Patient reports tonight he became short of breath after walking into the toilet. He also complained of fatigue, generalized weakness, fever to 102 last night. Otherwise there has been no change in status. Patient has been taking medication as prescribed and there has been no recent change in medication or diet.  There has been no recent illness, travel or sick contacts.  He states that he has not had symptoms like this before. Pt tested positive for C.diff and is admitted for the same as well as generalized weakness and hyponatremia. No additional falls in the last 12 months.     PT Comments    Pt doing well; eager to go home. Agreeable to PT. Pt notes ambulation feels at baseline speed and quality; pt notes he has an old cane at home, which he does not use, nor is it adjustable. Do not feel pt requires and assistive device at this time. Pt has good awareness and will use and/or purchase adjustable cane if he feels he would be in need. Pt ambulates with supervision (assist for IV pole only) > 500 feet; does not wish stair climbing, as he has no steps in the home or to enter/exit  ("the reason I bought the house"). Pt educated/performs standing lower extremity/core exercises with 1 hand support for strength and balance. At the time of documentation, pt has a current discharge order home with home health.   Follow Up Recommendations  Home health PT     Equipment Recommendations  None recommended by PT    Recommendations for Other Services       Precautions / Restrictions Precautions Precautions: Fall Restrictions Weight Bearing Restrictions: No    Mobility  Bed Mobility Overal bed mobility: Modified Independent             General bed mobility comments: Use of rail  Transfers Overall transfer level: Modified independent                  Ambulation/Gait Ambulation/Gait assistance: Supervision Ambulation Distance (Feet): 500 Feet Assistive device: None Gait Pattern/deviations: WFL(Within Functional Limits)         Stairs            Wheelchair Mobility    Modified Rankin (Stroke Patients Only)       Balance Overall balance assessment: Modified Independent                                  Cognition                            Exercises Other Exercises  Other Exercises: Performance and education on stand exercises for core, LE strengthening and balance    General Comments        Pertinent Vitals/Pain Pain Assessment: No/denies pain    Home Living                      Prior Function            PT Goals (current goals can now be found in the care plan section) Progress towards PT goals: Goals met/education completed, patient discharged from PT    Frequency    Min 2X/week      PT Plan Current plan remains appropriate    Co-evaluation             End of Session   Activity Tolerance: Patient tolerated treatment well Patient left: in bed;with call bell/phone within reach (refused alarm; will call to get up)     Time: 1191-4782 PT Time Calculation (min) (ACUTE  ONLY): 36 min  Charges:  $Gait Training: 8-22 mins $Therapeutic Exercise: 8-22 mins                    G Codes:      Larae Grooms, PTA 09/04/2016, 10:45 AM

## 2016-09-04 NOTE — Progress Notes (Signed)
Speech Therapy Note  Reviewed chart notes, consulted Pt regarding toleration of his diet / meals. Pt denied any swallowing issues, indicated he was doing well with his swallowing and did not need ST services at this time. Nursing / CM informed, continue to recommend education on oral care for Thrush (toothbrush, straw, personal drink cups - cleaning well with warm soapy water between uses). ST will sign off at this time. Nursing to re-consult if any change of status.   Orinda Kenner, [place], CCC-SLP

## 2016-09-04 NOTE — Care Management (Signed)
Discharge to home today per Hugelmeyer DO. Will be followed by [facility] in the home for physical therapy.  Wife will transport. Shelbie Ammons RN MSN CCM Care Management [phone]

## 2016-09-04 NOTE — Progress Notes (Signed)
Pt and wife given d/c instructions r/t medications, completing antibiotic therapy, follow up care, activity, diet, when to call MD, and prevention of spread of CDIFF infection, voiced understanding, prescription x 1 given to pt. Pt d/c home via wheelchair escorted by auxillary and wife.

## 2016-09-04 NOTE — Telephone Encounter (Signed)
Patient will discharge from [facility] on 09/26 Pt contact [phone]

## 2016-09-04 NOTE — Discharge Instructions (Signed)
FAQs  What is Clostridium difficile infection?   Clostridium difficile [pronounced Klo-STRID-ee-um dif-uh-SEEL], also known as "C. diff" [See-dif], is a germ that can cause diarrhea. Most cases of C. diff infection occur in patients taking antibiotics. The most common symptoms of a C. diff infection include:  · Watery diarrhea  · Fever  · Loss of appetite  · Nausea  · Belly pain and tenderness  Who is most likely to get C. diff infection?  The elderly and people with certain medical problems have the greatest chance of getting C. diff. C. diff spores can live outside the human body for a very long time and may be found on things in the environment such as bed linens, bed rails, bathroom fixtures, and medical equipment. C. diff infection can spread from person-to-person on contaminated equipment and on the hands of doctors, nurses, other healthcare providers and visitors.  Can C. diff infection be treated?  Yes, there are antibiotics that can be used to treat C. diff. In some severe cases, a person might have to have surgery to remove the infected part of the intestines. This surgery is needed in only 1 or 2 out of every 100 persons with C. diff.  What are some of the things that hospitals are doing to prevent C. diff infections?  To prevent C. diff infections, doctors, nurses, and other healthcare providers:  · Clean their hands with soap and water or an alcohol-based hand rub before and after caring for every patient. This can prevent C. diff and other germs from being passed from one patient to another on their hands.  · Carefully clean hospital rooms and medical equipment that have been used for patients with C. diff.  · Use Contact Precautions to prevent C. diff from spreading to other patients. Contact Precautions mean:    Whenever possible, patients with C. diff will have a single room or share a room only with someone else who also has C. diff.    Healthcare providers will put on gloves and wear a gown over  their clothing while taking care of patients with C. diff.    Visitors may also be asked to wear a gown and gloves.    When leaving the room, hospital providers and visitors remove their gown and gloves and clean their hands.    Patients on Contact Precautions are asked to stay in their hospital rooms as much as possible. They should not go to common areas, such as the gift shop or cafeteria. They can go to other areas of the hospital for treatments and tests.  · Only give patients antibiotics when it is necessary.  What can I do to help prevent C. diff infections?  · Make sure that all doctors, nurses, and other healthcare providers clean their hands with soap and water or an alcohol-based hand rub before and after caring for you.    If you do not see your providers clean their hands, please ask them to do so.  · Only take antibiotics as prescribed by your doctor.  · Be sure to clean your own hands often, especially after using the bathroom and before eating.  Can my friends and family get C. diff when they visit me?  C. diff infection usually does not occur in persons who are not taking antibiotics. Visitors are not likely to get C. diff. Still, to make it safer for visitors, they should:  · Clean their hands before they enter your room and as they leave   your room  · Ask the nurse if they need to wear protective gowns and gloves when they visit you.  What do I need to do when I go home from the hospital?  Once you are back at home, you can return to your normal routine. Often, the diarrhea will be better or completely gone before you go home. This makes giving C. diff to other people much less likely. There are a few things you should do, however, to lower the chances of developing C. diff infection again or of spreading it to others.  · If you are given a prescription to treat C. diff, take the medicine exactly as prescribed by your doctor and pharmacist. Do not take half-doses or stop before you run out.  · Wash  your hands often, especially after going to the bathroom and before preparing food.  · People who live with you should wash their hands often as well.  · If you develop more diarrhea after you get home, tell your doctor immediately.  · Your doctor may give you additional instructions.  If you have questions, please ask your doctor or nurse.  Developed and co-sponsored by The Society for Healthcare Epidemiology of America (SHEA); Infectious Diseases Society of America (IDSA); American Hospital Association; Association for Professionals in Infection Control and Epidemiology (APIC); Centers for Disease Control and Prevention (CDC); and The Joint Commission.     This information is not intended to replace advice given to you by your health care provider. Make sure you discuss any questions you have with your health care provider.     Document Released: 12/01/2013 Document Revised: 04/12/2015 Document Reviewed: 02/09/2015  Elsevier Interactive Patient Education ©2016 Elsevier Inc.

## 2016-09-04 NOTE — Progress Notes (Addendum)
MEDICATION RELATED CONSULT NOTE -follow up  Pharmacy Consult for electrolytes Indication: hypokalemia and hypomagnesemia  No Known Allergies  Patient Measurements: Height: 5\' 8"  (172.7 cm) Weight: 150 lb (68 kg) IBW/kg (Calculated) : 68.4  Vital Signs: Temp: 97.7 F (36.5 C) (09/26 0506) Temp Source: Oral (09/26 0506) BP: 138/75 (09/26 0506) Pulse Rate: 75 (09/26 0506) Intake/Output from previous day: 09/25 0701 - 09/26 0700 In: 1224.2 [I.V.:924.2; IV Piggyback:300] Out: -  Intake/Output from this shift: No intake/output data recorded.  Labs:  Recent Labs  09/02/16 0540 09/02/16 1942 09/03/16 0440 09/04/16 0455  WBC  --   --   --  6.3  HGB  --   --   --  10.2*  HCT  --   --   --  29.8*  PLT  --   --   --  306  CREATININE 0.85  --  0.78 0.75  MG 2.1  --  2.1  --   PHOS 1.9* 2.8 2.5  --     Lab Results  Component Value Date   K 5.0 09/04/2016   Estimated Creatinine Clearance: 77.9 mL/min (by C-G formula based on SCr of 0.75 mg/dL).   Medical History: Past Medical History:  Diagnosis Date  . Arthritis   . Basal cell carcinoma   . Chickenpox   . Kidney stones    Assessment: 74yo F with Cdiff  9/21: K 3.3; Mg 1.8 9/22: K 3.8; Mg 2.3; Phos 1.7 9/23  K 3.5, Mg 1.9, Phos 1.8 9/24  K 4.1, Mg 2.1, Phos 1.9 9/25  K: 4.8 Mg: 2.1  Phos: 2.5 9/26  KL 5.0    Goal of Therapy:  Mg >2.0 and K >2.0 per MD  Plan:  Electrolytes WNL. No supplementation needed. Patient on KCL 67mEq PO daily will D/C KCL as patients potassium is trending up. Will F/U with AM labs.   Loree Fee, PharmD 09/04/2016 7:54 AM

## 2016-09-05 DIAGNOSIS — R531 Weakness: Secondary | ICD-10-CM | POA: Diagnosis not present

## 2016-09-05 DIAGNOSIS — R2689 Other abnormalities of gait and mobility: Secondary | ICD-10-CM | POA: Diagnosis not present

## 2016-09-05 DIAGNOSIS — A047 Enterocolitis due to Clostridium difficile: Secondary | ICD-10-CM | POA: Diagnosis not present

## 2016-09-07 ENCOUNTER — Ambulatory Visit (INDEPENDENT_AMBULATORY_CARE_PROVIDER_SITE_OTHER): Payer: Medicare Other | Admitting: Family Medicine

## 2016-09-07 ENCOUNTER — Encounter: Payer: Self-pay | Admitting: Family Medicine

## 2016-09-07 VITALS — BP 122/70 | HR 82 | Temp 98.5°F | Resp 18 | Wt 151.5 lb

## 2016-09-07 DIAGNOSIS — A0472 Enterocolitis due to Clostridium difficile, not specified as recurrent: Secondary | ICD-10-CM

## 2016-09-07 DIAGNOSIS — R2689 Other abnormalities of gait and mobility: Secondary | ICD-10-CM | POA: Diagnosis not present

## 2016-09-07 DIAGNOSIS — A047 Enterocolitis due to Clostridium difficile: Secondary | ICD-10-CM

## 2016-09-07 DIAGNOSIS — S0191XA Laceration without foreign body of unspecified part of head, initial encounter: Secondary | ICD-10-CM

## 2016-09-07 DIAGNOSIS — E871 Hypo-osmolality and hyponatremia: Secondary | ICD-10-CM | POA: Diagnosis not present

## 2016-09-07 DIAGNOSIS — Q046 Congenital cerebral cysts: Secondary | ICD-10-CM

## 2016-09-07 DIAGNOSIS — R531 Weakness: Secondary | ICD-10-CM | POA: Diagnosis not present

## 2016-09-07 LAB — BASIC METABOLIC PANEL
BUN: 10 mg/dL (ref 6–23)
CO2: 31 meq/L (ref 19–32)
Calcium: 8.7 mg/dL (ref 8.4–10.5)
Chloride: 98 mEq/L (ref 96–112)
Creatinine, Ser: 0.96 mg/dL (ref 0.40–1.50)
GFR: 81.18 mL/min (ref >60.00)
GLUCOSE: 116 mg/dL — AB (ref 70–99)
POTASSIUM: 4.6 meq/L (ref 3.5–5.1)
SODIUM: 134 meq/L — AB (ref 135–145)

## 2016-09-07 NOTE — Patient Instructions (Signed)
Nice to see you. I am glad you are doing better. Please continue the vancomycin. Please stay well hydrated. Please work with physical therapy. Please hold off on driving until you've gotten some of your strength back. We will check some lab work today and call you with the results.

## 2016-09-07 NOTE — Progress Notes (Signed)
Pre visit review using our clinic review tool, if applicable. No additional management support is needed unless otherwise documented below in the visit note. 

## 2016-09-07 NOTE — Assessment & Plan Note (Signed)
Patient's prior diarrhea related to C. difficile infection. Overall improving. No diarrhea at this time. On vancomycin. Appears to be doing well although appears to be somewhat weak after 9 days in the hospital. We'll continue vancomycin. He will have physical therapy come out to work with him at home. We will check a BMP today. He'll continue to monitor for recurrence.

## 2016-09-07 NOTE — Progress Notes (Signed)
  Tommi Rumps, MD Phone: [phone]  Henry Mays is a 75 y.o. male who presents today for hospital follow-up.  Patient recently discharged from hospital after being admitted for dehydration, head injury, and C. difficile infection. There is no discharge summary completed at this time. Notes he has done well since getting out of the hospital. He has continued the vancomycin. No diarrhea. No abdominal pain. Notes he feels weak overall and is having physical therapy out to work with him starting today. Had a normal bowel movement today. No vomiting. Notes stitches have been in place in his right forehead for the last 12 days. No drainage from them. While in the hospital he had a CT scan of his head that revealed what appeared to be a cyst. Had MRI to follow-up on this. This is a known cyst that is followed at [facility]. He has follow-up with them early next year.  PMH: Former smoker   ROS see history of present illness  Objective  Physical Exam Vitals:   09/07/16 1132  BP: 122/70  Pulse: 82  Resp: 18  Temp: 98.5 F (36.9 C)    BP Readings from Last 3 Encounters:  09/07/16 122/70  09/04/16 138/75  08/26/16 (!) 127/54   Wt Readings from Last 3 Encounters:  09/07/16 151 lb 8 oz (68.7 kg)  08/26/16 150 lb (68 kg)  08/26/16 150 lb (68 kg)    Physical Exam  Constitutional: No distress.  HENT:  Head: Normocephalic.  Mouth/Throat: Oropharynx is clear and moist. No oropharyngeal exudate.  Well healing laceration right forehead with 2 running sutures and a single suture, no surrounding erythema, well approximated and healing well  Cardiovascular: Normal rate, regular rhythm and normal heart sounds.   Pulmonary/Chest: Effort normal and breath sounds normal.  Abdominal: Soft. Bowel sounds are normal. He exhibits no distension. There is no tenderness. There is no rebound and no guarding.  Neurological: He is alert. Gait normal.  Skin: Skin is warm and dry. He is not diaphoretic.      Assessment/Plan: Please see individual problem list.  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 He'll continue to follow with neurology for this.  C. difficile diarrhea Patient's prior diarrhea related to C. difficile infection. Overall improving. No diarrhea at this time. On vancomycin. Appears to be doing well although appears to be somewhat weak after 9 days in the hospital. We'll continue vancomycin. He will have physical therapy come out to work with him at home. We will check a BMP today. He'll continue to monitor for recurrence.  Laceration of head Laceration of his head was well healed today. ED note reviewed revealing running sutures. Sutures were removed today and patient tolerated well. 2 running sutures noted and a single other suture noted all suture material appears to been removed. Given return precautions.   Orders Placed This Encounter  Procedures  . Basic metabolic panel    Tommi Rumps, MD [place]

## 2016-09-07 NOTE — Assessment & Plan Note (Signed)
Known colloid cyst found on recent MRI. He'll continue to follow with neurology for this.

## 2016-09-07 NOTE — Assessment & Plan Note (Signed)
Laceration of his head was well healed today. ED note reviewed revealing running sutures. Sutures were removed today and patient tolerated well. 2 running sutures noted and a single other suture noted all suture material appears to been removed. Given return precautions.

## 2016-09-10 ENCOUNTER — Telehealth: Payer: Self-pay | Admitting: Family Medicine

## 2016-09-10 DIAGNOSIS — R531 Weakness: Secondary | ICD-10-CM | POA: Diagnosis not present

## 2016-09-10 DIAGNOSIS — R2689 Other abnormalities of gait and mobility: Secondary | ICD-10-CM | POA: Diagnosis not present

## 2016-09-10 DIAGNOSIS — A047 Enterocolitis due to Clostridium difficile: Secondary | ICD-10-CM | POA: Diagnosis not present

## 2016-09-10 NOTE — Telephone Encounter (Signed)
They are ok with me. Thanks.

## 2016-09-10 NOTE — Telephone Encounter (Signed)
Are these vitals OK with you.

## 2016-09-10 NOTE — Telephone Encounter (Signed)
Pt wife called stating that pt had physical therapy today and before he started any exercises or anything his heart rate 94, he feels ok and his bp is 112/68Pt just wants to make sure that this is ok?   Call pt @ [phone]

## 2016-09-11 NOTE — Telephone Encounter (Signed)
Notified patients wife that vitals was fine

## 2016-09-12 DIAGNOSIS — A047 Enterocolitis due to Clostridium difficile: Secondary | ICD-10-CM | POA: Diagnosis not present

## 2016-09-12 DIAGNOSIS — R531 Weakness: Secondary | ICD-10-CM | POA: Diagnosis not present

## 2016-09-12 DIAGNOSIS — R2689 Other abnormalities of gait and mobility: Secondary | ICD-10-CM | POA: Diagnosis not present

## 2016-09-13 DIAGNOSIS — D485 Neoplasm of uncertain behavior of skin: Secondary | ICD-10-CM | POA: Diagnosis not present

## 2016-09-13 DIAGNOSIS — L570 Actinic keratosis: Secondary | ICD-10-CM | POA: Diagnosis not present

## 2016-09-13 DIAGNOSIS — D044 Carcinoma in situ of skin of scalp and neck: Secondary | ICD-10-CM | POA: Diagnosis not present

## 2016-09-13 DIAGNOSIS — X32XXXA Exposure to sunlight, initial encounter: Secondary | ICD-10-CM | POA: Diagnosis not present

## 2016-09-14 DIAGNOSIS — R531 Weakness: Secondary | ICD-10-CM | POA: Diagnosis not present

## 2016-09-14 DIAGNOSIS — A047 Enterocolitis due to Clostridium difficile: Secondary | ICD-10-CM | POA: Diagnosis not present

## 2016-09-14 DIAGNOSIS — R2689 Other abnormalities of gait and mobility: Secondary | ICD-10-CM | POA: Diagnosis not present

## 2016-09-19 DIAGNOSIS — R2689 Other abnormalities of gait and mobility: Secondary | ICD-10-CM | POA: Diagnosis not present

## 2016-09-19 DIAGNOSIS — R531 Weakness: Secondary | ICD-10-CM | POA: Diagnosis not present

## 2016-09-19 DIAGNOSIS — A047 Enterocolitis due to Clostridium difficile: Secondary | ICD-10-CM | POA: Diagnosis not present

## 2016-09-20 DIAGNOSIS — A047 Enterocolitis due to Clostridium difficile: Secondary | ICD-10-CM | POA: Diagnosis not present

## 2016-09-20 DIAGNOSIS — R531 Weakness: Secondary | ICD-10-CM | POA: Diagnosis not present

## 2016-09-20 DIAGNOSIS — R2689 Other abnormalities of gait and mobility: Secondary | ICD-10-CM | POA: Diagnosis not present

## 2016-09-24 ENCOUNTER — Telehealth: Payer: Self-pay | Admitting: Family Medicine

## 2016-09-24 NOTE — Telephone Encounter (Signed)
Pt wife called about pt has diarrhea with chills and not feeling well. No vomiting. Temp 101.5. Pt was transferred to [facility].   Call pt @ [phone]. Thank you!

## 2016-09-24 NOTE — Telephone Encounter (Signed)
Scheduled patient appointment with Dr. Lacinda Axon tomorrow. HX of C. Diff

## 2016-09-25 ENCOUNTER — Ambulatory Visit (INDEPENDENT_AMBULATORY_CARE_PROVIDER_SITE_OTHER): Payer: Medicare Other | Admitting: Family Medicine

## 2016-09-25 ENCOUNTER — Encounter: Payer: Self-pay | Admitting: Family Medicine

## 2016-09-25 VITALS — BP 106/62 | HR 97 | Temp 97.7°F | Wt 143.0 lb

## 2016-09-25 DIAGNOSIS — R197 Diarrhea, unspecified: Secondary | ICD-10-CM

## 2016-09-25 DIAGNOSIS — R509 Fever, unspecified: Secondary | ICD-10-CM | POA: Diagnosis not present

## 2016-09-25 LAB — COMPREHENSIVE METABOLIC PANEL
ALBUMIN: 3.5 g/dL (ref 3.5–5.2)
ALK PHOS: 67 U/L (ref 39–117)
ALT: 16 U/L (ref 0–53)
AST: 24 U/L (ref 0–37)
BILIRUBIN TOTAL: 1 mg/dL (ref 0.2–1.2)
BUN: 15 mg/dL (ref 6–23)
CO2: 27 mEq/L (ref 19–32)
CREATININE: 1.07 mg/dL (ref 0.40–1.50)
Calcium: 9.3 mg/dL (ref 8.4–10.5)
Chloride: 94 mEq/L — ABNORMAL LOW (ref 96–112)
GFR: 71.62 mL/min (ref >60.00)
GLUCOSE: 118 mg/dL — AB (ref 70–99)
POTASSIUM: 4.4 meq/L (ref 3.5–5.1)
SODIUM: 130 meq/L — AB (ref 135–145)
TOTAL PROTEIN: 8.4 g/dL — AB (ref 6.0–8.3)

## 2016-09-25 LAB — CBC
HCT: 35.6 % — ABNORMAL LOW (ref 39.0–52.0)
Hemoglobin: 12 g/dL — ABNORMAL LOW (ref 13.0–17.0)
MCHC: 33.8 g/dL (ref 30.0–36.0)
MCV: 91.8 fl (ref 78.0–100.0)
Platelets: 325 10*3/uL (ref 150.0–400.0)
RBC: 3.88 Mil/uL — AB (ref 4.22–5.81)
RDW: 15.1 % (ref 11.5–15.5)
WBC: 10.9 10*3/uL — AB (ref 4.0–10.5)

## 2016-09-25 NOTE — Progress Notes (Signed)
Pre visit review using our clinic review tool, if applicable. No additional management support is needed unless otherwise documented below in the visit note. 

## 2016-09-25 NOTE — Progress Notes (Signed)
Subjective:  Patient ID: Henry Mays, male    DOB: July 01, 1941  Age: 75 y.o. MRN: [id number]  CC: Diarrhea, fever  HPI:  75 year old male with a recent hospitalization for fall and C. difficile colitis presents with complaints of diarrhea and fever.  Patient states that he's not been feeling well since Saturday. He states he's been having diarrhea. He states that he has been going to the restroom frequently but produces only a small amount of liquid stool. He states that this is only happened 2 or 3 times. No current abdominal pain. He has had associated fever (last night 101.5) and chills. He reports decreased food intake but states that he's trying to hydrate with Powerade and water. No other reported symptoms. No known exacerbating or relieving factors. No other complaints this time.  Social Hx   Social History   Social History  . Marital status: Married    Spouse name: N/A  . Number of children: N/A  . Years of education: N/A   Social History Main Topics  . Smoking status: Former Research scientist (life sciences)  . Smokeless tobacco: Never Used  . Alcohol use 6.0 oz/week    10 Standard drinks or equivalent per week  . Drug use: No  . Sexual activity: Not Asked   Other Topics Concern  . None   Social History Narrative  . None   Review of Systems  Constitutional: Positive for chills, fatigue and fever.  Gastrointestinal: Positive for diarrhea.   Objective:  BP 106/62 (BP Location: Left Arm, Patient Position: Sitting, Cuff Size: Normal)   Pulse 97   Temp 97.7 F (36.5 C) (Oral)   Wt 143 lb (64.9 kg)   SpO2 97%   BMI 21.74 kg/m   BP/Weight 09/25/2016 09/07/2016 05/30/3085  Systolic BP 578 469 629  Diastolic BP 62 70 75  Wt. (Lbs) 143 151.5 -  BMI 21.74 23.04 -   Physical Exam  Constitutional:  Chronically ill-appearing male in no acute distress.  HENT:  Lips slightly dry.  Cardiovascular:  Tachycardic.  Pulmonary/Chest: Effort normal and breath sounds normal.  Abdominal: Soft.  He exhibits no distension. There is no tenderness. There is no rebound and no guarding.  Neurological: He is alert.  Vitals reviewed.  Lab Results  Component Value Date   WBC 6.3 09/04/2016   HGB 10.2 (L) 09/04/2016   HCT 29.8 (L) 09/04/2016   PLT 306 09/04/2016   GLUCOSE 116 (H) 09/07/2016   ALT 24 08/26/2016   AST 29 08/26/2016   NA 134 (L) 09/07/2016   K 4.6 09/07/2016   CL 98 09/07/2016   CREATININE 0.96 09/07/2016   BUN 10 09/07/2016   CO2 31 09/07/2016   TSH 0.840 08/26/2016    Assessment & Plan:   Problem List Items Addressed This Visit    Fever    New problem. Patient reporting diarrhea but he's had only a few episodes of loose stool. I am concerned about C. difficile colitis as he has had this recently and has no symptoms or findings (other than fever, chills). Obtaining laboratory studies today. Advised aggressive hydration. Advised family member that if he worsens to take him to the hospital.      Relevant Orders   Stool C-Diff Toxin Assay   CBC   Comp Met (CMET)   Diarrhea - Primary    Patient reporting diarrhea but he's had only a few episodes of loose stool. I am concerned about C. difficile colitis given his recent history. Obtaining C diff PCR.  Given that he's not producing much in the way of stool, holding off on empiric antibiotics at this time. Obtaining laboratory studies today. If white count elevated and he continues to have fever, will send to hospital.       Other Visit Diagnoses   None.    Follow-up: PRN  [place]

## 2016-09-25 NOTE — Patient Instructions (Signed)
Hydrate, Hydrate, Hydrate.  We will call with your lab results.  If he worsens, take him to the hospital.  Take care  Dr. Lacinda Axon

## 2016-09-25 NOTE — Assessment & Plan Note (Signed)
Patient reporting diarrhea but he's had only a few episodes of loose stool. I am concerned about C. difficile colitis given his recent history. Obtaining C diff PCR.  Given that he's not producing much in the way of stool, holding off on empiric antibiotics at this time. Obtaining laboratory studies today. If white count elevated and he continues to have fever, will send to hospital.

## 2016-09-25 NOTE — Assessment & Plan Note (Signed)
New problem. Patient reporting diarrhea but he's had only a few episodes of loose stool. I am concerned about C. difficile colitis as he has had this recently and has no symptoms or findings (other than fever, chills). Obtaining laboratory studies today. Advised aggressive hydration. Advised family member that if he worsens to take him to the hospital.

## 2016-09-26 ENCOUNTER — Encounter: Payer: Self-pay | Admitting: *Deleted

## 2016-09-26 ENCOUNTER — Inpatient Hospital Stay
Admission: EM | Admit: 2016-09-26 | Discharge: 2016-10-01 | DRG: 372 | Disposition: A | Discharge status: Home / Self Care | Payer: Medicare Other | Attending: Internal Medicine | Admitting: Internal Medicine

## 2016-09-26 DIAGNOSIS — N179 Acute kidney failure, unspecified: Secondary | ICD-10-CM | POA: Diagnosis present

## 2016-09-26 DIAGNOSIS — Z85828 Personal history of other malignant neoplasm of skin: Secondary | ICD-10-CM

## 2016-09-26 DIAGNOSIS — A0471 Enterocolitis due to Clostridium difficile, recurrent: Secondary | ICD-10-CM | POA: Diagnosis not present

## 2016-09-26 DIAGNOSIS — I10 Essential (primary) hypertension: Secondary | ICD-10-CM | POA: Diagnosis present

## 2016-09-26 DIAGNOSIS — R7301 Impaired fasting glucose: Secondary | ICD-10-CM | POA: Diagnosis present

## 2016-09-26 DIAGNOSIS — Z87891 Personal history of nicotine dependence: Secondary | ICD-10-CM

## 2016-09-26 DIAGNOSIS — Z8249 Family history of ischemic heart disease and other diseases of the circulatory system: Secondary | ICD-10-CM

## 2016-09-26 DIAGNOSIS — E871 Hypo-osmolality and hyponatremia: Secondary | ICD-10-CM | POA: Diagnosis present

## 2016-09-26 DIAGNOSIS — E876 Hypokalemia: Secondary | ICD-10-CM | POA: Diagnosis present

## 2016-09-26 DIAGNOSIS — K6389 Other specified diseases of intestine: Secondary | ICD-10-CM | POA: Diagnosis not present

## 2016-09-26 DIAGNOSIS — N289 Disorder of kidney and ureter, unspecified: Secondary | ICD-10-CM

## 2016-09-26 DIAGNOSIS — A0472 Enterocolitis due to Clostridium difficile, not specified as recurrent: Secondary | ICD-10-CM | POA: Diagnosis present

## 2016-09-26 DIAGNOSIS — Z79899 Other long term (current) drug therapy: Secondary | ICD-10-CM | POA: Diagnosis not present

## 2016-09-26 DIAGNOSIS — Z87442 Personal history of urinary calculi: Secondary | ICD-10-CM

## 2016-09-26 DIAGNOSIS — E860 Dehydration: Secondary | ICD-10-CM | POA: Diagnosis present

## 2016-09-26 DIAGNOSIS — Z806 Family history of leukemia: Secondary | ICD-10-CM

## 2016-09-26 DIAGNOSIS — R197 Diarrhea, unspecified: Secondary | ICD-10-CM | POA: Diagnosis not present

## 2016-09-26 HISTORY — DX: Essential (primary) hypertension: I10

## 2016-09-26 LAB — CBC WITH DIFFERENTIAL/PLATELET
BASOS PCT: 1 %
Basophils Absolute: 0.1 10*3/uL (ref 0–0.1)
EOS ABS: 0 10*3/uL (ref 0–0.7)
Eosinophils Relative: 0 %
HCT: 36.8 % — ABNORMAL LOW (ref 40.0–52.0)
HEMOGLOBIN: 12.7 g/dL — AB (ref 13.0–18.0)
LYMPHS ABS: 0.8 10*3/uL — AB (ref 1.0–3.6)
Lymphocytes Relative: 8 %
MCH: 31.7 pg (ref 26.0–34.0)
MCHC: 34.5 g/dL (ref 32.0–36.0)
MCV: 91.8 fL (ref 80.0–100.0)
MONO ABS: 1.4 10*3/uL — AB (ref 0.2–1.0)
MONOS PCT: 14 %
Neutro Abs: 7.7 10*3/uL — ABNORMAL HIGH (ref 1.4–6.5)
Neutrophils Relative %: 77 %
Platelets: 292 10*3/uL (ref 150–440)
RBC: 4.01 MIL/uL — ABNORMAL LOW (ref 4.40–5.90)
RDW: 15 % — AB (ref 11.5–14.5)
WBC: 10 10*3/uL (ref 3.8–10.6)

## 2016-09-26 LAB — COMPREHENSIVE METABOLIC PANEL
ALBUMIN: 3.3 g/dL — AB (ref 3.5–5.0)
ALK PHOS: 58 U/L (ref 38–126)
ALT: 22 U/L (ref 17–63)
ANION GAP: 10 (ref 5–15)
AST: 30 U/L (ref 15–41)
BILIRUBIN TOTAL: 1.4 mg/dL — AB (ref 0.3–1.2)
BUN: 25 mg/dL — ABNORMAL HIGH (ref 6–20)
CALCIUM: 8.7 mg/dL — AB (ref 8.9–10.3)
CO2: 25 mmol/L (ref 22–32)
Chloride: 96 mmol/L — ABNORMAL LOW (ref 101–111)
Creatinine, Ser: 1.84 mg/dL — ABNORMAL HIGH (ref 0.61–1.24)
GFR calc non Af Amer: 34 mL/min — ABNORMAL LOW (ref >60)
GFR, EST AFRICAN AMERICAN: 40 mL/min — AB (ref >60)
GLUCOSE: 118 mg/dL — AB (ref 65–99)
POTASSIUM: 3.8 mmol/L (ref 3.5–5.1)
SODIUM: 131 mmol/L — AB (ref 135–145)
TOTAL PROTEIN: 8.5 g/dL — AB (ref 6.5–8.1)

## 2016-09-26 LAB — GASTROINTESTINAL PANEL BY PCR, STOOL (REPLACES STOOL CULTURE)

## 2016-09-26 LAB — C DIFFICILE QUICK SCREEN W PCR REFLEX
C DIFFICILE (CDIFF) TOXIN: NEGATIVE
C Diff antigen: POSITIVE — AB

## 2016-09-26 LAB — CLOSTRIDIUM DIFFICILE BY PCR: CDIFFPCR: POSITIVE — AB

## 2016-09-26 MED ORDER — ORAL CARE MOUTH RINSE
15.0000 mL | Freq: Two times a day (BID) | OROMUCOSAL | Status: DC
  Administered 2016-09-28 – 2016-10-01 (×7): 15 mL via OROMUCOSAL

## 2016-09-26 MED ORDER — ACETAMINOPHEN 650 MG RE SUPP: 650.0000 mg | Freq: Four times a day (QID) | RECTAL | Status: DC | PRN

## 2016-09-26 MED ORDER — SODIUM CHLORIDE 0.9 % IV SOLN
INTRAVENOUS | Status: DC
  Administered 2016-09-26: 17:00:00 via INTRAVENOUS

## 2016-09-26 MED ORDER — CHLORHEXIDINE GLUCONATE 0.12 % MT SOLN
15.0000 mL | Freq: Two times a day (BID) | OROMUCOSAL | Status: DC
  Administered 2016-09-26 – 2016-10-01 (×9): 15 mL via OROMUCOSAL
  Filled 2016-09-26 (×10): qty 15

## 2016-09-26 MED ORDER — ENOXAPARIN SODIUM 40 MG/0.4ML ~~LOC~~ SOLN
40.0000 mg | SUBCUTANEOUS | Status: DC
  Administered 2016-09-26 – 2016-09-30 (×5): 40 mg via SUBCUTANEOUS
  Filled 2016-09-26 (×5): qty 0.4

## 2016-09-26 MED ORDER — SODIUM CHLORIDE 0.9 % IV BOLUS (SEPSIS)
1000.0000 mL | Freq: Once | INTRAVENOUS | Status: AC
  Administered 2016-09-26: 1000 mL via INTRAVENOUS

## 2016-09-26 MED ORDER — SODIUM CHLORIDE 0.9 % IV BOLUS (SEPSIS): 1000.0000 mL | Freq: Once | INTRAVENOUS | Status: DC

## 2016-09-26 MED ORDER — VANCOMYCIN 50 MG/ML ORAL SOLUTION
250.0000 mg | Freq: Four times a day (QID) | ORAL | Status: DC
  Administered 2016-09-26 – 2016-10-01 (×20): 250 mg via ORAL
  Filled 2016-09-26 (×23): qty 5

## 2016-09-26 MED ORDER — ACETAMINOPHEN 325 MG PO TABS: 650.0000 mg | ORAL_TABLET | Freq: Four times a day (QID) | ORAL | Status: DC | PRN

## 2016-09-26 NOTE — ED Triage Notes (Signed)
Pt was discharged from an inpatient stay 10/3 with C-diff, pt reports diarrhea started 4 days ago

## 2016-09-26 NOTE — ED Notes (Signed)
Pt ambulatory to toilet with no assistance. Steady gait noted back to bed.

## 2016-09-26 NOTE — ED Provider Notes (Signed)
[facility] Emergency Department Provider Note  ____________________________________________  Time seen: Approximately 3:53 PM  I have reviewed the triage vital signs and the nursing notes.   HISTORY  Chief Complaint Diarrhea    HPI Henry Mays is a 75 y.o. male who complains of generalized abdominal pain and watery diarrhea for the past 4 days. Reports 3 weeks ago he was hospitalized recently. He was treated with Flagyl and Timentin symptoms resolved. Now the recurrent. Also been having difficulty eating and normally her last few days. He feels generally weak. No blood in stool or vomiting. No chest pain shortness of breath or fever.     Past Medical History:  Diagnosis Date  . Arthritis   . Basal cell carcinoma   . Chickenpox   . Kidney stones      Patient Active Problem List   Diagnosis Date Noted  . Fever 09/25/2016  . Diarrhea 09/25/2016  . Laceration of head 09/07/2016  . Orthostatic hypotension 08/28/2016  . PVC (premature ventricular contraction) 08/28/2016  . C. difficile diarrhea   . Minor head injury   . Near syncope   . Intractable hiccups 08/07/2016  . Cellulitis 08/07/2016  . Colloid cyst of brain ([place]) 05/22/2016  . Osteoporosis 05/22/2016  . Decreased right shoulder range of motion 04/10/2016  . Essential hypertension 04/10/2016  . MGUS (monoclonal gammopathy of unknown significance) 04/10/2016     Past Surgical History:  Procedure Laterality Date  . BASAL CELL CARCINOMA EXCISION    . HERNIA REPAIR       Prior to Admission medications   Medication Sig Start Date End Date Taking? Authorizing Provider  dexamethasone (DECADRON) 0.5 MG/5ML solution Take 5 mLs by mouth daily. 08/16/15  Yes Historical Provider, MD  lisinopril (PRINIVIL,ZESTRIL) 5 MG tablet TAKE 1 TABLET BY MOUTH DAILY 06/25/16   Leone Haven, MD     Allergies Review of patient's allergies indicates no known allergies.   Family History  Problem  Relation Age of Onset  . Arthritis    . Stroke    . Hypertension    . Leukemia Father     Social History Social History  Substance Use Topics  . Smoking status: Former Research scientist (life sciences)  . Smokeless tobacco: Never Used  . Alcohol use 6.0 oz/week    10 Standard drinks or equivalent per week    Review of Systems  Constitutional:   No fever or chills.   Cardiovascular:   No chest pain. Respiratory:   No dyspnea or cough. Gastrointestinal:   Positive generalized abdominal pain with diarrhea.  Genitourinary:   Negative for dysuria or difficulty urinating.  10-point ROS otherwise negative.  ____________________________________________   PHYSICAL EXAM:  VITAL SIGNS: ED Triage Vitals  Enc Vitals Group     BP 09/26/16 1228 116/61     Pulse Rate 09/26/16 1228 87     Resp --      Temp --      Temp src --      SpO2 09/26/16 1228 100 %     Weight 09/26/16 1014 143 lb (64.9 kg)     Height 09/26/16 1014 5\' 8"  (1.727 m)     Head Circumference --      Peak Flow --      Pain Score --      Pain Loc --      Pain Edu? --      Excl. in [place]? --     Vital signs reviewed, nursing assessments reviewed.  Constitutional:   Alert and oriented.Not in distress Eyes:   No scleral icterus. No conjunctival pallor. PERRL. EOMI.  No nystagmus. ENT   Head:   Normocephalic and atraumatic.   Nose:   No congestion/rhinnorhea. No septal hematoma   Mouth/Throat:   Dry mucous membranes, no pharyngeal erythema. No peritonsillar mass.    Neck:   No stridor. No SubQ emphysema. No meningismus. Hematological/Lymphatic/Immunilogical:   No cervical lymphadenopathy. Cardiovascular:   RRR. Symmetric bilateral radial and DP pulses.  No murmurs.  Respiratory:   Normal respiratory effort without tachypnea nor retractions. Breath sounds are clear and equal bilaterally. No wheezes/rales/rhonchi. Gastrointestinal:   Soft with generalized tenderness. Non distended. There is no CVA tenderness.  No rebound,  rigidity, or guarding. Genitourinary:   deferred Musculoskeletal:   Nontender with normal range of motion in all extremities. No joint effusions.  No lower extremity tenderness.  No edema. Neurologic:   Normal speech and language.  CN 2-10 normal. Motor grossly intact. No gross focal neurologic deficits are appreciated.  Skin:    Skin is warm, dry and intact. No rash noted.  No petechiae, purpura, or bullae.  ____________________________________________    LABS (pertinent positives/negatives) (all labs ordered are listed, but only abnormal results are displayed) Labs Reviewed  C DIFFICILE QUICK SCREEN W PCR REFLEX - Abnormal; Notable for the following:       Result Value   C Diff antigen POSITIVE (*)    All other components within normal limits  CLOSTRIDIUM DIFFICILE BY PCR - Abnormal; Notable for the following:    Toxigenic C Difficile by pcr POSITIVE (*)    All other components within normal limits  CBC WITH DIFFERENTIAL/PLATELET - Abnormal; Notable for the following:    RBC 4.01 (*)    Hemoglobin 12.7 (*)    HCT 36.8 (*)    RDW 15.0 (*)    Neutro Abs 7.7 (*)    Lymphs Abs 0.8 (*)    Monocytes Absolute 1.4 (*)    All other components within normal limits  COMPREHENSIVE METABOLIC PANEL - Abnormal; Notable for the following:    Sodium 131 (*)    Chloride 96 (*)    Glucose, Bld 118 (*)    BUN 25 (*)    Creatinine, Ser 1.84 (*)    Calcium 8.7 (*)    Total Protein 8.5 (*)    Albumin 3.3 (*)    Total Bilirubin 1.4 (*)    GFR calc non Af Amer 34 (*)    GFR calc Af Amer 40 (*)    All other components within normal limits  GASTROINTESTINAL PANEL BY PCR, STOOL (REPLACES STOOL CULTURE)   ____________________________________________   EKG    ____________________________________________    RADIOLOGY    ____________________________________________   PROCEDURES Procedures  ____________________________________________   INITIAL IMPRESSION / ASSESSMENT AND  PLAN / ED COURSE  Pertinent labs & imaging results that were available during my care of the patient were reviewed by me and considered in my medical decision making (see chart for details).  Patient presents with watery diarrhea and abdominal pain with history of C. difficile. C. difficile testing today is positive given his symptoms. Labs also show acute renal insufficiency consistent with dehydration. Discussed possible treatment options with patient, feels uncomfortable with outpatient management at this time. Given the diminished renal function and recurrent nature of this C. difficile, hospitalization seems reasonable and I discussed the case with the hospitalist for further management.     Clinical Course  ____________________________________________   FINAL CLINICAL IMPRESSION(S) / ED DIAGNOSES  Final diagnoses:  Acute renal insufficiency  C. difficile diarrhea  Dehydration       Portions of this note were generated with dragon dictation software. Dictation errors may occur despite best attempts at proofreading.    Carrie Mew, MD 09/26/16 [phone]

## 2016-09-26 NOTE — H&P (Signed)
[facility] [facility] NAME: Henry Mays    MR#:  [id number]  DATE OF BIRTH:  1941-03-20  DATE OF ADMISSION:  09/26/2016  PRIMARY CARE PHYSICIAN: Tommi Rumps, MD   REQUESTING/REFERRING PHYSICIAN: Dr Carrie Mew  CHIEF COMPLAINT:   Chief Complaint  Patient presents with  . Diarrhea    HISTORY OF PRESENT ILLNESS:  Henry Mays  is a 75 y.o. male with a known history of Recent C. difficile colitis. Patient was hospitalized last month for this C. difficile colitis. He states he was on antibiotic vancomycin for at least a week after hospital discharge. He did well while on the antibiotic. Once the antibiotic was stopped he started to develop some fluid out of his rectum and occasional diarrhea. Patient stated he had a fever of 101 the other night. He also had chills. He started developing more diarrhea and went to the medical doctor on Tuesday. Since he had a lot of diarrhea last night he came into the ER today.  PAST MEDICAL HISTORY:   Past Medical History:  Diagnosis Date  . Arthritis   . Basal cell carcinoma   . Chickenpox   . Hypertension   . Kidney stones     PAST SURGICAL HISTORY:   Past Surgical History:  Procedure Laterality Date  . BASAL CELL CARCINOMA EXCISION    . HERNIA REPAIR      SOCIAL HISTORY:   Social History  Substance Use Topics  . Smoking status: Former Research scientist (life sciences)  . Smokeless tobacco: Never Used  . Alcohol use 6.0 oz/week    10 Standard drinks or equivalent per week     Comment: Not drinking any alcohol since c diff diagnosis    FAMILY HISTORY:   Family History  Problem Relation Age of Onset  . Arthritis    . Stroke    . Hypertension    . Leukemia Father   . CAD Mother     DRUG ALLERGIES:  No Known Allergies  REVIEW OF SYSTEMS:  CONSTITUTIONAL: Positive for fever and chills. Positive for fatigue. Positive for some weight loss. Positive for no appetite EYES: No blurred or  double vision. Wears glasses. EARS, NOSE, AND THROAT: No tinnitus or ear pain. No sore throat. Wears hearing aids RESPIRATORY: No cough, shortness of breath, wheezing or hemoptysis.  CARDIOVASCULAR: No chest pain, orthopnea, edema.  GASTROINTESTINAL: No nausea, vomiting. Positive for diarrhea and abdominal cramping. No blood in bowel movements GENITOURINARY: No dysuria, hematuria.  ENDOCRINE: No polyuria, nocturia,  HEMATOLOGY: No anemia, easy bruising or bleeding SKIN: No rash or lesion. MUSCULOSKELETAL: Some joint pain.   NEUROLOGIC: No tingling, numbness, weakness.  PSYCHIATRY: No anxiety or depression.   MEDICATIONS AT HOME:   Prior to Admission medications   Medication Sig Start Date End Date Taking? Authorizing Provider  dexamethasone (DECADRON) 0.5 MG/5ML solution Take 5 mLs by mouth daily. 08/16/15  Yes Historical Provider, MD  lisinopril (PRINIVIL,ZESTRIL) 5 MG tablet TAKE 1 TABLET BY MOUTH DAILY 06/25/16   Leone Haven, MD    Patient is not taking dexamethasone. Medication reconciliation process still undergoing.  VITAL SIGNS:  Blood pressure 121/69, pulse 93, height 5\' 8"  (1.727 m), weight 64.9 kg (143 lb), SpO2 100 %.  PHYSICAL EXAMINATION:  GENERAL:  75 y.o.-year-old patient lying in the bed with no acute distress.  EYES: Pupils equal, round, reactive to light and accommodation. No scleral icterus. Extraocular muscles intact.  HEENT: Head atraumatic, normocephalic. Oropharynx and nasopharynx clear.  NECK:  Supple, no jugular venous distention. No thyroid enlargement, no tenderness.  LUNGS: Normal breath sounds bilaterally, no wheezing, rales,rhonchi or crepitation. No use of accessory muscles of respiration.  CARDIOVASCULAR: S1, S2 normal. No murmurs, rubs, or gallops.  ABDOMEN: Soft, nontender, nondistended. Bowel sounds present. No organomegaly or mass.  EXTREMITIES: No pedal edema, cyanosis, or clubbing.  NEUROLOGIC: Cranial nerves II through XII are intact. Muscle  strength 5/5 in all extremities. Sensation intact. Gait not checked.  PSYCHIATRIC: The patient is alert and oriented x 3.  SKIN: No rash, lesion, or ulcer.   LABORATORY PANEL:   CBC  Recent Labs Lab 09/26/16 1149  WBC 10.0  HGB 12.7*  HCT 36.8*  PLT 292   ------------------------------------------------------------------------------------------------------------------  Chemistries   Recent Labs Lab 09/26/16 1149  NA 131*  K 3.8  CL 96*  CO2 25  GLUCOSE 118*  BUN 25*  CREATININE 1.84*  CALCIUM 8.7*  AST 30  ALT 22  ALKPHOS 58  BILITOT 1.4*   ------------------------------------------------------------------------------------------------------------------   IMPRESSION AND PLAN:   1.  Recurrent Clostridium difficile colitis.  Restart oral vancomycin every 6 hours. Monitor diarrhea. Case discussed with Dr. Vira Agar gastroenterology who is a physician that can do stool transplants if needed. He will see the patient in consultation. 2. Acute kidney injury, Dehydration and Hyponatremia. Hold lisinopril. IV fluid hydration with normal saline. 3. Impaired fasting glucose. Check a hemoglobin A1c 4. Elevated total bilirubin likely from poor appetite and dehydration 5. Essential hypertension. Blood pressure stable. Continue to hold lisinopril.  All the records are reviewed and case discussed with ED provider. Management plans discussed with the patient, family and they are in agreement.  CODE STATUS: Full code  TOTAL TIME TAKING CARE OF THIS PATIENT: 50 minutes.    Loletha Grayer M.D on 09/26/2016 at 4:35 PM  Between 7am to 6pm - Pager - [phone]  After 6pm call admission pager [phone]  [facility]  [phone]  CC: Primary care physician; Tommi Rumps, MD

## 2016-09-26 NOTE — ED Notes (Signed)
Admitting MD at bedside.

## 2016-09-27 LAB — BASIC METABOLIC PANEL
ANION GAP: 6 (ref 5–15)
BUN: 19 mg/dL (ref 6–20)
CHLORIDE: 103 mmol/L (ref 101–111)
CO2: 23 mmol/L (ref 22–32)
CREATININE: 1.24 mg/dL (ref 0.61–1.24)
Calcium: 7.9 mg/dL — ABNORMAL LOW (ref 8.9–10.3)
GFR calc non Af Amer: 56 mL/min — ABNORMAL LOW (ref >60)
Glucose, Bld: 111 mg/dL — ABNORMAL HIGH (ref 65–99)
POTASSIUM: 3.4 mmol/L — AB (ref 3.5–5.1)
SODIUM: 132 mmol/L — AB (ref 135–145)

## 2016-09-27 LAB — CBC
HCT: 30.8 % — ABNORMAL LOW (ref 40.0–52.0)
HEMOGLOBIN: 10.5 g/dL — AB (ref 13.0–18.0)
MCH: 31.7 pg (ref 26.0–34.0)
MCHC: 34.2 g/dL (ref 32.0–36.0)
MCV: 92.7 fL (ref 80.0–100.0)
PLATELETS: 243 10*3/uL (ref 150–440)
RBC: 3.32 MIL/uL — AB (ref 4.40–5.90)
RDW: 14.7 % — ABNORMAL HIGH (ref 11.5–14.5)
WBC: 5.4 10*3/uL (ref 3.8–10.6)

## 2016-09-27 LAB — MAGNESIUM: Magnesium: 1.6 mg/dL — ABNORMAL LOW (ref 1.7–2.4)

## 2016-09-27 MED ORDER — SACCHAROMYCES BOULARDII 250 MG PO CAPS
250.0000 mg | ORAL_CAPSULE | Freq: Two times a day (BID) | ORAL | Status: DC
  Administered 2016-09-27 – 2016-10-01 (×9): 250 mg via ORAL
  Filled 2016-09-27 (×11): qty 1

## 2016-09-27 MED ORDER — POTASSIUM CHLORIDE IN NACL 20-0.9 MEQ/L-% IV SOLN
INTRAVENOUS | Status: DC
  Administered 2016-09-27 (×2): via INTRAVENOUS
  Filled 2016-09-27 (×4): qty 1000

## 2016-09-27 NOTE — Progress Notes (Addendum)
[place] at [place] NAME: Henry Mays    MR#:  [id number]  DATE OF BIRTH:  [birth year]  SUBJECTIVE:  CHIEF COMPLAINT:   Chief Complaint  Patient presents with  . Diarrhea  The patient is 75 year old Caucasian male with medical history significant for history of recent C. difficile colitis, admission for the same about one month ago, who presented to the hospital with recurrent diarrhea, fever to 101. On arrival to emergency room if labs revealed hyponatremia, acute renal failure, toxigenic C. difficile by PCR was also positive. Patient was initiated on vancomycin orally with no significant improvement of his symptoms, patient continues to have diarrheal stool every 1 hour. He feels weak, exhausted. No more fevers  Review of Systems  Constitutional: Positive for malaise/fatigue. Negative for chills, fever and weight loss.  HENT: Negative for congestion.   Eyes: Negative for blurred vision and double vision.  Respiratory: Negative for cough, sputum production, shortness of breath and wheezing.   Cardiovascular: Negative for chest pain, palpitations, orthopnea, leg swelling and PND.  Gastrointestinal: Positive for diarrhea. Negative for abdominal pain, blood in stool, constipation, nausea and vomiting.  Genitourinary: Negative for dysuria, frequency, hematuria and urgency.  Musculoskeletal: Negative for falls.  Neurological: Negative for dizziness, tremors, focal weakness and headaches.  Endo/Heme/Allergies: Does not bruise/bleed easily.  Psychiatric/Behavioral: Negative for depression. The patient does not have insomnia.     VITAL SIGNS: Blood pressure (!) 146/75, pulse (!) 106, temperature 98.1 F (36.7 C), temperature source Oral, resp. rate 18, height 5\' 8"  (1.727 m), weight 62.2 kg (137 lb 1.6 oz), SpO2 100 %.  PHYSICAL EXAMINATION:   GENERAL:  75 y.o.-year-old patient lying in the bed with no acute distress, sitting at the  bedside commode.  EYES: Pupils equal, round, reactive to light and accommodation. No scleral icterus. Extraocular muscles intact.  HEENT: Head atraumatic, normocephalic. Oropharynx and nasopharynx clear.  NECK:  Supple, no jugular venous distention. No thyroid enlargement, no tenderness.  LUNGS: Normal breath sounds bilaterally, no wheezing, rales,rhonchi or crepitation. No use of accessory muscles of respiration.  CARDIOVASCULAR: S1, S2 normal. No murmurs, rubs, or gallops.  ABDOMEN: Soft, nontender, nondistended. Bowel sounds present. No organomegaly or mass.  EXTREMITIES: No pedal edema, cyanosis, or clubbing.  NEUROLOGIC: Cranial nerves II through XII are intact. Muscle strength 5/5 in all extremities. Sensation intact. Gait not checked.  PSYCHIATRIC: The patient is alert and oriented x 3.  SKIN: No obvious rash, lesion, or ulcer.   ORDERS/RESULTS REVIEWED:   CBC  Recent Labs Lab 09/25/16 1329 09/26/16 1149 09/27/16 0408  WBC 10.9* 10.0 5.4  HGB 12.0* 12.7* 10.5*  HCT 35.6* 36.8* 30.8*  PLT 325.0 292 243  MCV 91.8 91.8 92.7  MCH  --  31.7 31.7  MCHC 33.8 34.5 34.2  RDW 15.1 15.0* 14.7*  LYMPHSABS  --  0.8*  --   MONOABS  --  1.4*  --   EOSABS  --  0.0  --   BASOSABS  --  0.1  --    ------------------------------------------------------------------------------------------------------------------  Chemistries   Recent Labs Lab 09/25/16 1329 09/26/16 1149 09/27/16 0408  NA 130* 131* 132*  K 4.4 3.8 3.4*  CL 94* 96* 103  CO2 27 25 23   GLUCOSE 118* 118* 111*  BUN 15 25* 19  CREATININE 1.07 1.84* 1.24  CALCIUM 9.3 8.7* 7.9*  MG  --   --  1.6*  AST 24 30  --   ALT 16 22  --  ALKPHOS 67 58  --   BILITOT 1.0 1.4*  --    ------------------------------------------------------------------------------------------------------------------ estimated creatinine clearance is 46 mL/min (by C-G formula based on SCr of 1.24  mg/dL). ------------------------------------------------------------------------------------------------------------------ No results for input(s): TSH, T4TOTAL, T3FREE, THYROIDAB in the last 72 hours.  Invalid input(s): FREET3  Cardiac Enzymes No results for input(s): CKMB, TROPONINI, MYOGLOBIN in the last 168 hours.  Invalid input(s): CK ------------------------------------------------------------------------------------------------------------------ Invalid input(s): POCBNP ---------------------------------------------------------------------------------------------------------------  RADIOLOGY: No results found.  EKG:  Orders placed or performed during the hospital encounter of 08/26/16  . ED EKG  . ED EKG    ASSESSMENT AND PLAN:  Active Problems:   C. difficile colitis  #1. Recurrent C. difficile colitis, continue vancomycin orally, get gastroenterologist involved for further recommendations, questionable adding Flagyl. Add probiotic #2. Hyponatremia, likely dehydration, follow sodium level in the morning, continue IV fluids #3. Hypokalemia, supplement intravenously, magnesium level was also low, supplement intravenously as well #4. Generalized weakness, get physical therapist involved for recommendations #5. Hyperglycemia, get hemoglobin A1c to rule out diabetes #6. Acute renal injury, improved with IV fluid administration, follow closely, including urinary output, which patient tells me has improved.   Management plans discussed with the patient, patient's wife and they are in agreement.   DRUG ALLERGIES: No Known Allergies  CODE STATUS:     Code Status Orders        Start     Ordered   09/26/16 1619  Full code  Continuous     09/26/16 1619    Code Status History    Date Active Date Inactive Code Status Order ID Comments User Context   08/26/2016 11:45 PM 08/28/2016  8:50 AM Full Code [id number]  Harvie Bridge, DO Inpatient    Advance Directive  Documentation   Flowsheet Row Most Recent Value  Type of Advance Directive  Living will  Pre-existing out of facility DNR order (yellow form or pink MOST form)  No data  "MOST" Form in Place?  No data      TOTAL TIME TAKING CARE OF THIS PATIENT: 40 minutes.    Theodoro Grist M.D on 09/27/2016 at 12:57 PM  Between 7am to 6pm - Pager - [phone]  After 6pm go to [email or web address] - password [email or web address] [facility]  [place] [facility]  Office  [phone]  CC: Primary care physician; Tommi Rumps, MD

## 2016-09-27 NOTE — Consult Note (Signed)
Patient with C. Diff colitis with multiple bowel movements today, about one every hour, on potassium and magnesium supplementation.  It usually takes about 3-4 days to really turn this around.  Hydration and electrolyte supplements are most important at this time.  No data that iv flagyl adds to the clinical course as long as can keep down vancomycin.  I talked to him about possible stool transplant after treatment due to fact that he has had over 10 days in hospital due to this infection(current infection is a repeat) counting previous admission.  You can try Flagyl 500mg  iv q 8 hours if you like, may cause nausea and vomiting.  If so then discontinue it.  Will follow with you.

## 2016-09-27 NOTE — Plan of Care (Signed)
Problem: Bowel/Gastric: Goal: Will not experience complications related to bowel motility Outcome: Progressing Patient without episode of diarrhea this shift.

## 2016-09-27 NOTE — Plan of Care (Signed)
Problem: Physical Regulation: Goal: Will remain free from infection Outcome: Progressing Pt receiving antibiotics  Problem: Bowel/Gastric: Goal: Will not experience complications related to bowel motility Outcome: Not Progressing Pt has continued diarrhea from positive C. Diff

## 2016-09-28 LAB — MAGNESIUM: MAGNESIUM: 1.7 mg/dL (ref 1.7–2.4)

## 2016-09-28 LAB — BASIC METABOLIC PANEL
Anion gap: 5 (ref 5–15)
BUN: 9 mg/dL (ref 6–20)
CALCIUM: 7.8 mg/dL — AB (ref 8.9–10.3)
CHLORIDE: 107 mmol/L (ref 101–111)
CO2: 21 mmol/L — AB (ref 22–32)
CREATININE: 0.92 mg/dL (ref 0.61–1.24)
GFR calc non Af Amer: >60 mL/min (ref >60)
Glucose, Bld: 113 mg/dL — ABNORMAL HIGH (ref 65–99)
Potassium: 4.5 mmol/L (ref 3.5–5.1)
SODIUM: 133 mmol/L — AB (ref 135–145)

## 2016-09-28 LAB — HEMOGLOBIN A1C
HEMOGLOBIN A1C: 5.9 % — AB (ref 4.8–5.6)
Mean Plasma Glucose: 123 mg/dL

## 2016-09-28 LAB — HEMOGLOBIN: Hemoglobin: 10.9 g/dL — ABNORMAL LOW (ref 13.0–18.0)

## 2016-09-28 MED ORDER — METRONIDAZOLE 500 MG PO TABS
500.0000 mg | ORAL_TABLET | Freq: Three times a day (TID) | ORAL | Status: DC
Start: 2016-09-28 — End: 2016-10-01
  Administered 2016-09-28 – 2016-10-01 (×11): 500 mg via ORAL
  Filled 2016-09-28 (×11): qty 1

## 2016-09-28 MED ORDER — SODIUM CHLORIDE 0.9 % IV SOLN
INTRAVENOUS | Status: AC
  Administered 2016-09-28 – 2016-09-29 (×3): via INTRAVENOUS

## 2016-09-28 NOTE — Consult Note (Signed)
Patient doing better with 6 hours from last bowel movement.  Flagyl is absorbed into the body via GI tract and excreted into the colon.  Therefore as the colon inflammation  improves the Flagyl loses some of its potency due to less excreted when inflammation is better.  Vancomycin is definitely a better drug for when he goes home.  He is better and I will sign off.  He can call if he wants to do the stool transplant.  Recommend 10-14 days of vancomycin after discharge.

## 2016-09-28 NOTE — Care Management Important Message (Signed)
Important Message  Patient Details  Name: Henry Mays MRN: [id number] Date of Birth: Oct 01, 1941   Medicare Important Message Given:  Yes    Shelbie Ammons, RN 09/28/2016, 9:33 AM

## 2016-09-28 NOTE — Progress Notes (Signed)
[facility] at [place] NAME: Henry Mays    MR#:  [id number]  DATE OF BIRTH:  21-Oct-1941  SUBJECTIVE:  CHIEF COMPLAINT:   Chief Complaint  Patient presents with  . Diarrhea  The patient is 75 year old Caucasian male with medical history significant for history of recent C. difficile colitis, admission for the same about one month ago, who presented to the hospital with recurrent diarrhea, fever to 101. On arrival to emergency room if labs revealed hyponatremia, acute renal failure, toxigenic C. difficile by PCR was also positive. Patient was initiated on vancomycin orally with no significant improvement of his symptoms, patient continues to have diarrheal stool every 1 hour. He feels weak, exhausted. No more fevers. Could not sleep last night, continues to have bowel movements every 30 minutes to 1 hour. Blood pressure has improved with IV fluids  Review of Systems  Constitutional: Positive for malaise/fatigue. Negative for chills, fever and weight loss.  HENT: Negative for congestion.   Eyes: Negative for blurred vision and double vision.  Respiratory: Negative for cough, sputum production, shortness of breath and wheezing.   Cardiovascular: Negative for chest pain, palpitations, orthopnea, leg swelling and PND.  Gastrointestinal: Positive for diarrhea. Negative for abdominal pain, blood in stool, constipation, nausea and vomiting.  Genitourinary: Negative for dysuria, frequency, hematuria and urgency.  Musculoskeletal: Negative for falls.  Neurological: Negative for dizziness, tremors, focal weakness and headaches.  Endo/Heme/Allergies: Does not bruise/bleed easily.  Psychiatric/Behavioral: Negative for depression. The patient does not have insomnia.     VITAL SIGNS: Blood pressure 116/66, pulse 87, temperature 98.6 F (37 C), temperature source Oral, resp. rate 19, height 5\' 8"  (1.727 m), weight 62.2 kg (137 lb 1.6 oz), SpO2 98  %.  PHYSICAL EXAMINATION:   GENERAL:  75 y.o.-year-old patient lying in the bed with no acute distress, laying in the bed, pale, prostrated.  EYES: Pupils equal, round, reactive to light and accommodation. No scleral icterus. Extraocular muscles intact.  HEENT: Head atraumatic, normocephalic. Oropharynx and nasopharynx clear.  NECK:  Supple, no jugular venous distention. No thyroid enlargement, no tenderness.  LUNGS: Normal breath sounds bilaterally, no wheezing, rales,rhonchi or crepitation. No use of accessory muscles of respiration.  CARDIOVASCULAR: S1, S2 normal. No murmurs, rubs, or gallops.  ABDOMEN: Soft, nontender, nondistended. Bowel sounds present. No organomegaly or mass.  EXTREMITIES: No pedal edema, cyanosis, or clubbing.  NEUROLOGIC: Cranial nerves II through XII are intact. Muscle strength 5/5 in all extremities. Sensation intact. Gait not checked.  PSYCHIATRIC: The patient is alert and oriented x 3.  SKIN: No obvious rash, lesion, or ulcer.   ORDERS/RESULTS REVIEWED:   CBC  Recent Labs Lab 09/25/16 1329 09/26/16 1149 09/27/16 0408 09/28/16 0500  WBC 10.9* 10.0 5.4  --   HGB 12.0* 12.7* 10.5* 10.9*  HCT 35.6* 36.8* 30.8*  --   PLT 325.0 292 243  --   MCV 91.8 91.8 92.7  --   MCH  --  31.7 31.7  --   MCHC 33.8 34.5 34.2  --   RDW 15.1 15.0* 14.7*  --   LYMPHSABS  --  0.8*  --   --   MONOABS  --  1.4*  --   --   EOSABS  --  0.0  --   --   BASOSABS  --  0.1  --   --    ------------------------------------------------------------------------------------------------------------------  Chemistries   Recent Labs Lab 09/25/16 1329 09/26/16 1149 09/27/16 0408 09/28/16 0500  NA 130* 131* 132* 133*  K 4.4 3.8 3.4* 4.5  CL 94* 96* 103 107  CO2 27 25 23  21*  GLUCOSE 118* 118* 111* 113*  BUN 15 25* 19 9  CREATININE 1.07 1.84* 1.24 0.92  CALCIUM 9.3 8.7* 7.9* 7.8*  MG  --   --  1.6* 1.7  AST 24 30  --   --   ALT 16 22  --   --   ALKPHOS 67 58  --   --    BILITOT 1.0 1.4*  --   --    ------------------------------------------------------------------------------------------------------------------ estimated creatinine clearance is 62 mL/min (by C-G formula based on SCr of 0.92 mg/dL). ------------------------------------------------------------------------------------------------------------------ No results for input(s): TSH, T4TOTAL, T3FREE, THYROIDAB in the last 72 hours.  Invalid input(s): FREET3  Cardiac Enzymes No results for input(s): CKMB, TROPONINI, MYOGLOBIN in the last 168 hours.  Invalid input(s): CK ------------------------------------------------------------------------------------------------------------------ Invalid input(s): POCBNP ---------------------------------------------------------------------------------------------------------------  RADIOLOGY: No results found.  EKG:  Orders placed or performed during the hospital encounter of 08/26/16  . ED EKG  . ED EKG    ASSESSMENT AND PLAN:  Active Problems:   C. difficile colitis  #1. Recurrent C. difficile colitis, continue vancomycin orally, Adding Flagyl orally as well, watching for side effects, including nausea appreciate gastroenterologist input, possible stool transplant if antibiotic therapy fails. Continue probiotic.  #2. Hyponatremia, likely dehydration, slightly better sodium level today, continue IV fluids #3. Hypokalemia, supplemented intravenously, magnesium level was also low, supplemented intravenously as well #4. Generalized weakness, get physical therapist involved for recommendations, although patient has severe diarrhea at present #5. Hyperglycemia, hemoglobin A1c is 5.9, no diabetes #6. Acute renal injury, resolved with IV fluid administration, following closely  Management plans discussed with the patient, patient's wife and they are in agreement.   DRUG ALLERGIES: No Known Allergies  CODE STATUS:     Code Status Orders         Start     Ordered   09/26/16 1619  Full code  Continuous     09/26/16 1619    Code Status History    Date Active Date Inactive Code Status Order ID Comments User Context   08/26/2016 11:45 PM 08/28/2016  8:50 AM Full Code [id number]  Harvie Bridge, DO Inpatient    Advance Directive Documentation   Flowsheet Row Most Recent Value  Type of Advance Directive  Living will  Pre-existing out of facility DNR order (yellow form or pink MOST form)  No data  "MOST" Form in Place?  No data      TOTAL TIME TAKING CARE OF THIS PATIENT: 40 minutes.    Theodoro Grist M.D on 09/28/2016 at 1:03 PM  Between 7am to 6pm - Pager - [phone]  After 6pm go to [email or web address] - password [email or web address] [facility]  [place] [facility]  Office  [phone]  CC: Primary care physician; Tommi Rumps, MD

## 2016-09-29 ENCOUNTER — Inpatient Hospital Stay: Payer: Medicare Other

## 2016-09-29 LAB — BASIC METABOLIC PANEL
ANION GAP: 4 — AB (ref 5–15)
BUN: 8 mg/dL (ref 6–20)
CALCIUM: 7.5 mg/dL — AB (ref 8.9–10.3)
CO2: 23 mmol/L (ref 22–32)
Chloride: 107 mmol/L (ref 101–111)
Creatinine, Ser: 0.95 mg/dL (ref 0.61–1.24)
Glucose, Bld: 107 mg/dL — ABNORMAL HIGH (ref 65–99)
POTASSIUM: 3.9 mmol/L (ref 3.5–5.1)
SODIUM: 134 mmol/L — AB (ref 135–145)

## 2016-09-29 MED ORDER — ONDANSETRON HCL 4 MG/2ML IJ SOLN
4.0000 mg | Freq: Four times a day (QID) | INTRAMUSCULAR | Status: DC | PRN
  Administered 2016-09-29: 11:00:00 4 mg via INTRAVENOUS
  Filled 2016-09-29: qty 2

## 2016-09-29 MED ORDER — ONDANSETRON HCL 4 MG PO TABS: 4.0000 mg | ORAL_TABLET | Freq: Four times a day (QID) | ORAL | Status: DC | PRN

## 2016-09-29 NOTE — Consult Note (Signed)
Patient still with loose stools but they are forming into mushy consistency.  I discussed with him and wife briefly that if no improvement we could do a stool transplant.  I would like to wait til Monday to see if he has improvement enough to justify holding off on the FMT.

## 2016-09-29 NOTE — Progress Notes (Signed)
[place] at [place] NAME: Henry Mays    MR#:  [id number]  DATE OF BIRTH:  Jul 03, 1941  SUBJECTIVE:  CHIEF COMPLAINT:   Chief Complaint  Patient presents with  . Diarrhea   He had multiple episodes of diarrhea last night, nauseous earlier since this morning no diarrhea denies abdominal pain    Review of Systems  Constitutional: Positive for malaise/fatigue. Negative for chills, fever and weight loss.  HENT: Negative for congestion.   Eyes: Negative for blurred vision and double vision.  Respiratory: Negative for cough, sputum production, shortness of breath and wheezing.   Cardiovascular: Negative for chest pain, palpitations, orthopnea, leg swelling and PND.  Gastrointestinal: Positive for diarrhea and nausea. Negative for abdominal pain, blood in stool, constipation and vomiting.  Genitourinary: Negative for dysuria, frequency, hematuria and urgency.  Musculoskeletal: Negative for falls.  Neurological: Negative for dizziness, tremors, focal weakness and headaches.  Endo/Heme/Allergies: Does not bruise/bleed easily.  Psychiatric/Behavioral: Negative for depression. The patient does not have insomnia.     VITAL SIGNS: Blood pressure 123/78, pulse 82, temperature 99.6 F (37.6 C), temperature source Oral, resp. rate 18, height 5\' 8"  (1.727 m), weight 137 lb 1.6 oz (62.2 kg), SpO2 99 %.  PHYSICAL EXAMINATION:   GENERAL:  75 y.o.-year-old patient lying in the bed with no acute distress, laying in the bed, pale, prostrated.  EYES: Pupils equal, round, reactive to light and accommodation. No scleral icterus. Extraocular muscles intact.  HEENT: Head atraumatic, normocephalic. Oropharynx and nasopharynx clear.  NECK:  Supple, no jugular venous distention. No thyroid enlargement, no tenderness.  LUNGS: Normal breath sounds bilaterally, no wheezing, rales,rhonchi or crepitation. No use of accessory muscles of respiration.   CARDIOVASCULAR: S1, S2 normal. No murmurs, rubs, or gallops.  ABDOMEN: Soft, mildly distended ,nontender, . Bowel sounds present. No organomegaly or mass.  EXTREMITIES: No pedal edema, cyanosis, or clubbing.  NEUROLOGIC: Cranial nerves II through XII are intact. Muscle strength 5/5 in all extremities. Sensation intact. Gait not checked.  PSYCHIATRIC: The patient is alert and oriented x 3.  SKIN: No obvious rash, lesion, or ulcer.   ORDERS/RESULTS REVIEWED:   CBC  Recent Labs Lab 09/25/16 1329 09/26/16 1149 09/27/16 0408 09/28/16 0500  WBC 10.9* 10.0 5.4  --   HGB 12.0* 12.7* 10.5* 10.9*  HCT 35.6* 36.8* 30.8*  --   PLT 325.0 292 243  --   MCV 91.8 91.8 92.7  --   MCH  --  31.7 31.7  --   MCHC 33.8 34.5 34.2  --   RDW 15.1 15.0* 14.7*  --   LYMPHSABS  --  0.8*  --   --   MONOABS  --  1.4*  --   --   EOSABS  --  0.0  --   --   BASOSABS  --  0.1  --   --    ------------------------------------------------------------------------------------------------------------------  Chemistries   Recent Labs Lab 09/25/16 1329 09/26/16 1149 09/27/16 0408 09/28/16 0500 09/29/16 0410  NA 130* 131* 132* 133* 134*  K 4.4 3.8 3.4* 4.5 3.9  CL 94* 96* 103 107 107  CO2 27 25 23  21* 23  GLUCOSE 118* 118* 111* 113* 107*  BUN 15 25* 19 9 8   CREATININE 1.07 1.84* 1.24 0.92 0.95  CALCIUM 9.3 8.7* 7.9* 7.8* 7.5*  MG  --   --  1.6* 1.7  --   AST 24 30  --   --   --  ALT 16 22  --   --   --   ALKPHOS 67 58  --   --   --   BILITOT 1.0 1.4*  --   --   --    ------------------------------------------------------------------------------------------------------------------ estimated creatinine clearance is 60 mL/min (by C-G formula based on SCr of 0.95 mg/dL). ------------------------------------------------------------------------------------------------------------------ No results for input(s): TSH, T4TOTAL, T3FREE, THYROIDAB in the last 72 hours.  Invalid input(s): FREET3  Cardiac  Enzymes No results for input(s): CKMB, TROPONINI, MYOGLOBIN in the last 168 hours.  Invalid input(s): CK ------------------------------------------------------------------------------------------------------------------ Invalid input(s): POCBNP ---------------------------------------------------------------------------------------------------------------  RADIOLOGY: No results found.  EKG:  Orders placed or performed during the hospital encounter of 08/26/16  . ED EKG  . ED EKG    ASSESSMENT AND PLAN:  Patient is a 75 year old with recurrent C. difficile colitis  #1. Recurrent C. difficile colitis, continue vancomycin orally and Flagyl Patient does have some abdominal distention on examination so order abdominal x-ray If does not improve may need fecal transplantation #2. Hyponatremia, due to dehydration improved with IV fluids #3. Hypokalemia, replaced #4. Generalized weakness, ambulate much as possible  #5. Hyperglycemia, hemoglobin A1c is 5.9, no diabetes #6. Acute renal injury, resolved with IV fluid administration      DRUG ALLERGIES: No Known Allergies  CODE STATUS:     Code Status Orders        Start     Ordered   09/26/16 1619  Full code  Continuous     09/26/16 1619    Code Status History    Date Active Date Inactive Code Status Order ID Comments User Context   08/26/2016 11:45 PM 08/28/2016  8:50 AM Full Code [id number]  Harvie Bridge, DO Inpatient    Advance Directive Documentation   Flowsheet Row Most Recent Value  Type of Advance Directive  Living will  Pre-existing out of facility DNR order (yellow form or pink MOST form)  No data  "MOST" Form in Place?  No data      TOTAL TIME TAKING CARE OF THIS PATIENT: 35 minutes.    [name] M.D on 09/29/2016 at 12:19 PM  Between 7am to 6pm - Pager - [phone]  After 6pm go to [email or web address] - password [email or web address] [facility]  [place] [facility]  Office  [phone]  CC: Primary care  physician; Tommi Rumps, MD

## 2016-09-30 NOTE — Plan of Care (Signed)
Problem: Fluid Volume: Goal: Ability to maintain a balanced intake and output will improve Outcome: Not Progressing Pt continues to have multiple small loose BM's, cdiff +  Problem: Bowel/Gastric: Goal: Will not experience complications related to bowel motility Outcome: Not Progressing Pt continues to have multiple small loose BM's, cdiff +

## 2016-09-30 NOTE — Care Management Important Message (Signed)
Important Message  Patient Details  Name: Henry Mays MRN: [id number] Date of Birth: 1941/07/26   Medicare Important Message Given:  Yes    Carles Collet, RN 09/30/2016, 9:33 AM

## 2016-09-30 NOTE — Progress Notes (Signed)
[facility] NAME: Henry Mays    MR#:  [id number]  DATE OF BIRTH:  1941/02/20  SUBJECTIVE:  CHIEF COMPLAINT:   Chief Complaint  Patient presents with  . Diarrhea   Patient feeling better his diarrhea is improved denies any chest pain or palpitations   Review of Systems  Constitutional: Positive for malaise/fatigue. Negative for chills, fever and weight loss.  HENT: Negative for congestion.   Eyes: Negative for blurred vision and double vision.  Respiratory: Negative for cough, sputum production, shortness of breath and wheezing.   Cardiovascular: Negative for chest pain, palpitations, orthopnea, leg swelling and PND.  Gastrointestinal: Positive for diarrhea and nausea. Negative for abdominal pain, blood in stool, constipation and vomiting.  Genitourinary: Negative for dysuria, frequency, hematuria and urgency.  Musculoskeletal: Negative for falls.  Neurological: Negative for dizziness, tremors, focal weakness and headaches.  Endo/Heme/Allergies: Does not bruise/bleed easily.  Psychiatric/Behavioral: Negative for depression. The patient does not have insomnia.     VITAL SIGNS: Blood pressure 132/63, pulse 67, temperature 98 F (36.7 C), temperature source Oral, resp. rate 19, height 5\' 8"  (1.727 m), weight 137 lb 1.6 oz (62.2 kg), SpO2 99 %.  PHYSICAL EXAMINATION:   GENERAL:  75 y.o.-year-old patient lying in the bed with no acute distress, laying in the bed, pale, prostrated.  EYES: Pupils equal, round, reactive to light and accommodation. No scleral icterus. Extraocular muscles intact.  HEENT: Head atraumatic, normocephalic. Oropharynx and nasopharynx clear.  NECK:  Supple, no jugular venous distention. No thyroid enlargement, no tenderness.  LUNGS: Normal breath sounds bilaterally, no wheezing, rales,rhonchi or crepitation. No use of accessory muscles of respiration.  CARDIOVASCULAR: S1, S2 normal. No murmurs,  rubs, or gallops.  ABDOMEN: Soft, mildly distended ,nontender, . Bowel sounds present. No organomegaly or mass.  EXTREMITIES: No pedal edema, cyanosis, or clubbing.  NEUROLOGIC: Cranial nerves II through XII are intact. Muscle strength 5/5 in all extremities. Sensation intact. Gait not checked.  PSYCHIATRIC: The patient is alert and oriented x 3.  SKIN: No obvious rash, lesion, or ulcer.   ORDERS/RESULTS REVIEWED:   CBC  Recent Labs Lab 09/25/16 1329 09/26/16 1149 09/27/16 0408 09/28/16 0500  WBC 10.9* 10.0 5.4  --   HGB 12.0* 12.7* 10.5* 10.9*  HCT 35.6* 36.8* 30.8*  --   PLT 325.0 292 243  --   MCV 91.8 91.8 92.7  --   MCH  --  31.7 31.7  --   MCHC 33.8 34.5 34.2  --   RDW 15.1 15.0* 14.7*  --   LYMPHSABS  --  0.8*  --   --   MONOABS  --  1.4*  --   --   EOSABS  --  0.0  --   --   BASOSABS  --  0.1  --   --    ------------------------------------------------------------------------------------------------------------------  Chemistries   Recent Labs Lab 09/25/16 1329 09/26/16 1149 09/27/16 0408 09/28/16 0500 09/29/16 0410  NA 130* 131* 132* 133* 134*  K 4.4 3.8 3.4* 4.5 3.9  CL 94* 96* 103 107 107  CO2 27 25 23  21* 23  GLUCOSE 118* 118* 111* 113* 107*  BUN 15 25* 19 9 8   CREATININE 1.07 1.84* 1.24 0.92 0.95  CALCIUM 9.3 8.7* 7.9* 7.8* 7.5*  MG  --   --  1.6* 1.7  --   AST 24 30  --   --   --   ALT 16 22  --   --   --  ALKPHOS 67 58  --   --   --   BILITOT 1.0 1.4*  --   --   --    ------------------------------------------------------------------------------------------------------------------ estimated creatinine clearance is 60 mL/min (by C-G formula based on SCr of 0.95 mg/dL). ------------------------------------------------------------------------------------------------------------------ No results for input(s): TSH, T4TOTAL, T3FREE, THYROIDAB in the last 72 hours.  Invalid input(s): FREET3  Cardiac Enzymes No results for input(s): CKMB,  TROPONINI, MYOGLOBIN in the last 168 hours.  Invalid input(s): CK ------------------------------------------------------------------------------------------------------------------ Invalid input(s): POCBNP ---------------------------------------------------------------------------------------------------------------  RADIOLOGY: Dg Abd 2 Views  Result Date: 09/29/2016 CLINICAL DATA:  Abdominal pain, diarrhea EXAM: ABDOMEN - 2 VIEW COMPARISON:  None. FINDINGS: Mild gaseous distended small bowel loops in mid abdomen suspicious for ileus, enteritis or early bowel obstruction. Moderate gas noted transverse colon. No free abdominal air. IMPRESSION: Mild gaseous distended small bowel loops mid abdomen suspicious for ileus, enteritis or early bowel obstruction. Moderate gas noted transverse colon. No evidence of free abdominal air. Electronically Signed   By: Lahoma Crocker M.D.   On: 09/29/2016 12:29    EKG:  Orders placed or performed during the hospital encounter of 08/26/16  . ED EKG  . ED EKG    ASSESSMENT AND PLAN:  Patient is a 75 year old with recurrent C. difficile colitis  #1. Recurrent C. difficile colitis, continue vancomycin orally and Flagyl Symptoms improve. Patient had abdominal x-ray with dilated small bowel I recommended him to ambulate.  #2. Hyponatremia, due to dehydration improved with IV fluids #3. Hypokalemia, replaced #4. Generalized weakness, ambulate much as possible  #5. Hyperglycemia, hemoglobin A1c is 5.9, no diabetes #6. Acute renal injury, resolved with IV fluid administration    Anticipate discharge in the next 1-2 days  DRUG ALLERGIES: No Known Allergies  CODE STATUS:     Code Status Orders        Start     Ordered   09/26/16 1619  Full code  Continuous     09/26/16 1619    Code Status History    Date Active Date Inactive Code Status Order ID Comments User Context   08/26/2016 11:45 PM 08/28/2016  8:50 AM Full Code [id number]  Harvie Bridge, DO  Inpatient    Advance Directive Documentation   Flowsheet Row Most Recent Value  Type of Advance Directive  Living will  Pre-existing out of facility DNR order (yellow form or pink MOST form)  No data  "MOST" Form in Place?  No data      TOTAL TIME TAKING CARE OF THIS PATIENT: 32 minutes.    [name] M.D on 09/30/2016 at 12:03 PM  Between 7am to 6pm - Pager - [phone]  After 6pm go to [email or web address] - password [email or web address] [facility]  [facility]  Office  [phone]  CC: Primary care physician; Tommi Rumps, MD

## 2016-09-30 NOTE — Consult Note (Signed)
Patient improving stools better, forming up, VSS abd non tender.  Likely can go home tomorrow.  Needs instructions on wiping down surfaces in home.  Follow  Up my office in 1-2 weeks.  Should get 10 days of vancomycin for outpatient use.

## 2016-10-01 ENCOUNTER — Telehealth: Payer: Self-pay

## 2016-10-01 MED ORDER — VANCOMYCIN 50 MG/ML ORAL SOLUTION: 250.0000 mg | Freq: Four times a day (QID) | ORAL | 0 refills | Status: AC

## 2016-10-01 MED ORDER — METRONIDAZOLE 500 MG PO TABS: 500.0000 mg | ORAL_TABLET | Freq: Three times a day (TID) | ORAL | 0 refills | Status: AC

## 2016-10-01 NOTE — Progress Notes (Signed)
Discussed discharge instructions and medications with pt and his wife. IV removed. All questions addressed. Pt transported home via car by his wife. 

## 2016-10-01 NOTE — Telephone Encounter (Signed)
Transition Care Management Follow-up Telephone Call  How have you been since you were released from the hospital? Pretty good, feels better.    Do you understand why you were in the hospital?  Yes, no questions   Do you understand the discharge instrcutions?  Yes, no questions  Items Reviewed:  Medications reviewed: two antibiotics and probiotics  Allergies reviewed: no changes  Dietary changes reviewed: encouraged yogurt per the nursing staff.  Referrals reviewed: just following up with PCP.  Functional Questionnaire:   Activities of Daily Living (ADLs):   He states they are independent in the following: all activities States they require assistance with the following: nothing   Any transportation issues/concerns?: no concerns   Any patient concerns? Husband wants to talk to PCP about this happening again, so wants to plan ahead  Confirmed importance and date/time of follow-up visits scheduled: yes, November 1st.    Confirmed with patient if condition begins to worsen call PCP or go to the ER.  Patient was given the Call-a-Nurse line [phone]: yes.

## 2016-10-01 NOTE — Discharge Instructions (Signed)

## 2016-10-01 NOTE — Discharge Summary (Signed)
Henry Mays, 75 y.o., DOB 09-26-1941, MRN [id number]. Admission date: 09/26/2016 Discharge Date 10/01/2016 Primary MD Tommi Rumps, MD Admitting Physician Loletha Grayer, MD  Admission Diagnosis  Dehydration [E86.0] Acute renal insufficiency [N28.9] C. difficile diarrhea [A04.72]  Discharge Diagnosis   Active Problems:   C. difficile colitis   Hyponatremia   Hypokalemia   generalized weakness   Hyperglycemia reactive   ARF resolved        Henry Mays  is a 75 y.o. male with a known history of Recent C. difficile colitis. Patient was hospitalized last month for  C. difficile colitis. Patient was seen in the emergency room again with diarrhea. He was diagnosed with C. difficile again and was started on vancomycin. Due to persistent symptoms she was also started on Flagyl. Patient currently is being treated with both vancomycin and Flagyl his diarrhea has subsided. He was seen in consultation by GI. Patient also had hyponatremia hypokalemia acute renal failure due to dehydration that is all resolved.           Consults  GI  Significant Tests:  See full reports for all details     Dg Chest Port 1 View  Result Date: 09/01/2016 CLINICAL DATA:  Possible food aspiration today. EXAM: PORTABLE CHEST 1 VIEW COMPARISON:  08/31/2016. FINDINGS: The cardiac silhouette remains borderline enlarged. Aortic arch calcifications. Stable mild prominence of the interstitial markings and mild linear scarring at the left lung base. Right shoulder degenerative changes and probable old, healed proximal right humerus fracture. IMPRESSION: No acute abnormality. Electronically Signed   By: Claudie Revering M.D.   On: 09/01/2016 15:18   Dg Abd 2 Views  Result Date: 09/29/2016 CLINICAL DATA:  Abdominal pain, diarrhea EXAM: ABDOMEN - 2 VIEW COMPARISON:  None. FINDINGS: Mild gaseous distended small bowel loops in mid abdomen suspicious for ileus, enteritis or early bowel obstruction.  Moderate gas noted transverse colon. No free abdominal air. IMPRESSION: Mild gaseous distended small bowel loops mid abdomen suspicious for ileus, enteritis or early bowel obstruction. Moderate gas noted transverse colon. No evidence of free abdominal air. Electronically Signed   By: Lahoma Crocker M.D.   On: 09/29/2016 12:29       Today   Subjective:   Henry Mays  patient feeling better denies any nausea vomiting or diarrhea  Objective:   Blood pressure 118/71, pulse 81, temperature 98.2 F (36.8 C), temperature source Oral, resp. rate 20, height 5\' 8"  (1.727 m), weight 137 lb 1.6 oz (62.2 kg), SpO2 100 %.  .  Intake/Output Summary (Last 24 hours) at 10/01/16 1311 Last data filed at 10/01/16 0800  Gross per 24 hour  Intake                0 ml  Output              100 ml  Net             -100 ml    Exam VITAL SIGNS: Blood pressure 118/71, pulse 81, temperature 98.2 F (36.8 C), temperature source Oral, resp. rate 20, height 5\' 8"  (1.727 m), weight 137 lb 1.6 oz (62.2 kg), SpO2 100 %.  GENERAL:  75 y.o.-year-old patient lying in the bed with no acute distress.  EYES: Pupils equal, round, reactive to light and accommodation. No scleral icterus. Extraocular muscles intact.  HEENT: Head atraumatic, normocephalic. Oropharynx and nasopharynx clear.  NECK:  Supple, no jugular venous distention. No thyroid enlargement, no tenderness.  LUNGS: Normal breath sounds  bilaterally, no wheezing, rales,rhonchi or crepitation. No use of accessory muscles of respiration.  CARDIOVASCULAR: S1, S2 normal. No murmurs, rubs, or gallops.  ABDOMEN: Soft, nontender, nondistended. Bowel sounds present. No organomegaly or mass.  EXTREMITIES: No pedal edema, cyanosis, or clubbing.  NEUROLOGIC: Cranial nerves II through XII are intact. Muscle strength 5/5 in all extremities. Sensation intact. Gait not checked.  PSYCHIATRIC: The patient is alert and oriented x 3.  SKIN: No obvious rash, lesion, or ulcer.    Data Review     CBC w Diff: Lab Results  Component Value Date   WBC 5.4 09/27/2016   HGB 10.9 (L) 09/28/2016   HCT 30.8 (L) 09/27/2016   PLT 243 09/27/2016   LYMPHOPCT 8 09/26/2016   MONOPCT 14 09/26/2016   EOSPCT 0 09/26/2016   BASOPCT 1 09/26/2016   CMP: Lab Results  Component Value Date   NA 134 (L) 09/29/2016   K 3.9 09/29/2016   CL 107 09/29/2016   CO2 23 09/29/2016   BUN 8 09/29/2016   CREATININE 0.95 09/29/2016   PROT 8.5 (H) 09/26/2016   ALBUMIN 3.3 (L) 09/26/2016   BILITOT 1.4 (H) 09/26/2016   ALKPHOS 58 09/26/2016   AST 30 09/26/2016   ALT 22 09/26/2016  .  Micro Results Recent Results (from the past 240 hour(s))  C difficile quick scan w PCR reflex     Status: Abnormal   Collection Time: 09/26/16 11:50 AM  Result Value Ref Range Status   C Diff antigen POSITIVE (A) NEGATIVE Final   C Diff toxin NEGATIVE NEGATIVE Final   C Diff interpretation Results are indeterminate. See PCR results.  Final  Gastrointestinal Panel by PCR , Stool     Status: None   Collection Time: 09/26/16 11:50 AM  Result Value Ref Range Status   Campylobacter species NOT DETECTED NOT DETECTED Final   Plesimonas shigelloides NOT DETECTED NOT DETECTED Final   Salmonella species NOT DETECTED NOT DETECTED Final   Yersinia enterocolitica NOT DETECTED NOT DETECTED Final   Vibrio species NOT DETECTED NOT DETECTED Final   Vibrio cholerae NOT DETECTED NOT DETECTED Final   Enteroaggregative E coli (EAEC) NOT DETECTED NOT DETECTED Final   Enteropathogenic E coli (EPEC) NOT DETECTED NOT DETECTED Final   Enterotoxigenic E coli (ETEC) NOT DETECTED NOT DETECTED Final   Shiga like toxin producing E coli (STEC) NOT DETECTED NOT DETECTED Final   Shigella/Enteroinvasive E coli (EIEC) NOT DETECTED NOT DETECTED Final   Cryptosporidium NOT DETECTED NOT DETECTED Final   Cyclospora cayetanensis NOT DETECTED NOT DETECTED Final   Entamoeba histolytica NOT DETECTED NOT DETECTED Final   Giardia lamblia  NOT DETECTED NOT DETECTED Final   Adenovirus F40/41 NOT DETECTED NOT DETECTED Final   Astrovirus NOT DETECTED NOT DETECTED Final   Norovirus GI/GII NOT DETECTED NOT DETECTED Final   Rotavirus A NOT DETECTED NOT DETECTED Final   Sapovirus (I, II, IV, and V) NOT DETECTED NOT DETECTED Final  Clostridium Difficile by PCR     Status: Abnormal   Collection Time: 09/26/16 11:50 AM  Result Value Ref Range Status   Toxigenic C Difficile by pcr POSITIVE (A) NEGATIVE Final    Comment: Positive for toxigenic C. difficile with little to no toxin production. Only treat if clinical presentation suggests symptomatic illness.        Code Status Orders        Start     Ordered   09/26/16 1619  Full code  Continuous     09/26/16  82    Code Status History    Date Active Date Inactive Code Status Order ID Comments User Context   08/26/2016 11:45 PM 08/28/2016  8:50 AM Full Code [id number]  Harvie Bridge, DO Inpatient    Advance Directive Documentation   Flowsheet Row Most Recent Value  Type of Advance Directive  Living will  Pre-existing out of facility DNR order (yellow form or pink MOST form)  No data  "MOST" Form in Place?  No data          Follow-up Information    Tommi Rumps, MD Follow up in 7 day(s).   Specialty:  Family Medicine Contact information: [address] [place] [address] [phone]           Discharge Medications     Medication List    TAKE these medications   lisinopril 5 MG tablet Commonly known as:  PRINIVIL,ZESTRIL TAKE 1 TABLET BY MOUTH DAILY   metroNIDAZOLE 500 MG tablet Commonly known as:  FLAGYL Take 1 tablet (500 mg total) by mouth every 8 (eight) hours.   vancomycin 50 mg/mL oral solution Commonly known as:  VANCOCIN Take 5 mLs (250 mg total) by mouth every 6 (six) hours.          Total Time in preparing paper work, data evaluation and todays exam - 35 minutes  [name] M.D on 10/01/2016 at 1:11  PM  [facility]   Office  [phone]

## 2016-10-03 NOTE — Discharge Summary (Signed)
[facility] [place] NAME: Henry Mays    MR#:  [id number]  DATE OF BIRTH:  25-Sep-1941  DATE OF ADMISSION:  08/26/2016   ADMITTING PHYSICIAN: Harvie Bridge, DO  DATE OF DISCHARGE: 09/04/2016 12:50 PM  PRIMARY CARE PHYSICIAN: Tommi Rumps, MD   ADMISSION DIAGNOSIS:  Near syncope [R55] Minor head injury, initial encounter [S00.90XA] Scalp laceration, initial encounter [S01.01XA] Diarrhea, unspecified type [R19.7] DISCHARGE DIAGNOSIS:  Active Problems:   Orthostatic hypotension   PVC (premature ventricular contraction)   C. difficile diarrhea   Minor head injury   Near syncope  SECONDARY DIAGNOSIS:   Past Medical History:  Diagnosis Date  . Arthritis   . Basal cell carcinoma   . Chickenpox   . Hypertension   . Kidney stones    HOSPITAL COURSE:   This is a 75 year old male with known history of hypertension, basal cell carcinoma and arthritis who was admitted on 08/27/2016 with a diagnosis of intractable diarrhea, presumed infectious origin as well as near syncope and hyponatremia. He was admitted for IV fluid hydration, stool studies, correction of electrolytes. Shortly after admission he was found to be positive for C. difficile was started on Vanco and Flagyl. Patient had no sick contacts, no recent antibiotics and no healthcare exposure. During the course of the hospitalization he had continued diarrhea and therefore his antibiotics were changed to vancomycin. He was seen by infectious disease who agreed with oral Vanco and recommended the addition of Flagyl if diarrhea worsens. IV Flagyl was ultimately added and diarrhea began to improve. Hyponatremia was deemed to be secondary to dehydration and diarrhea and sodium was corrected with IV normal saline. Cardiology was consulted during his hospitalization secondary to near syncope and frequent PVCs. Echocardiogram was normal and no further ischemic workup was recommended.  Lopressor was started for continued PVCs.  During his hospital a developed a cough which was evaluated with a chest x-ray which was negative and speech and swallow evaluation per patient was found to have oral thrush and was continued on his previously prescribed dexamethasone mouthwash. Patient was evaluated by physical therapy who recommended home with PT twice a week.  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  The remainder of the patient's hospital course was unremarkable. On the day of discharge she was medically stable for discharge to home with follow-up with his outpatient primary care provider. Discharge medications are as per the medication reconciliation  DISCHARGE CONDITIONS:  Stable CONSULTS OBTAINED:  Treatment Team:  Leonel Ramsay, MD DRUG ALLERGIES:  No Known Allergies DISCHARGE MEDICATIONS:     Medication List    STOP taking these medications   chlorproMAZINE 25 MG tablet Commonly known as:  THORAZINE   ciprofloxacin 500 MG tablet Commonly known as:  CIPRO     TAKE these medications   lisinopril 5 MG tablet Commonly known as:  PRINIVIL,ZESTRIL TAKE 1 TABLET BY MOUTH DAILY     ASK your doctor about these medications   vancomycin 50 mg/mL oral solution Commonly known as:  VANCOCIN Take 2.5 mLs (125 mg total) by mouth every 6 (six) hours. Ask about: Should I take this medication?        DISCHARGE INSTRUCTIONS:   DIET:  Cardiac diet DISCHARGE CONDITION:  Stable ACTIVITY:  Activity as tolerated OXYGEN:  Home Oxygen: No.  Oxygen Delivery: room air DISCHARGE LOCATION:  home   If you experience worsening of  your admission symptoms, develop shortness of breath, life threatening emergency, suicidal or homicidal thoughts you must seek medical attention immediately by calling 911 or calling your MD immediately  if symptoms less severe.  You Must read complete instructions/literature along with all the  possible adverse reactions/side effects for all the Medicines you take and that have been prescribed to you. Take any new Medicines after you have completely understood and accpet all the possible adverse reactions/side effects.   Please note  You were cared for by a hospitalist during your hospital stay. If you have any questions about your discharge medications or the care you received while you were in the hospital after you are discharged, you can call the unit and asked to speak with the hospitalist on call if the hospitalist that took care of you is not available. Once you are discharged, your primary care physician will handle any further medical issues. Please note that NO REFILLS for any discharge medications will be authorized once you are discharged, as it is imperative that you return to your primary care physician (or establish a relationship with a primary care physician if you do not have one) for your aftercare needs so that they can reassess your need for medications and monitor your lab values.    On the day of Discharge:  VITAL SIGNS:  Blood pressure 138/75, pulse 75, temperature 97.7 F (36.5 C), temperature source Oral, resp. rate 18, height 5\' 8"  (1.727 m), weight 68 kg (150 lb), SpO2 100 %. PHYSICAL EXAMINATION:  GENERAL:  75 y.o.-year-old patient lying in the bed with no acute distress.  EYES: Pupils equal, round, reactive to light and accommodation. No scleral icterus. Extraocular muscles intact.  HEENT: Head atraumatic, normocephalic. Oropharynx and nasopharynx clear.  NECK:  Supple, no jugular venous distention. No thyroid enlargement, no tenderness.  LUNGS: Normal breath sounds bilaterally, no wheezing, rales,rhonchi or crepitation. No use of accessory muscles of respiration.  CARDIOVASCULAR: S1, S2 normal. No murmurs, rubs, or gallops.  ABDOMEN: Soft, non-tender, non-distended. Bowel sounds present. No organomegaly or mass.  EXTREMITIES: No pedal edema, cyanosis, or  clubbing.  NEUROLOGIC: Cranial nerves II through XII are intact. Muscle strength 5/5 in all extremities. Sensation intact. Gait not checked.  PSYCHIATRIC: The patient is alert and oriented x 3.  SKIN: No obvious rash, lesion, or ulcer.  DATA REVIEW:   CBC  Recent Labs Lab 09/27/16 0408 09/28/16 0500  WBC 5.4  --   HGB 10.5* 10.9*  HCT 30.8*  --   PLT 243  --     Chemistries   Recent Labs Lab 09/28/16 0500 09/29/16 0410  NA 133* 134*  K 4.5 3.9  CL 107 107  CO2 21* 23  GLUCOSE 113* 107*  BUN 9 8  CREATININE 0.92 0.95  CALCIUM 7.8* 7.5*  MG 1.7  --      Microbiology Results  Results for orders placed or performed during the hospital encounter of 08/26/16  Gastrointestinal Panel by PCR , Stool     Status: None   Collection Time: 08/26/16  8:41 PM  Result Value Ref Range Status   Campylobacter species NOT DETECTED NOT DETECTED Final   Plesimonas shigelloides NOT DETECTED NOT DETECTED Final   Salmonella species NOT DETECTED NOT DETECTED Final   Yersinia enterocolitica NOT DETECTED NOT DETECTED Final   Vibrio species NOT DETECTED NOT DETECTED Final   Vibrio cholerae NOT DETECTED NOT DETECTED Final   Enteroaggregative E coli (EAEC) NOT DETECTED NOT DETECTED Final  Enteropathogenic E coli (EPEC) NOT DETECTED NOT DETECTED Final   Enterotoxigenic E coli (ETEC) NOT DETECTED NOT DETECTED Final   Shiga like toxin producing E coli (STEC) NOT DETECTED NOT DETECTED Final   E. coli O157 NOT DETECTED NOT DETECTED Final   Shigella/Enteroinvasive E coli (EIEC) NOT DETECTED NOT DETECTED Final   Cryptosporidium NOT DETECTED NOT DETECTED Final   Cyclospora cayetanensis NOT DETECTED NOT DETECTED Final   Entamoeba histolytica NOT DETECTED NOT DETECTED Final   Giardia lamblia NOT DETECTED NOT DETECTED Final   Adenovirus F40/41 NOT DETECTED NOT DETECTED Final   Astrovirus NOT DETECTED NOT DETECTED Final   Norovirus GI/GII NOT DETECTED NOT DETECTED Final   Rotavirus A NOT DETECTED  NOT DETECTED Final   Sapovirus (I, II, IV, and V) NOT DETECTED NOT DETECTED Final  C difficile quick scan w PCR reflex     Status: Abnormal   Collection Time: 08/26/16  8:41 PM  Result Value Ref Range Status   C Diff antigen POSITIVE (A) NEGATIVE Final   C Diff toxin POSITIVE (A) NEGATIVE Final   C Diff interpretation Toxin producing C. difficile detected.  Final    Comment: CRITICAL RESULT CALLED TO, READ BACK BY AND VERIFIED WITH:  JERRIE THOMPSON AT 2145 08/26/16 SDR   Urine culture     Status: None   Collection Time: 08/26/16  8:41 PM  Result Value Ref Range Status   Specimen Description URINE, CLEAN CATCH  Final   Special Requests Normal  Final   Culture NO GROWTH Performed at [facility]   Final   Report Status 08/28/2016 FINAL  Final  CULTURE, BLOOD (ROUTINE X 2) w Reflex to ID Panel     Status: None   Collection Time: 08/30/16 10:22 AM  Result Value Ref Range Status   Specimen Description BLOOD LEFT FA  Final   Special Requests   Final    BOTTLES DRAWN AEROBIC AND ANAEROBIC AER 3ML ANA 2ML   Culture NO GROWTH 5 DAYS  Final   Report Status 09/04/2016 FINAL  Final  CULTURE, BLOOD (ROUTINE X 2) w Reflex to ID Panel     Status: None   Collection Time: 08/30/16 10:22 AM  Result Value Ref Range Status   Specimen Description BLOOD LEFT AC  Final   Special Requests BOTTLES DRAWN AEROBIC AND ANAEROBIC 2ML  Final   Culture NO GROWTH 5 DAYS  Final   Report Status 09/04/2016 FINAL  Final  Culture, expectorated sputum-assessment     Status: None   Collection Time: 08/31/16  6:15 PM  Result Value Ref Range Status   Specimen Description EXPECTORATED SPUTUM  Final   Special Requests Normal  Final   Sputum evaluation   Final    Sputum specimen not acceptable for testing.  Please recollect.   SPOKE TO ERWIN MACROHON 08/31/16 @ 2031  [place]    Report Status 08/31/2016 FINAL  Final    RADIOLOGY:  No results found.   Management plans discussed with the patient, family and  they are in agreement.  CODE STATUS:  Code Status History    Date Active Date Inactive Code Status Order ID Comments User Context   09/26/2016  4:19 PM 10/01/2016  7:42 PM Full Code [id number]  Loletha Grayer, MD ED   08/26/2016 11:45 PM 08/28/2016  8:50 AM Full Code [id number]  Harvie Bridge, DO Inpatient    Advance Directive Documentation   Flowsheet Row Most Recent Value  Type of Advance Directive  Living will  Pre-existing out of facility DNR order (yellow form or pink MOST form)  No data  "MOST" Form in Place?  No data      TOTAL TIME TAKING CARE OF THIS PATIENT: 40 minutes.    Harvie Bridge M.D on 10/03/2016 at 10:54 PM  After 6pm go to [email or web address] - Proofreader  [facility]  Office  [phone]  CC: Primary care physician; Tommi Rumps, MD   Note: This dictation was prepared with Dragon dictation along with smaller phrase technology. Any transcriptional errors that result from this process are unintentional.

## 2016-10-04 DIAGNOSIS — D044 Carcinoma in situ of skin of scalp and neck: Secondary | ICD-10-CM | POA: Diagnosis not present

## 2016-10-10 ENCOUNTER — Encounter: Payer: Self-pay | Admitting: Family Medicine

## 2016-10-10 ENCOUNTER — Ambulatory Visit: Payer: Medicare Other | Admitting: Family Medicine

## 2016-10-10 ENCOUNTER — Ambulatory Visit (INDEPENDENT_AMBULATORY_CARE_PROVIDER_SITE_OTHER): Payer: Medicare Other | Admitting: Family Medicine

## 2016-10-10 VITALS — BP 126/80 | HR 79 | Temp 97.7°F | Resp 14 | Wt 145.0 lb

## 2016-10-10 DIAGNOSIS — E871 Hypo-osmolality and hyponatremia: Secondary | ICD-10-CM | POA: Diagnosis not present

## 2016-10-10 DIAGNOSIS — Z09 Encounter for follow-up examination after completed treatment for conditions other than malignant neoplasm: Secondary | ICD-10-CM

## 2016-10-10 DIAGNOSIS — Z8619 Personal history of other infectious and parasitic diseases: Secondary | ICD-10-CM

## 2016-10-10 DIAGNOSIS — A0472 Enterocolitis due to Clostridium difficile, not specified as recurrent: Secondary | ICD-10-CM

## 2016-10-10 DIAGNOSIS — R5383 Other fatigue: Secondary | ICD-10-CM

## 2016-10-10 LAB — BASIC METABOLIC PANEL
BUN: 8 mg/dL (ref 6–23)
CHLORIDE: 99 meq/L (ref 96–112)
CO2: 27 meq/L (ref 19–32)
CREATININE: 0.84 mg/dL (ref 0.40–1.50)
Calcium: 8.7 mg/dL (ref 8.4–10.5)
GFR: 94.69 mL/min (ref >60.00)
GLUCOSE: 119 mg/dL — AB (ref 70–99)
POTASSIUM: 3.9 meq/L (ref 3.5–5.1)
Sodium: 135 mEq/L (ref 135–145)

## 2016-10-10 NOTE — Assessment & Plan Note (Addendum)
Diarrhea has resolved. He finishes his antibiotics tomorrow. Weakness likely related to his extensive hospital stays recently. He will continue physical therapy for this. He will continue to exercise. We'll check a BMP today. If he has any recurrence of symptoms he will follow-up immediately. Medications reviewed with patient.

## 2016-10-10 NOTE — Progress Notes (Signed)
  Tommi Rumps, MD Phone: [phone]  Henry Mays is a 75 y.o. male who presents today for hospital follow-up.  Patient recently discharged from the hospital for his second bout of C. difficile. He was discharged on vancomycin and Flagyl. Notes his last dose of these medications will be tomorrow. He notes his bowel movements have become normal. No diarrhea, abdominal pain, or fevers. Notes his appetite is improved. He is keeping up on his fluid intake. Still notes a little weakness after his 2 hospitalizations. Has to be careful when he walks. No numbness or weakness. Is doing more walking now. Also has physical therapy exercises to complete. Currently taking lisinopril.  PMH: Former smoker   ROS see history of present illness  Objective  Physical Exam Vitals:   10/10/16 1117  BP: 126/80  Pulse: 79  Resp: 14  Temp: 97.7 F (36.5 C)    BP Readings from Last 3 Encounters:  10/10/16 126/80  10/01/16 124/73  09/25/16 106/62   Wt Readings from Last 3 Encounters:  10/10/16 145 lb (65.8 kg)  09/26/16 137 lb 1.6 oz (62.2 kg)  09/25/16 143 lb (64.9 kg)    Physical Exam  Constitutional: No distress.  HENT:  Head: Normocephalic and atraumatic.  Mouth/Throat: Oropharynx is clear and moist. No oropharyngeal exudate.  Eyes: Conjunctivae are normal. Pupils are equal, round, and reactive to light.  Cardiovascular: Normal rate, regular rhythm and normal heart sounds.   Pulmonary/Chest: Effort normal and breath sounds normal.  Abdominal: Soft. Bowel sounds are normal. He exhibits no distension. There is no tenderness. There is no rebound and no guarding.  Musculoskeletal: He exhibits no edema.  Neurological: He is alert.  CN 2-12 intact, 5/5 strength in bilateral biceps, triceps, grip, quads, hamstrings, plantar and dorsiflexion, sensation to light touch intact in bilateral UE and LE, normal gait, absent patellar reflexes  Skin: Skin is warm and dry. He is not diaphoretic.      Assessment/Plan: Please see individual problem list.  C. difficile diarrhea Diarrhea has resolved. He finishes his antibiotics tomorrow. Weakness likely related to his extensive hospital stays recently. He will continue physical therapy for this. He will continue to exercise. We'll check a BMP today. If he has any recurrence of symptoms he will follow-up immediately. Medications reviewed with patient.   Orders Placed This Encounter  Procedures  . Basic metabolic panel    Tommi Rumps, MD [place]

## 2016-10-10 NOTE — Patient Instructions (Signed)
Nice to see you. I am glad you are feeling better. Please finish your antibiotics. Please continue to exercise and work on your physical therapy exercises. If you develop recurrence of diarrhea, fevers, abdominal pain, or any new or changing symptoms please seek medical attention.

## 2016-10-15 ENCOUNTER — Other Ambulatory Visit: Payer: Self-pay | Admitting: Family Medicine

## 2016-10-15 NOTE — Telephone Encounter (Signed)
Please advise on refill.

## 2016-10-16 NOTE — Telephone Encounter (Signed)
Please see why he takes this medication and if he actually requested a refill. Thanks.

## 2016-10-19 NOTE — Telephone Encounter (Signed)
Spoke with patient and he did not request this medication. He stated that he takes this medication for sores in the mouth.  Patient also said that he has been having some balance issues. He has an appointment on Tuesday. I advised him that he should be seen before Tuesday and could go to a [facility] clinic or ED if gets worse. He stated that he wants to wait and see Dr. Caryl Bis on Tuesday. He said that when he bent over walking the dog that he went down on one knee. I again said that he needs to be seen, but patient wants to wait until Tuesday. If it gets worse will be seen over the weekend

## 2016-10-19 NOTE — Telephone Encounter (Signed)
Noted. Called and spoke with patient regarding prior message. He reports he bent over to pick up his dogs bowel movement this morning. He felt as though he was losing his balance and he took a knee on the ground. He then went to stand up and lost his balance and fell into the grass. Notes he bruised his left elbow though had no other injury. No head injury. No loss of consciousness. No numbness or weakness. No lightheadedness. No other symptoms. He just felt as though he lost his balance. I discussed that it would be okay for him to be evaluated next week for this unless he develops any new symptoms. If he develops any new symptoms or has further balance issues he will seek medical attention over the weekend.

## 2016-10-22 ENCOUNTER — Ambulatory Visit: Payer: Medicare Other | Admitting: Family Medicine

## 2016-10-23 ENCOUNTER — Telehealth: Payer: Self-pay | Admitting: Family Medicine

## 2016-10-23 ENCOUNTER — Ambulatory Visit: Payer: Medicare Other | Admitting: Family Medicine

## 2016-10-23 ENCOUNTER — Ambulatory Visit (INDEPENDENT_AMBULATORY_CARE_PROVIDER_SITE_OTHER): Payer: Medicare Other

## 2016-10-23 DIAGNOSIS — Z23 Encounter for immunization: Secondary | ICD-10-CM

## 2016-10-23 NOTE — Telephone Encounter (Signed)
Scheduled patient for follow up on 10/24/16

## 2016-10-23 NOTE — Telephone Encounter (Signed)
Pt called and stated that he spoke with Dr. Caryl Bis on Friday due to a fall. Pt told Dr. Caryl Bis that he had and appt for today 11/14, appt was cancelled on 11/1 when he came in and saw him then. Pt said that Dr. Caryl Bis wanted to see him. Please advise, thank you!  Call pt @ [phone]

## 2016-10-24 ENCOUNTER — Other Ambulatory Visit: Payer: Self-pay | Admitting: Family Medicine

## 2016-10-24 ENCOUNTER — Encounter: Payer: Self-pay | Admitting: Family Medicine

## 2016-10-24 ENCOUNTER — Ambulatory Visit (INDEPENDENT_AMBULATORY_CARE_PROVIDER_SITE_OTHER): Payer: Medicare Other | Admitting: Family Medicine

## 2016-10-24 DIAGNOSIS — I10 Essential (primary) hypertension: Secondary | ICD-10-CM | POA: Diagnosis not present

## 2016-10-24 DIAGNOSIS — W19XXXA Unspecified fall, initial encounter: Secondary | ICD-10-CM | POA: Insufficient documentation

## 2016-10-24 NOTE — Assessment & Plan Note (Signed)
At goal. Continue current medication. 

## 2016-10-24 NOTE — Patient Instructions (Signed)
Nice to see you. Please do the exercises that physical therapy has provided to you. If you develop increasing issues with balance or falls please let us know immediately. If you have a fall and injure yourself please let us know. If he fallen injuring her head you should be evaluated. Please monitor the skin tear on your left elbow. If you develop redness, warmth, or drainage from this please seek medical attention.

## 2016-10-24 NOTE — Progress Notes (Signed)
  Tommi Rumps, MD Phone: [phone]  Henry Mays is a 75 y.o. male who presents today for follow-up.  Patient notes since getting out of the hospital he has had some balance issues. Notes he feels somewhat unsteady when he goes from the street up a curb. Notes on one occasion he was down on one knee and then tried to get up and felt imbalanced and subsequently squatted back down again and tried to stand up and fell over in the grass. He did have an abrasion to his left elbow. No other injury. No head injury. No loss of consciousness. Notes no vertigo, lightheadedness, numbness, or weakness. Does note his legs occasionally feel more tired than they used to. He was in the hospital for at least 2 weeks total over the last 1-2 months. Has worked with physical therapy though has not been doing the exercises that they provided consistently.  HYPERTENSION  Disease Monitoring  Home BP Monitoring not checking Chest pain- no    Dyspnea- no Medications  Compliance-  taking lisinopril. Lightheadedness-  no  Edema- no  ROS see history of present illness  Objective  Physical Exam Vitals:   10/24/16 1113  BP: 118/66  Pulse: 78  Temp: 98.2 F (36.8 C)    BP Readings from Last 3 Encounters:  10/24/16 118/66  10/10/16 126/80  10/01/16 124/73   Wt Readings from Last 3 Encounters:  10/24/16 145 lb 8 oz (66 kg)  10/10/16 145 lb (65.8 kg)  09/26/16 137 lb 1.6 oz (62.2 kg)    Physical Exam  Constitutional: No distress.  Cardiovascular: Normal rate, regular rhythm and normal heart sounds.   Pulmonary/Chest: Effort normal and breath sounds normal.  Musculoskeletal: He exhibits no edema.  Neurological: He is alert.  CN 2-12 intact, 5/5 strength in bilateral biceps, triceps, grip, hip flexors, quads, hamstrings, plantar and dorsiflexion, sensation to light touch intact in bilateral UE and LE, normal gait, some mild imbalance on stepping up onto the exam table  Skin: Skin is warm and dry. He  is not diaphoretic.  Skin tear and abrasion with bruising noted on left lateral elbow, this area is nontender, healing well, no drainage, no warmth, no erythema, able to flex and extend the elbow fully     Assessment/Plan: Please see individual problem list.  Fall Patient with recent fall and some balance issues when stepping up onto curbs. He is neurologically intact. He is otherwise asymptomatic with this. He did have injury to his left elbow though the lesion appears to be well-healing at this time. No head injury or loss of consciousness. Suspect this is related to deconditioning from his recent hospitalizations. Offered to have him work with physical therapy again though he wants to do the exercises at home with his wife around to see if this will benefit him. He will follow-up in 3 weeks for reevaluation and if not improving would consider physical therapy evaluation. Given return precautions.  Essential hypertension At goal. Continue current medication.   Tommi Rumps, MD [place]

## 2016-10-24 NOTE — Assessment & Plan Note (Signed)
Patient with recent fall and some balance issues when stepping up onto curbs. He is neurologically intact. He is otherwise asymptomatic with this. He did have injury to his left elbow though the lesion appears to be well-healing at this time. No head injury or loss of consciousness. Suspect this is related to deconditioning from his recent hospitalizations. Offered to have him work with physical therapy again though he wants to do the exercises at home with his wife around to see if this will benefit him. He will follow-up in 3 weeks for reevaluation and if not improving would consider physical therapy evaluation. Given return precautions.

## 2016-11-13 ENCOUNTER — Ambulatory Visit: Payer: Medicare Other | Admitting: Family Medicine

## 2016-11-20 ENCOUNTER — Telehealth: Payer: Self-pay | Admitting: Family Medicine

## 2016-11-20 DIAGNOSIS — Z125 Encounter for screening for malignant neoplasm of prostate: Secondary | ICD-10-CM

## 2016-11-20 NOTE — Telephone Encounter (Signed)
I have placed a referral though I am able to perform his prostate exam if he would like.

## 2016-11-20 NOTE — Telephone Encounter (Signed)
Patient would like a referral to urologist for prostate checks, he was seeing someone in [place] before he moved here and would like to establish with a urologist here.

## 2016-11-20 NOTE — Telephone Encounter (Signed)
Pt wife called wanting to know if pt can be referred to a Urologist in [place]? Referral needed please and thank you!  Call wife @ [phone].

## 2016-11-20 NOTE — Telephone Encounter (Signed)
Patient notified and would like to have this done at the urologist

## 2016-11-26 ENCOUNTER — Telehealth: Payer: Self-pay | Admitting: Family Medicine

## 2016-11-26 NOTE — Telephone Encounter (Signed)
Patient advised would need appointment .  Appointment scheduled.

## 2016-11-26 NOTE — Telephone Encounter (Signed)
Pt called and stated that he thinks that he has wax build up in his left ear and is request an ear irrigation.  Please advise, thank you!  Call pt@ [phone]

## 2016-11-27 ENCOUNTER — Ambulatory Visit: Payer: Medicare Other | Admitting: Family Medicine

## 2016-11-28 ENCOUNTER — Ambulatory Visit (INDEPENDENT_AMBULATORY_CARE_PROVIDER_SITE_OTHER): Payer: Medicare Other | Admitting: Family

## 2016-11-28 ENCOUNTER — Encounter: Payer: Self-pay | Admitting: Family

## 2016-11-28 VITALS — BP 114/60 | HR 77 | Temp 98.4°F | Ht 68.0 in | Wt 151.2 lb

## 2016-11-28 DIAGNOSIS — H6123 Impacted cerumen, bilateral: Secondary | ICD-10-CM | POA: Diagnosis not present

## 2016-11-28 NOTE — Progress Notes (Addendum)
Subjective:    Patient ID: Henry Mays, male    DOB: 1941-01-04, 75 y.o.   MRN: [id number]  CC: Rutvik Reuter is a 75 y.o. male who presents today for an acute visit.    HPI: Chief complaint of left ear hearing loss over the past 2 days. He does wear hearing aids however hearing loss is worse in the left ear. Denies any pain, drainage, fever. He's had his ears impacted with for ear wax.    HISTORY:  Past Medical History:  Diagnosis Date  . Arthritis   . Basal cell carcinoma   . Chickenpox   . Hypertension   . Kidney stones    Past Surgical History:  Procedure Laterality Date  . BASAL CELL CARCINOMA EXCISION    . HERNIA REPAIR     Family History  Problem Relation Age of Onset  . Arthritis    . Stroke    . Hypertension    . Leukemia Father   . CAD Mother     Allergies: Patient has no known allergies. Current Outpatient Prescriptions on File Prior to Visit  Medication Sig Dispense Refill  . lisinopril (PRINIVIL,ZESTRIL) 5 MG tablet TAKE 1 TABLET BY MOUTH EVERY DAY 90 tablet 2   No current facility-administered medications on file prior to visit.     Social History  Substance Use Topics  . Smoking status: Former Research scientist (life sciences)  . Smokeless tobacco: Never Used  . Alcohol use 6.0 oz/week    10 Standard drinks or equivalent per week     Comment: Not drinking any alcohol since c diff diagnosis    Review of Systems  Constitutional: Negative for chills and fever.  HENT: Positive for hearing loss. Negative for congestion, ear pain, facial swelling and sore throat.   Respiratory: Negative for cough.   Cardiovascular: Negative for chest pain and palpitations.  Gastrointestinal: Negative for nausea and vomiting.      Objective:    BP 114/60   Pulse 77   Temp 98.4 F (36.9 C) (Oral)   Ht 5\' 8"  (1.727 m)   Wt 151 lb 3.2 oz (68.6 kg)   SpO2 98%   BMI 22.99 kg/m    Physical Exam  Constitutional: He appears well-developed and well-nourished.  HENT:  Right Ear:  Hearing, tympanic membrane, external ear and ear canal normal. No drainage, swelling or tenderness. No decreased hearing is noted.  Left Ear: External ear and ear canal normal. No drainage, swelling or tenderness. Decreased hearing is noted.  Complete cerumen impaction left ear. Unable to visualize tympanic membrane. Mild cerumen right ear.   Cardiovascular: Regular rhythm and normal heart sounds.   Pulmonary/Chest: Effort normal and breath sounds normal. No respiratory distress. He has no wheezes. He has no rhonchi. He has no rales.  Neurological: He is alert.  Skin: Skin is warm and dry.  Psychiatric: He has a normal mood and affect. His speech is normal and behavior is normal.  Vitals reviewed.      Assessment & Plan:  1. Bilateral impacted cerumen Bilateral cerumen impactions, resolved with multiple irrigation and manual removal of both left and right ears.  After which the EAC's and TM's are clear.      I am having Mr. Hochstetler maintain his lisinopril.   No orders of the defined types were placed in this encounter.   Return precautions given.   Risks, benefits, and alternatives of the medications and treatment plan prescribed today were discussed, and patient expressed understanding.   Education  regarding symptom management and diagnosis given to patient on AVS.  Continue to follow with Tommi Rumps, MD for routine health maintenance.   Roylee Rossano and I agreed with plan.   Mable Paris, FNP

## 2016-11-28 NOTE — Progress Notes (Signed)
Pre visit review using our clinic review tool, if applicable. No additional management support is needed unless otherwise documented below in the visit note. 

## 2016-11-30 ENCOUNTER — Ambulatory Visit (INDEPENDENT_AMBULATORY_CARE_PROVIDER_SITE_OTHER): Payer: Medicare Other

## 2016-11-30 DIAGNOSIS — M810 Age-related osteoporosis without current pathological fracture: Secondary | ICD-10-CM

## 2016-11-30 MED ORDER — DENOSUMAB 60 MG/ML ~~LOC~~ SOLN
60.0000 mg | Freq: Once | SUBCUTANEOUS | Status: AC
  Administered 2016-11-30: 60 mg via SUBCUTANEOUS

## 2016-11-30 NOTE — Progress Notes (Addendum)
Pt was in today receiving a prolia injection in the left arm. Pt tolerated well.  Reviewed.  Dr Nicki Reaper

## 2016-12-04 ENCOUNTER — Ambulatory Visit: Payer: Medicare Other

## 2017-01-10 ENCOUNTER — Ambulatory Visit: Payer: Medicare Other | Admitting: Family Medicine

## 2017-01-24 ENCOUNTER — Other Ambulatory Visit: Payer: Self-pay

## 2017-02-06 ENCOUNTER — Other Ambulatory Visit: Payer: Self-pay | Admitting: Family Medicine

## 2017-02-06 NOTE — Telephone Encounter (Signed)
Last OV 10/24/16 listed under historical provider

## 2017-02-06 NOTE — Telephone Encounter (Signed)
Please confirm what the patient takes this for. Thanks.

## 2017-02-07 NOTE — Telephone Encounter (Signed)
Patient takes 1 teaspoon a day for his mouth, to keep sores from starting

## 2017-02-27 ENCOUNTER — Ambulatory Visit (INDEPENDENT_AMBULATORY_CARE_PROVIDER_SITE_OTHER): Payer: Medicare Other | Admitting: Urology

## 2017-02-27 ENCOUNTER — Telehealth: Payer: Self-pay | Admitting: Urology

## 2017-02-27 ENCOUNTER — Encounter: Payer: Self-pay | Admitting: Urology

## 2017-02-27 VITALS — BP 131/82 | HR 68 | Ht 68.0 in | Wt 154.4 lb

## 2017-02-27 DIAGNOSIS — R972 Elevated prostate specific antigen [PSA]: Secondary | ICD-10-CM

## 2017-02-27 DIAGNOSIS — Z87442 Personal history of urinary calculi: Secondary | ICD-10-CM | POA: Diagnosis not present

## 2017-02-27 NOTE — Patient Instructions (Signed)

## 2017-02-27 NOTE — Progress Notes (Signed)
02/27/2017 12:42 PM   Henry Mays 02-25-1941 [id number]  Referring provider: Leone Haven, MD [address], [place] [address]  Chief Complaint  Patient presents with  . New Patient (Initial Visit)    HPI: 76 yo M With a history of elevated PSA previously followed by Dr. Tilden Fossa at [facility]. He has moved to [place] for retirement and is seeking to transfer his care here.    He has history of elevated PSA and underwent prostate biopsy in 2007 at which time his PSA was 3.30.  Biopsy was negative other than for chronic inflammation.  PSA trend as below.  Today, he has no significant urinary complaints. He does get up 1-2 times at nighttime and occasionally to void but this does not bother him. He takes no medications for his prostate.  He denies urinary frequency, urgency, weak stream, incomplete bladder emptying, gross hematuria, or dysuria.  He does have a history of kidney stones.  He is undergone right ESWL in 03/2015 for a 2 cm stone.  Stone composition was 80% uric acid, 10% calcium oxalate. No history of gout.  03/2015  3.7 08/2014  5.55 2/14  4.91 8/13  5.39 1/13 5.12 1/12 4.02 05/2010  3.74 12/10 5.73 12.09 3.54 10/09 10.01 10/08 3.06 9/07  3.31   PMH: Past Medical History:  Diagnosis Date  . Arthritis   . Basal cell carcinoma   . Chickenpox   . Hypertension   . Kidney stones     Surgical History: Past Surgical History:  Procedure Laterality Date  . BASAL CELL CARCINOMA EXCISION    . HERNIA REPAIR      Home Medications:  Allergies as of 02/27/2017   No Known Allergies     Medication List       Accurate as of 02/27/17 11:59 PM. Always use your most recent med list.          dexamethasone 0.5 MG/5ML elixir TAKE 2 TEASPOONFULS BY MOUTH 3 TIMES DAILY   lisinopril 5 MG tablet Commonly known as:  PRINIVIL,ZESTRIL TAKE 1 TABLET BY MOUTH EVERY DAY       Allergies: No Known Allergies  Family  History: Family History  Problem Relation Age of Onset  . Arthritis    . Stroke    . Hypertension    . Leukemia Father   . CAD Mother   . Prostate cancer Neg Hx   . Bladder Cancer Neg Hx   . Kidney cancer Neg Hx     Social History:  reports that he has quit smoking. He has never used smokeless tobacco. He reports that he drinks about 6.0 oz of alcohol per week . He reports that he does not use drugs.  ROS: UROLOGY Frequent Urination?: No Hard to postpone urination?: No Burning/pain with urination?: No Get up at night to urinate?: Yes Leakage of urine?: No Urine stream starts and stops?: No Trouble starting stream?: No Do you have to strain to urinate?: No Blood in urine?: No Urinary tract infection?: No Sexually transmitted disease?: No Injury to kidneys or bladder?: No Painful intercourse?: No Weak stream?: No Erection problems?: Yes Penile pain?: No  Gastrointestinal Nausea?: No Vomiting?: No Indigestion/heartburn?: No Diarrhea?: Yes Constipation?: No  Constitutional Fever: No Night sweats?: No Weight loss?: No Fatigue?: No  Skin Skin rash/lesions?: No Itching?: No  Eyes Blurred vision?: No Double vision?: No  Ears/Nose/Throat Sore throat?: No Sinus problems?: No  Hematologic/Lymphatic Swollen glands?: No Easy bruising?: Yes  Cardiovascular  Leg swelling?: No Chest pain?: No  Respiratory Cough?: No Shortness of breath?: No  Endocrine Excessive thirst?: No  Musculoskeletal Back pain?: No Joint pain?: No  Neurological Headaches?: No Dizziness?: No  Psychologic Depression?: No Anxiety?: No  Physical Exam: BP 131/82 (BP Location: Left Arm, Patient Position: Sitting, Cuff Size: Normal)   Pulse 68   Ht 5\' 8"  (1.727 m)   Wt 154 lb 6.4 oz (70 kg)   BMI 23.48 kg/m   Constitutional:  Alert and oriented, No acute distress. HEENT: [place] AT, moist mucus membranes.  Trachea midline, no masses. Cardiovascular: No clubbing, cyanosis, or  edema. Respiratory: Normal respiratory effort, no increased work of breathing. GI: Abdomen is soft, nontender, nondistended, no abdominal masses GU: No CVA tenderness.  Rectal: Normal sphincter tone. 50 cc prostate, nontender, no nodules. Skin: No rashes, bruises or suspicious lesions. Neurologic: Grossly intact, no focal deficits, moving all 4 extremities. Psychiatric: Normal mood and affect.  Laboratory Data: Lab Results  Component Value Date   WBC 5.4 09/27/2016   HGB 10.9 (L) 09/28/2016   HCT 30.8 (L) 09/27/2016   MCV 92.7 09/27/2016   PLT 243 09/27/2016    Lab Results  Component Value Date   CREATININE 0.84 10/10/2016   PSA trend as above  Lab Results  Component Value Date   HGBA1C 5.9 (H) 09/27/2016    Urinalysis n/a  Pertinent Imaging: n/a  Assessment & Plan:    1. Elevated PSA History of elevated PSA in the past, not checked since 2016 We discussed PSA screening and guidelines -generally advised to continue PSA screening until age 76 or if life expectancy is greater than 10 years We also discussed higher threshold for prostate biopsy Given his excellent health status, we'll continue to screen at this time then decrease interval Will call with results - PSA   2. History of kidney stones Currently asymptomatic, we'll follow clinically  Encourage adequate hydration    Return in about 2 years (around 02/28/2019).  Hollice Espy, MD  [facility] [address] [place],  [address] [phone]

## 2017-02-27 NOTE — Telephone Encounter (Signed)
Pt called the office stating that Dr. Erlene Quan asked him to find out when he had his Prostate Bx.  Pt called to give that information verbally.  His Bx was done March 2007 and stated that results were Chronic Prostatitis Bilaterally with no evidence of cancer.  Please feel free to call if need anything else.

## 2017-02-28 LAB — PSA: PROSTATE SPECIFIC AG, SERUM: 4.6 ng/mL — AB (ref 0.0–4.0)

## 2017-03-05 ENCOUNTER — Telehealth: Payer: Self-pay

## 2017-03-05 NOTE — Telephone Encounter (Signed)
-----   Message from Hollice Espy, MD sent at 03/05/2017 12:47 PM EDT ----- Your PSA is 4.6 which is excellent for your age and history. We will plan to recheck in 2 years as discussed.  Hollice Espy, MD

## 2017-03-05 NOTE — Telephone Encounter (Signed)
Spoke with pt in reference to PSA results. Pt voiced understanding.  

## 2017-03-14 DIAGNOSIS — Z85820 Personal history of malignant melanoma of skin: Secondary | ICD-10-CM | POA: Diagnosis not present

## 2017-03-14 DIAGNOSIS — Z08 Encounter for follow-up examination after completed treatment for malignant neoplasm: Secondary | ICD-10-CM | POA: Diagnosis not present

## 2017-03-14 DIAGNOSIS — X32XXXA Exposure to sunlight, initial encounter: Secondary | ICD-10-CM | POA: Diagnosis not present

## 2017-03-14 DIAGNOSIS — L570 Actinic keratosis: Secondary | ICD-10-CM | POA: Diagnosis not present

## 2017-03-14 DIAGNOSIS — L821 Other seborrheic keratosis: Secondary | ICD-10-CM | POA: Diagnosis not present

## 2017-03-14 DIAGNOSIS — Z85828 Personal history of other malignant neoplasm of skin: Secondary | ICD-10-CM | POA: Diagnosis not present

## 2017-03-19 DIAGNOSIS — Q046 Congenital cerebral cysts: Secondary | ICD-10-CM | POA: Diagnosis not present

## 2017-04-03 DIAGNOSIS — G609 Hereditary and idiopathic neuropathy, unspecified: Secondary | ICD-10-CM | POA: Diagnosis not present

## 2017-04-03 DIAGNOSIS — D472 Monoclonal gammopathy: Secondary | ICD-10-CM | POA: Diagnosis not present

## 2017-04-03 DIAGNOSIS — Q046 Congenital cerebral cysts: Secondary | ICD-10-CM | POA: Diagnosis not present

## 2017-04-09 DIAGNOSIS — H903 Sensorineural hearing loss, bilateral: Secondary | ICD-10-CM | POA: Diagnosis not present

## 2017-04-23 ENCOUNTER — Other Ambulatory Visit: Payer: Self-pay | Admitting: Family Medicine

## 2017-05-02 ENCOUNTER — Encounter: Payer: Self-pay | Admitting: Emergency Medicine

## 2017-05-02 ENCOUNTER — Emergency Department: Payer: Medicare Other

## 2017-05-02 ENCOUNTER — Emergency Department
Admission: EM | Admit: 2017-05-02 | Discharge: 2017-05-03 | Disposition: A | Discharge status: Home / Self Care | Payer: Medicare Other | Attending: Emergency Medicine | Admitting: Emergency Medicine

## 2017-05-02 DIAGNOSIS — T148XXA Other injury of unspecified body region, initial encounter: Secondary | ICD-10-CM

## 2017-05-02 DIAGNOSIS — W19XXXA Unspecified fall, initial encounter: Secondary | ICD-10-CM | POA: Diagnosis not present

## 2017-05-02 DIAGNOSIS — S80212A Abrasion, left knee, initial encounter: Secondary | ICD-10-CM | POA: Insufficient documentation

## 2017-05-02 DIAGNOSIS — Y998 Other external cause status: Secondary | ICD-10-CM | POA: Insufficient documentation

## 2017-05-02 DIAGNOSIS — W0110XA Fall on same level from slipping, tripping and stumbling with subsequent striking against unspecified object, initial encounter: Secondary | ICD-10-CM | POA: Diagnosis not present

## 2017-05-02 DIAGNOSIS — Z87891 Personal history of nicotine dependence: Secondary | ICD-10-CM | POA: Diagnosis not present

## 2017-05-02 DIAGNOSIS — Z79899 Other long term (current) drug therapy: Secondary | ICD-10-CM | POA: Diagnosis not present

## 2017-05-02 DIAGNOSIS — S0003XA Contusion of scalp, initial encounter: Secondary | ICD-10-CM | POA: Insufficient documentation

## 2017-05-02 DIAGNOSIS — Y9389 Activity, other specified: Secondary | ICD-10-CM | POA: Diagnosis not present

## 2017-05-02 DIAGNOSIS — N179 Acute kidney failure, unspecified: Secondary | ICD-10-CM | POA: Diagnosis not present

## 2017-05-02 DIAGNOSIS — I10 Essential (primary) hypertension: Secondary | ICD-10-CM | POA: Diagnosis not present

## 2017-05-02 DIAGNOSIS — S0990XA Unspecified injury of head, initial encounter: Secondary | ICD-10-CM | POA: Diagnosis not present

## 2017-05-02 DIAGNOSIS — R42 Dizziness and giddiness: Secondary | ICD-10-CM | POA: Diagnosis present

## 2017-05-02 DIAGNOSIS — S81011A Laceration without foreign body, right knee, initial encounter: Secondary | ICD-10-CM | POA: Diagnosis not present

## 2017-05-02 DIAGNOSIS — S50311A Abrasion of right elbow, initial encounter: Secondary | ICD-10-CM | POA: Insufficient documentation

## 2017-05-02 DIAGNOSIS — Y92199 Unspecified place in other specified residential institution as the place of occurrence of the external cause: Secondary | ICD-10-CM | POA: Diagnosis not present

## 2017-05-02 DIAGNOSIS — S51011A Laceration without foreign body of right elbow, initial encounter: Secondary | ICD-10-CM | POA: Diagnosis not present

## 2017-05-02 LAB — CBC
HEMATOCRIT: 36.3 % — AB (ref 40.0–52.0)
HEMOGLOBIN: 12.4 g/dL — AB (ref 13.0–18.0)
MCH: 32.2 pg (ref 26.0–34.0)
MCHC: 34.1 g/dL (ref 32.0–36.0)
MCV: 94.3 fL (ref 80.0–100.0)
Platelets: 287 10*3/uL (ref 150–440)
RBC: 3.85 MIL/uL — AB (ref 4.40–5.90)
RDW: 13.9 % (ref 11.5–14.5)
WBC: 8.5 10*3/uL (ref 3.8–10.6)

## 2017-05-02 LAB — TROPONIN I

## 2017-05-02 LAB — BASIC METABOLIC PANEL
ANION GAP: 6 (ref 5–15)
BUN: 26 mg/dL — ABNORMAL HIGH (ref 6–20)
CALCIUM: 9.7 mg/dL (ref 8.9–10.3)
CO2: 25 mmol/L (ref 22–32)
Chloride: 102 mmol/L (ref 101–111)
Creatinine, Ser: 1.41 mg/dL — ABNORMAL HIGH (ref 0.61–1.24)
GFR, EST AFRICAN AMERICAN: 55 mL/min — AB (ref >60)
GFR, EST NON AFRICAN AMERICAN: 47 mL/min — AB (ref >60)
Glucose, Bld: 138 mg/dL — ABNORMAL HIGH (ref 65–99)
POTASSIUM: 4.5 mmol/L (ref 3.5–5.1)
Sodium: 133 mmol/L — ABNORMAL LOW (ref 135–145)

## 2017-05-02 MED ORDER — SODIUM CHLORIDE 0.9 % IV BOLUS (SEPSIS)
1000.0000 mL | Freq: Once | INTRAVENOUS | Status: AC
  Administered 2017-05-02: 1000 mL via INTRAVENOUS

## 2017-05-02 NOTE — ED Triage Notes (Signed)
Patient comes in from independent living at [facility] with an unwitnessed fall. Patient was in his kitchen got dizzy and fell. Patient hit back of head has a hematoma on his head with skin tears to his left knee and right elbow. Patient denies LOC. Patient told EMS his blood pressure was low for him. Patient denies taking a blood thinner.

## 2017-05-02 NOTE — ED Notes (Signed)
Called security at [facility] [place] at [phone]. They will come and get him in 30-45 minutes.

## 2017-05-02 NOTE — ED Provider Notes (Signed)
[facility] Emergency Department Provider Note ____________________________________________   I have reviewed the triage vital signs and the triage nursing note.  HISTORY  Chief Complaint Fall and Dizziness   Historian Patient  HPI Henry Mays is a 76 y.o. male lives at independent living with his wife, states he was cleaning up after dinner and felt off balance and went backwards striking the back of his head. No loss of consciousness. He also skinned his left knee but is able to walk. No neck pain. States that he gets off balance occasionally, and if it weren't for the actual head injury, he would not have came in for the balance issue itself.  No recent illnesses.  Mild pain at the head. Tetanus is up-to-date.    Past Medical History:  Diagnosis Date  . Arthritis   . Basal cell carcinoma   . Chickenpox   . Hypertension   . Kidney stones     Patient Active Problem List   Diagnosis Date Noted  . Fall 10/24/2016  . Fever 09/25/2016  . Diarrhea 09/25/2016  . Laceration of head 09/07/2016  . Orthostatic hypotension 08/28/2016  . PVC (premature ventricular contraction) 08/28/2016  . C. difficile diarrhea   . Minor head injury   . Near syncope   . Intractable hiccups 08/07/2016  . Cellulitis 08/07/2016  . 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 Osteoporosis 05/22/2016  . Decreased right shoulder range of motion 04/10/2016  . Essential hypertension 04/10/2016  . MGUS (monoclonal gammopathy of unknown significance) 04/10/2016    Past Surgical History:  Procedure Laterality Date  . BASAL CELL CARCINOMA EXCISION    . HERNIA REPAIR      Prior to Admission medications   Medication Sig Start Date End Date Taking? Authorizing Provider  dexamethasone 0.5 MG/5ML elixir TAKE 2 TEASPOONFULS BY MOUTH 3 TIMES DAILY 02/07/17   Leone Haven, MD  lisinopril (PRINIVIL,ZESTRIL) 5 MG tablet TAKE 1 TABLET BY MOUTH DAILY 04/23/17   Leone Haven,  MD    No Known Allergies  Family History  Problem Relation Age of Onset  . Arthritis Unknown   . Stroke Unknown   . Hypertension Unknown   . Leukemia Father   . CAD Mother   . Prostate cancer Neg Hx   . Bladder Cancer Neg Hx   . Kidney cancer Neg Hx     Social History Social History  Substance Use Topics  . Smoking status: Former Research scientist (life sciences)  . Smokeless tobacco: Never Used  . Alcohol use 6.0 oz/week    10 Standard drinks or equivalent per week     Comment: Not drinking any alcohol since c diff diagnosis    Review of Systems  Constitutional: Negative for fever. Eyes: Negative for visual changes. ENT: Negative for sore throat. Cardiovascular: Negative for chest pain. Respiratory: Negative for shortness of breath. Gastrointestinal: Negative for abdominal pain, vomiting and diarrhea. Genitourinary: Negative for dysuria. Musculoskeletal: Negative for back pain. Skin: Negative for rash. Neurological: Positive for mild headache.  ____________________________________________   PHYSICAL EXAM:  VITAL SIGNS: ED Triage Vitals [05/02/17 2141]  Enc Vitals Group     BP 106/60     Pulse Rate (!) 108     Resp 16     Temp 97.8 F (36.6 C)     Temp Source Oral     SpO2 93 %     Weight 150 lb (68 kg)     Height 5\' 9"  (1.753 m)  Head Circumference      Peak Flow      Pain Score      Pain Loc      Pain Edu?      Excl. in [place]?      Constitutional: Alert and oriented. Well appearing and in no distress. HEENT   Head: Hematoma posterior scalp.      Eyes: Conjunctivae are normal. Pupils equal and round.       Ears:   Hearing aids in place.      Nose: No congestion/rhinnorhea.   Mouth/Throat: Mucous membranes are moist.   Neck: No stridor.  No C-spine tenderness palpation or with range of motion. Cardiovascular/Chest: Normal rate, regular rhythm.  No murmurs, rubs, or gallops. Respiratory: Normal respiratory effort without tachypnea nor retractions. Breath sounds  are clear and equal bilaterally. No wheezes/rales/rhonchi. Gastrointestinal: Soft. No distention, no guarding, no rebound. Nontender.    Genitourinary/rectal:Deferred Musculoskeletal: Pelvis stable and nontender. Skin tear to the left knee and right elbow without bony point tenderness. Nontender with normal range of motion in all extremities. No joint effusions.  No lower extremity tenderness.  No edema. Neurologic:  Normal speech and language. No gross or focal neurologic deficits are appreciated. Skin:  Skin is warm, dry and intact. No rash noted.  Mild skin abrasion/skin tear left knee, right elbow. Psychiatric: Mood and affect are normal. Speech and behavior are normal. Patient exhibits appropriate insight and judgment.   ____________________________________________  LABS (pertinent positives/negatives)  Labs Reviewed  BASIC METABOLIC PANEL - Abnormal; Notable for the following:       Result Value   Sodium 133 (*)    Glucose, Bld 138 (*)    BUN 26 (*)    Creatinine, Ser 1.41 (*)    GFR calc non Af Amer 47 (*)    GFR calc Af Amer 55 (*)    All other components within normal limits  CBC - Abnormal; Notable for the following:    RBC 3.85 (*)    Hemoglobin 12.4 (*)    HCT 36.3 (*)    All other components within normal limits  TROPONIN I  URINALYSIS, COMPLETE (UACMP) WITH MICROSCOPIC  CBG MONITORING, ED    ____________________________________________    EKG I, Lisa Roca, MD, the attending physician have personally viewed and interpreted all ECGs.  95 bpm. Normal sinus rhythm. Narrow QRS. Normal axis. Normal ST and T-wave ____________________________________________  RADIOLOGY All Xrays were viewed by me. Imaging interpreted by Radiologist.  CT without contrast:  IMPRESSION: 1. No CT evidence for acute intracranial abnormality. 2. Stable 14 mm mass near the foramen of Monro thought to represent colloid cyst on prior  MRI. __________________________________________  PROCEDURES  Procedure(s) performed: None  Critical Care performed: None  ____________________________________________   ED COURSE / ASSESSMENT AND PLAN  Pertinent labs & imaging results that were available during my care of the patient were reviewed by me and considered in my medical decision making (see chart for details).    From a traumatic standpoint, head CT shows no acute findings. C-spine cleared clinically. No other concern for traumatic injury other than skin tears.  Laboratory studies show mild acute renal failure, likely dehydration related. He was given 1 L normal saline here. It is okay for him to discharge and follow up with his primary care doctor with respect to that. He states the dizziness that led to his fall is pretty typical for him. I don't think needs hospitalization. Not concerned at this point for [place]  or stroke or arrhythmia.    CONSULTATIONS:   None   Patient / Family / Caregiver informed of clinical course, medical decision-making process, and agree with plan.   I discussed return precautions, follow-up instructions, and discharge instructions with patient and/or family.  Discharge Instructions : You were evaluated after fall, no serious injury was found today in emergency department.  Return to the emergency department right immediately for any worsening symptoms or playing computer altered mental status, weakness, numbness, passing out, or any other symptoms concerning to you.  ___________________________________________   FINAL CLINICAL IMPRESSION(S) / ED DIAGNOSES   Final diagnoses:  Multiple skin tears  Scalp hematoma, initial encounter  Acute renal failure, unspecified acute renal failure type [facility])              Note: This dictation was prepared with Dragon dictation. Any transcriptional errors that result from this process are unintentional    Lisa Roca, MD 05/02/17  2253

## 2017-05-02 NOTE — Discharge Instructions (Signed)
You were evaluated after fall, no serious injury was found today in emergency department.  Return to the emergency department right immediately for any worsening symptoms or playing computer altered mental status, weakness, numbness, passing out, or any other symptoms concerning to you.

## 2017-05-03 NOTE — ED Notes (Signed)
Patient picked up by [facility] [place] security to be taken back to his independent living. Patient wheeled out.

## 2017-05-14 ENCOUNTER — Ambulatory Visit (INDEPENDENT_AMBULATORY_CARE_PROVIDER_SITE_OTHER): Payer: Medicare Other | Admitting: Family Medicine

## 2017-05-14 ENCOUNTER — Encounter: Payer: Self-pay | Admitting: Family Medicine

## 2017-05-14 ENCOUNTER — Ambulatory Visit: Payer: Medicare Other | Admitting: Family Medicine

## 2017-05-14 VITALS — BP 136/82 | HR 97 | Temp 98.2°F | Wt 151.2 lb

## 2017-05-14 DIAGNOSIS — B070 Plantar wart: Secondary | ICD-10-CM | POA: Diagnosis not present

## 2017-05-14 DIAGNOSIS — W19XXXA Unspecified fall, initial encounter: Secondary | ICD-10-CM | POA: Diagnosis not present

## 2017-05-14 DIAGNOSIS — R2689 Other abnormalities of gait and mobility: Secondary | ICD-10-CM | POA: Diagnosis not present

## 2017-05-14 DIAGNOSIS — N179 Acute kidney failure, unspecified: Secondary | ICD-10-CM | POA: Insufficient documentation

## 2017-05-14 LAB — BASIC METABOLIC PANEL
BUN: 22 mg/dL (ref 6–23)
CO2: 25 meq/L (ref 19–32)
Calcium: 10.2 mg/dL (ref 8.4–10.5)
Chloride: 98 mEq/L (ref 96–112)
Creatinine, Ser: 1.03 mg/dL (ref 0.40–1.50)
GFR: 74.71 mL/min (ref >60.00)
GLUCOSE: 127 mg/dL — AB (ref 70–99)
POTASSIUM: 4.2 meq/L (ref 3.5–5.1)
Sodium: 132 mEq/L — ABNORMAL LOW (ref 135–145)

## 2017-05-14 NOTE — Assessment & Plan Note (Signed)
Recent fall that was evaluated in the emergency room. Had a reassuring CT scan. Found to be slightly dehydrated. I suspect his falls are related to balance difficulties given that they typically occur with rapid change in direction or change in surface height. He did well with physical therapy previously. He is neurologically intact today. We will refer back to physical therapy.

## 2017-05-14 NOTE — Assessment & Plan Note (Signed)
Recheck BMP. Encouraged hydration.

## 2017-05-14 NOTE — Assessment & Plan Note (Signed)
Lesion on right foot has the appearance of a plantar wart. Encouraged salicylic acid pads.

## 2017-05-14 NOTE — Progress Notes (Signed)
  Tommi Rumps, MD Phone: [phone]  Henry Mays is a 76 y.o. male who presents today for follow-up.  Patient was seen in the emergency room about 2 weeks ago after he lost his balance and fell and at the back of his head. Notes he turned too quickly and felt off balance and fell. He had no loss of consciousness. No bleeding at that time. He had a blood blister. He had a negative CT of his head for acute process and did show chronic stable cyst. He's had 3 falls over the last year or so. Typically related to balance issues when he is turning too quickly or when he changes levels. He was doing physical therapy though stopped doing the exercises after stopping physical therapy. He was found to be dehydrated in the emergency room and was given 1 L of normal saline. He additionally notes a callus on the bottom of his right foot. He saw dermatology and they scraped some of it off. It does occasionally hurt him.  PMH: Former smoker   ROS see history of present illness  Objective  Physical Exam Vitals:   05/14/17 0827  BP: 136/82  Pulse: 97  Temp: 98.2 F (36.8 C)    BP Readings from Last 3 Encounters:  05/14/17 136/82  05/02/17 109/63  02/27/17 131/82   Wt Readings from Last 3 Encounters:  05/14/17 151 lb 3.2 oz (68.6 kg)  05/02/17 150 lb (68 kg)  02/27/17 154 lb 6.4 oz (70 kg)    Physical Exam  Constitutional: No distress.  Cardiovascular: Normal rate, regular rhythm and normal heart sounds.   Pulmonary/Chest: Effort normal and breath sounds normal.  Neurological: He is alert. Gait normal.  CN 2-12 intact, 5/5 strength in bilateral biceps, triceps, grip, quads, hamstrings, plantar and dorsiflexion, sensation to light touch intact in bilateral UE and LE, normal gait, though does appear to have some slight imbalance when he turns too quickly, normal finger to nose, negative Romberg, no pronator drift, normal rapid alternating movements  Skin: He is not diaphoretic.  Plantar  wart noted right ball of foot, posterior scalp with scab noted, nontender, no surrounding erythema     Assessment/Plan: Please see individual problem list.  Fall Recent fall that was evaluated in the emergency room. Had a reassuring CT scan. Found to be slightly dehydrated. I suspect his falls are related to balance difficulties given that they typically occur with rapid change in direction or change in surface height. He did well with physical therapy previously. He is neurologically intact today. We will refer back to physical therapy.  AKI (acute kidney injury) ([place]) Recheck BMP. Encouraged hydration.  Plantar wart of right foot Lesion on right foot has the appearance of a plantar wart. Encouraged salicylic acid pads.   Orders Placed This Encounter  Procedures  . Basic metabolic panel  . Ambulatory referral to Physical Therapy    Referral Priority:   Routine    Referral Type:   Physical Medicine    Referral Reason:   Specialty Services Required    Requested Specialty:   Physical Therapy    Number of Visits Requested:   1    Tommi Rumps, MD [place]

## 2017-05-14 NOTE — Patient Instructions (Signed)
Nice to see you. We will get she set up with physical therapy. They will work on Artist. You can try salicylic acid pads for the likely wart on the ball of your right foot. Please try to stay well hydrated.

## 2017-05-17 ENCOUNTER — Other Ambulatory Visit: Payer: Self-pay | Admitting: Family Medicine

## 2017-05-17 DIAGNOSIS — E871 Hypo-osmolality and hyponatremia: Secondary | ICD-10-CM

## 2017-05-24 ENCOUNTER — Other Ambulatory Visit (INDEPENDENT_AMBULATORY_CARE_PROVIDER_SITE_OTHER): Payer: Medicare Other

## 2017-05-24 DIAGNOSIS — E871 Hypo-osmolality and hyponatremia: Secondary | ICD-10-CM | POA: Diagnosis not present

## 2017-05-24 LAB — BASIC METABOLIC PANEL WITH GFR
BUN: 21 mg/dL (ref 6–23)
CO2: 29 meq/L (ref 19–32)
Calcium: 10 mg/dL (ref 8.4–10.5)
Chloride: 97 meq/L (ref 96–112)
Creatinine, Ser: 1.11 mg/dL (ref 0.40–1.50)
GFR: 68.53 mL/min (ref >60.00)
Glucose, Bld: 118 mg/dL — ABNORMAL HIGH (ref 70–99)
Potassium: 4 meq/L (ref 3.5–5.1)
Sodium: 133 meq/L — ABNORMAL LOW (ref 135–145)

## 2017-06-19 ENCOUNTER — Ambulatory Visit (INDEPENDENT_AMBULATORY_CARE_PROVIDER_SITE_OTHER): Payer: Medicare Other | Admitting: Family Medicine

## 2017-06-19 ENCOUNTER — Encounter: Payer: Self-pay | Admitting: Family Medicine

## 2017-06-19 DIAGNOSIS — R066 Hiccough: Secondary | ICD-10-CM | POA: Diagnosis not present

## 2017-06-19 DIAGNOSIS — W19XXXA Unspecified fall, initial encounter: Secondary | ICD-10-CM | POA: Diagnosis not present

## 2017-06-19 MED ORDER — CHLORPROMAZINE HCL 25 MG PO TABS: 25.0000 mg | ORAL_TABLET | Freq: Three times a day (TID) | ORAL | 0 refills | Status: DC | PRN

## 2017-06-19 NOTE — Assessment & Plan Note (Signed)
History of intermittent intractable hiccups. In the past has been treated well with Thorazine when it does not respond to other maneuvers. Has not taken this in some time. [place] and I believe it would be reasonable to provide him with a small number of pills of this to take if he develops hiccups that do not respond to any other maneuvers. Advised not to drive while taking this.

## 2017-06-19 NOTE — Assessment & Plan Note (Signed)
Has not had any additional falls. Balance has been improving with exercise. Encouraged to do chair exercises and to continue to walk. He'll continue to monitor.

## 2017-06-19 NOTE — Progress Notes (Signed)
  Tommi Rumps, MD Phone: [phone]  Henry Mays is a 76 y.o. male who presents today for f/u.  Patient notes his balance is quite a bit better. He has not had any falls or even felt like he was going to fall since his last visit. He has been walking daily to help with this. He does know chair exercises to do though has not been doing them. He has not felt imbalanced. He has been going up and down curbs well. He has been avoiding sloping surfaces.  History of intractable hiccups: In the past he has taken Thorazine for this. He'll take this after trying apple cider vinegar or vagal maneuvers. No hiccups currently.   ROS see history of present illness  Objective  Physical Exam Vitals:   06/19/17 1424  BP: 100/60  Pulse: 96  Temp: 98.4 F (36.9 C)    BP Readings from Last 3 Encounters:  06/19/17 100/60  05/14/17 136/82  05/02/17 109/63   Wt Readings from Last 3 Encounters:  06/19/17 153 lb 3.2 oz (69.5 kg)  05/14/17 151 lb 3.2 oz (68.6 kg)  05/02/17 150 lb (68 kg)    Physical Exam  Constitutional: No distress.  Cardiovascular: Normal rate, regular rhythm and normal heart sounds.   Pulmonary/Chest: Effort normal and breath sounds normal.  Neurological: He is alert. Gait normal.  CN 2-12 intact, 5/5 strength in bilateral biceps, triceps, grip, quads, hamstrings, plantar and dorsiflexion, sensation to light touch intact in bilateral UE and LE, normal gait  Skin: Skin is warm and dry. He is not diaphoretic.     Assessment/Plan: Please see individual problem list.  Fall Has not had any additional falls. Balance has been improving with exercise. Encouraged to do chair exercises and to continue to walk. He'll continue to monitor.  Intractable hiccups History of intermittent intractable hiccups. In the past has been treated well with Thorazine when it does not respond to other maneuvers. Has not taken this in some time. [place] is [place] of the [place] and I believe it  would be reasonable to provide him with a small number of pills of this to take if he develops hiccups that do not respond to any other maneuvers. Advised not to drive while taking this.   No orders of the defined types were placed in this encounter.   Meds ordered this encounter  Medications  . chlorproMAZINE (THORAZINE) 25 MG tablet    Sig: Take 1 tablet (25 mg total) by mouth 3 (three) times daily as needed for hiccoughs.    Dispense:  4 tablet    Refill:  0    Tommi Rumps, MD [place]

## 2017-06-19 NOTE — Patient Instructions (Signed)
Nice to see you. Please continue to work on walking and do chair exercises for your balance. I have sent in Thorazine for you to use for hiccups if you develop intractable hiccups that do not respond to holding your breath or Valsalva.

## 2017-06-20 ENCOUNTER — Telehealth: Payer: Self-pay

## 2017-06-20 NOTE — Telephone Encounter (Signed)
Spoke with the patient regarding his Prolia injection which is due, he is unable to schedule at this time as they are going out of town.  Patient will return a call to the office to schedule after there vacation after the 22nd of the month.  Prolia injection ordered.

## 2017-07-08 DIAGNOSIS — H01003 Unspecified blepharitis right eye, unspecified eyelid: Secondary | ICD-10-CM | POA: Diagnosis not present

## 2017-07-10 NOTE — Telephone Encounter (Signed)
Pt called back and is scheduled for 8/9 @ 9am.

## 2017-07-10 NOTE — Telephone Encounter (Signed)
Noted thanks °

## 2017-07-18 ENCOUNTER — Ambulatory Visit (INDEPENDENT_AMBULATORY_CARE_PROVIDER_SITE_OTHER): Payer: Medicare Other

## 2017-07-18 DIAGNOSIS — M810 Age-related osteoporosis without current pathological fracture: Secondary | ICD-10-CM

## 2017-07-18 MED ORDER — DENOSUMAB 60 MG/ML ~~LOC~~ SOLN
60.0000 mg | Freq: Once | SUBCUTANEOUS | Status: AC
  Administered 2017-07-18: 60 mg via SUBCUTANEOUS

## 2017-07-18 NOTE — Progress Notes (Signed)
Patient comes in today for Prolia injection. Injected Right Arm [place]. Patient tolerated injection well. Advise patient to schedule another one if needed in 6 months from injected date. No complaint of pain after injection.

## 2017-07-19 NOTE — Progress Notes (Signed)
I have reviewed the above note and agree.  Zyana Amaro, M.D.  

## 2017-07-24 ENCOUNTER — Telehealth: Payer: Self-pay | Admitting: Family Medicine

## 2017-07-24 ENCOUNTER — Other Ambulatory Visit: Payer: Self-pay | Admitting: Family Medicine

## 2017-07-24 NOTE — Telephone Encounter (Signed)
At this time I do not think he has a medical reason to excuse him from jury duty.

## 2017-07-24 NOTE — Telephone Encounter (Signed)
Please advise 

## 2017-07-24 NOTE — Telephone Encounter (Signed)
Patient notified

## 2017-07-24 NOTE — Telephone Encounter (Signed)
Pt called and stated that he received a summons for jury duty and there is a option saying "I am 53 years or old and wish for exemption..." Pt would like to know what is a good reason medically that he could put on there. Please advise, thank you!  Call pt @ [phone]

## 2017-07-28 ENCOUNTER — Emergency Department
Admission: EM | Admit: 2017-07-28 | Discharge: 2017-07-28 | Disposition: A | Discharge status: Home / Self Care | Payer: Medicare Other | Attending: Emergency Medicine | Admitting: Emergency Medicine

## 2017-07-28 DIAGNOSIS — Z79899 Other long term (current) drug therapy: Secondary | ICD-10-CM | POA: Insufficient documentation

## 2017-07-28 DIAGNOSIS — R2232 Localized swelling, mass and lump, left upper limb: Secondary | ICD-10-CM | POA: Diagnosis present

## 2017-07-28 DIAGNOSIS — Z87891 Personal history of nicotine dependence: Secondary | ICD-10-CM | POA: Diagnosis not present

## 2017-07-28 DIAGNOSIS — L03114 Cellulitis of left upper limb: Secondary | ICD-10-CM

## 2017-07-28 DIAGNOSIS — I10 Essential (primary) hypertension: Secondary | ICD-10-CM | POA: Insufficient documentation

## 2017-07-28 MED ORDER — CEPHALEXIN 500 MG PO CAPS: 500.0000 mg | ORAL_CAPSULE | Freq: Four times a day (QID) | ORAL | 0 refills | Status: AC

## 2017-07-28 MED ORDER — BACITRACIN ZINC 500 UNIT/GM EX OINT
TOPICAL_OINTMENT | Freq: Once | CUTANEOUS | Status: AC
  Administered 2017-07-28: 1 via TOPICAL
  Filled 2017-07-28: qty 0.9

## 2017-07-28 NOTE — ED Provider Notes (Signed)
[facility] Emergency Department Provider Note    ____________________________________________   I have reviewed the triage vital signs and the nursing notes.   HISTORY  Chief Complaint Cellulitis   History limited by: Not Limited   HPI Henry Mays is a 76 y.o. male who presents to the emergency department today because of concerns for possible in infection. Patient states that 4 or 5 days ago he cut his left upper arm. He did place bandages on it. He states that he thinks he left the Band-Aids on 2 long because when he took them off they ripped the skin off. Today he noticed some swelling and more redness to his left upper arm. Has been slightly uncomfortable. He denies any fevers. No nausea or vomiting. States last tetanus shot was a little over 2 years ago.   Past Medical History:  Diagnosis Date  . Arthritis   . Basal cell carcinoma   . Chickenpox   . Hypertension   . Kidney stones     Patient Active Problem List   Diagnosis Date Noted  . AKI (acute kidney injury) ([place]) 05/14/2017  . Plantar wart of right foot 05/14/2017  . Fall 10/24/2016  . Orthostatic hypotension 08/28/2016  . PVC (premature ventricular contraction) 08/28/2016  . History of Clostridium difficile infection   . Near syncope   . Intractable hiccups 08/07/2016  . Colloid cyst of brain ([place]) 05/22/2016  . Osteoporosis 05/22/2016  . Decreased right shoulder range of motion 04/10/2016  . Essential hypertension 04/10/2016  . MGUS (monoclonal gammopathy of unknown significance) 04/10/2016    Past Surgical History:  Procedure Laterality Date  . BASAL CELL CARCINOMA EXCISION    . HERNIA REPAIR      Prior to Admission medications   Medication Sig Start Date End Date Taking? Authorizing Provider  lisinopril (PRINIVIL,ZESTRIL) 5 MG tablet TAKE ONE TABLET BY MOUTH EVERY DAY 07/24/17  Yes Leone Haven, MD  cephALEXin (KEFLEX) 500 MG capsule Take 1 capsule (500 mg total)  by mouth 4 (four) times daily. 07/28/17 08/07/17  Nance Pear, MD  chlorproMAZINE (THORAZINE) 25 MG tablet Take 1 tablet (25 mg total) by mouth 3 (three) times daily as needed for hiccoughs. Patient not taking: Reported on 07/28/2017 06/19/17   Leone Haven, MD  dexamethasone 0.5 MG/5ML elixir TAKE 2 TEASPOONFULS BY MOUTH 3 TIMES DAILY Patient not taking: Reported on 07/28/2017 02/07/17   Leone Haven, MD    Allergies Patient has no known allergies.  Family History  Problem Relation Age of Onset  . Arthritis Unknown   . Stroke Unknown   . Hypertension Unknown   . Leukemia Father   . CAD Mother   . Prostate cancer Neg Hx   . Bladder Cancer Neg Hx   . Kidney cancer Neg Hx     Social History Social History  Substance Use Topics  . Smoking status: Former Research scientist (life sciences)  . Smokeless tobacco: Never Used  . Alcohol use 6.0 oz/week    10 Standard drinks or equivalent per week     Comment: Not drinking any alcohol since c diff diagnosis    Review of Systems Constitutional: No fever/chills Eyes: No visual changes. ENT: No sore throat. Cardiovascular: Denies chest pain. Respiratory: Denies shortness of breath. Gastrointestinal: No abdominal pain.  No nausea, no vomiting.  No diarrhea.   Genitourinary: Negative for dysuria. Musculoskeletal: Negative for back pain. Skin: Positive for skin tears and swelling to left upper arm Neurological: Negative for headaches, focal weakness  or numbness.  ____________________________________________   PHYSICAL EXAM:  VITAL SIGNS: ED Triage Vitals  Enc Vitals Group     BP 07/28/17 0849 (!) 157/69     Pulse Rate 07/28/17 0849 88     Resp 07/28/17 0849 18     Temp 07/28/17 0849 99.3 F (37.4 C)     Temp Source 07/28/17 0849 Oral     SpO2 07/28/17 0849 99 %     Weight 07/28/17 0849 150 lb (68 kg)     Height 07/28/17 0849 5\' 8"  (1.727 m)     Head Circumference --      Peak Flow --      Pain Score 07/28/17 0848 2    Constitutional:  Alert and oriented. Well appearing and in no distress. Eyes: Conjunctivae are normal.  ENT   Head: Normocephalic and atraumatic.   Nose: No congestion/rhinnorhea.   Mouth/Throat: Mucous membranes are moist.   Neck: No stridor. Cardiovascular: Normal rate, regular rhythm.  No murmurs, rubs, or gallops. Respiratory: Normal respiratory effort without tachypnea nor retractions. Breath sounds are clear and equal bilaterally. No wheezes/rales/rhonchi. Gastrointestinal: Soft and non tender. No rebound. No guarding.  Genitourinary: Deferred Musculoskeletal: Skin tears to left upper arm Neurologic:  Normal speech and language. No gross focal neurologic deficits are appreciated.  Skin:  Healing skin tears to left upper arm. Some surrounding erythema to the posterior proximal skin tear2 Psychiatric: Mood and affect are normal. Speech and behavior are normal. Patient exhibits appropriate insight and judgment.  ____________________________________________    LABS (pertinent positives/negatives)  None  ____________________________________________   EKG  None  ____________________________________________    RADIOLOGY  None   ____________________________________________   PROCEDURES  Procedures  ____________________________________________   INITIAL IMPRESSION / ASSESSMENT AND PLAN / ED COURSE  Pertinent labs & imaging results that were available during my care of the patient were reviewed by me and considered in my medical decision making (see chart for details).  Patient presented to the emergency department today because of concerns for swelling and possible infection of his left upper arm. He does have a 2 healing skin tear type lesions on his left upper extremity. The more proximal one does have some surrounding erythema raising concern for cellulitis. Patient without any systemic symptoms of illness. Will plan on treating with antibiotics. Will have patient  follow-up with primary care.  ____________________________________________   FINAL CLINICAL IMPRESSION(S) / ED DIAGNOSES  Final diagnoses:  Cellulitis of left upper extremity     Note: This dictation was prepared with Dragon dictation. Any transcriptional errors that result from this process are unintentional     Nance Pear, MD 07/28/17 [phone]

## 2017-07-28 NOTE — Discharge Instructions (Signed)
Please seek medical attention for any high fevers, chest pain, shortness of breath, change in behavior, persistent vomiting, bloody stool or any other new or concerning symptoms.  

## 2017-07-28 NOTE — ED Triage Notes (Signed)
Pt arrives to ER via POV c/o swelling and redness to left forearm. Pt had cut on left forearm approx 1 week ago. Took band aid off 2 days ago and created large skin tear X 2 on arm. Since, swelling, pain and foul smelling odor to areas of skin tear. Oozing when this RN assesses site.

## 2017-07-28 NOTE — ED Notes (Signed)
ED Provider at bedside. 

## 2017-08-01 ENCOUNTER — Encounter: Payer: Self-pay | Admitting: Family Medicine

## 2017-08-01 ENCOUNTER — Ambulatory Visit (INDEPENDENT_AMBULATORY_CARE_PROVIDER_SITE_OTHER): Payer: Medicare Other | Admitting: Family Medicine

## 2017-08-01 DIAGNOSIS — L03114 Cellulitis of left upper limb: Secondary | ICD-10-CM

## 2017-08-01 DIAGNOSIS — L0390 Cellulitis, unspecified: Secondary | ICD-10-CM | POA: Insufficient documentation

## 2017-08-01 NOTE — Patient Instructions (Signed)
Nice to see you. The area looks to be improving. Please continue the antibiotics. If you develop worsening swelling, pain, redness, or any new or change in symptoms please seek medical attention.

## 2017-08-01 NOTE — Progress Notes (Signed)
  Tommi Rumps, MD Phone: [phone]  Henry Mays is a 76 y.o. male who presents today for follow-up.  Patient was seen in the ED on 07/28/17 for skin tear and cellulitis of the left hand. He noted several days prior to that he cut the dorsal ulnar aspect of his left hand on a piece of cardboard. He put a Band-Aid on it and left it on for several days. When he tried to take the Band-Aid off he developed a skin tear. He subsequently developed swelling in the arm with surrounding erythema. They placed him on Keflex and the patient notes it looks significantly improved. The swelling has gone almost all the way down. There is minimal if any erythema. No significant pain. No fevers. He has a smaller abrasion over the ulnar aspect midforearm with no surrounding erythema or drainage.   ROS see history of present illness  Objective  Physical Exam Vitals:   08/01/17 0821  BP: 118/70  Pulse: 71  Temp: 97.9 F (36.6 C)  SpO2: 98%    BP Readings from Last 3 Encounters:  08/01/17 118/70  07/28/17 (!) 157/69  06/19/17 100/60   Wt Readings from Last 3 Encounters:  08/01/17 157 lb 3.2 oz (71.3 kg)  07/28/17 150 lb (68 kg)  06/19/17 153 lb 3.2 oz (69.5 kg)    Physical Exam  Constitutional: No distress.  Pulmonary/Chest: Effort normal.  Musculoskeletal:       Arms: Skin: He is not diaphoretic.     Assessment/Plan: Please see individual problem list.  Cellulitis Patient with cellulitis related to skin tear. He has done well on Keflex. Appears to be quite a bit improved per patient report. Discussed finishing the course of Keflex. If he has recurrent symptoms or worsening symptoms he'll be reevaluated. Given return precautions.   Tommi Rumps, MD [place]

## 2017-08-01 NOTE — Assessment & Plan Note (Signed)
Patient with cellulitis related to skin tear. He has done well on Keflex. Appears to be quite a bit improved per patient report. Discussed finishing the course of Keflex. If he has recurrent symptoms or worsening symptoms he'll be reevaluated. Given return precautions.

## 2017-09-12 DIAGNOSIS — L821 Other seborrheic keratosis: Secondary | ICD-10-CM | POA: Diagnosis not present

## 2017-09-12 DIAGNOSIS — D1801 Hemangioma of skin and subcutaneous tissue: Secondary | ICD-10-CM | POA: Diagnosis not present

## 2017-09-12 DIAGNOSIS — X32XXXA Exposure to sunlight, initial encounter: Secondary | ICD-10-CM | POA: Diagnosis not present

## 2017-09-12 DIAGNOSIS — Z85828 Personal history of other malignant neoplasm of skin: Secondary | ICD-10-CM | POA: Diagnosis not present

## 2017-09-12 DIAGNOSIS — L570 Actinic keratosis: Secondary | ICD-10-CM | POA: Diagnosis not present

## 2017-09-12 DIAGNOSIS — Z08 Encounter for follow-up examination after completed treatment for malignant neoplasm: Secondary | ICD-10-CM | POA: Diagnosis not present

## 2017-09-17 ENCOUNTER — Ambulatory Visit (INDEPENDENT_AMBULATORY_CARE_PROVIDER_SITE_OTHER): Payer: Medicare Other

## 2017-09-17 DIAGNOSIS — Z23 Encounter for immunization: Secondary | ICD-10-CM | POA: Diagnosis not present

## 2017-10-09 ENCOUNTER — Telehealth: Payer: Self-pay

## 2017-10-09 NOTE — Telephone Encounter (Signed)
Received refill request for tetracaine 2% oral solution Swish and gargle 1 teaspoon full three times daily [place] had this from previous pcp, he uses this as needed when his mouth is sore

## 2017-10-10 ENCOUNTER — Ambulatory Visit (INDEPENDENT_AMBULATORY_CARE_PROVIDER_SITE_OTHER): Payer: Medicare Other | Admitting: *Deleted

## 2017-10-10 DIAGNOSIS — Z23 Encounter for immunization: Secondary | ICD-10-CM

## 2017-10-10 NOTE — Telephone Encounter (Signed)
Left message to return call 

## 2017-10-10 NOTE — Telephone Encounter (Signed)
Pt called returning your call. Thank you! °

## 2017-10-10 NOTE — Progress Notes (Signed)
Patient presented for pneumonia Vaccine , tolerated well,

## 2017-10-10 NOTE — Telephone Encounter (Signed)
Please check with the patient to see what if any symptoms he is having that would require this kind of medication. Thanks.

## 2017-10-11 ENCOUNTER — Ambulatory Visit: Payer: Self-pay | Admitting: *Deleted

## 2017-10-11 NOTE — Telephone Encounter (Signed)
He  has been taking a medication that has to be specially mixed for me.  It's for when my tongue gets sore.   It used to be called Pontacaine but they don't make it anymore.   It was prescribed by his former dr in [place].  He gets it mixed at [facility] in [place] and they have mixed this for him before.  [phone].  I'm going to send a message to Dr. Ellen Henri office.  I instructed pt that I would have them call him.  I spoke with someone in Dr. Ellen Henri office and made them aware of the situation.

## 2017-10-11 NOTE — Telephone Encounter (Signed)
Patient states he uses this when his mouth gets sore and his previous pcp let him have this just in case

## 2017-10-11 NOTE — Telephone Encounter (Signed)
Please call the pharmacy and have them fax [place] a refill request with the appropriate information as I can not find the correct prescription for this in our system. Thanks.

## 2017-10-11 NOTE — Telephone Encounter (Signed)
See other phone note

## 2017-10-11 NOTE — Telephone Encounter (Signed)
Please advise 

## 2017-10-14 ENCOUNTER — Telehealth: Payer: Self-pay | Admitting: Family Medicine

## 2017-10-14 NOTE — Telephone Encounter (Signed)
I have faxed script twice

## 2017-10-14 NOTE — Telephone Encounter (Signed)
Copied from [place] #[id number]. Topic: Inquiry >> Oct 14, 2017  4:11 PM Patrice Paradise wrote: Reason for CRM: Pt has called again about getting his medication refill. Pt is really upset because he has yet to hear back from anyone, he has already called the pharmacy. Please contact pt when RX has been sent to pharmacy.

## 2017-10-14 NOTE — Telephone Encounter (Signed)
Copied from [place] [id number]. Topic: Inquiry >> Oct 14, 2017  1:43 PM Henry Mays, NT wrote: Reason for CRM: pt is calling back about getting his medication refill that he used in [place]. He called on 10/11/17. Pt is getting pretty upset because he has yet to hear back and he has already called the pharmacy. Please contact pt when RX is sent to pharamacy/

## 2017-10-14 NOTE — Telephone Encounter (Signed)
rx was completed and given to [facility] LPN

## 2017-10-14 NOTE — Telephone Encounter (Signed)
Spoke with pharmacy and patient. Pharmacy should be sending over refill request for PCP to sign. Patient would like an update once this is complete.

## 2017-10-15 ENCOUNTER — Other Ambulatory Visit: Payer: Self-pay | Admitting: Family Medicine

## 2017-10-16 NOTE — Telephone Encounter (Signed)
Patient says he takes this for his sore mouth.

## 2017-11-16 DIAGNOSIS — H6122 Impacted cerumen, left ear: Secondary | ICD-10-CM | POA: Diagnosis not present

## 2017-11-25 ENCOUNTER — Other Ambulatory Visit: Payer: Self-pay | Admitting: Family Medicine

## 2017-12-01 IMAGING — MR MR HEAD WO/W CM
10 of 13 series · 35 of 48 positions shown · IV contrast (multihance)
Comparison: CT head without contrast [DATE]

CLINICAL DATA: Abnormal CT of the brain.

EXAM:
MRI HEAD WITHOUT AND WITH CONTRAST
TECHNIQUE: Multiplanar, multiecho pulse sequences of the brain and surrounding
structures were obtained without and with intravenous contrast.
CONTRAST:  14mL MULTIHANCE GADOBENATE DIMEGLUMINE 529 MG/ML IV SOLN

[Series 3: DWI · axial · 4.0mm · 0.94mm/px · z∈[-24,+142]mm · 4 of 45 slices shown (1 of 4)]
[im 1/45]
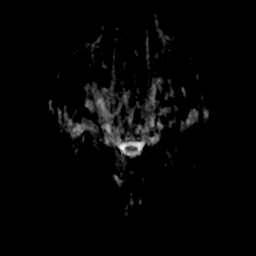
[im 15/45]
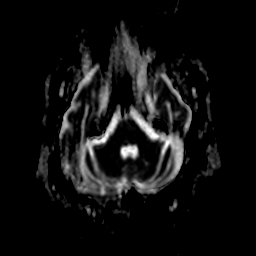
[im 30/45]
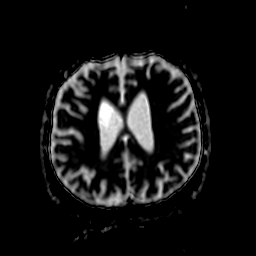
[im 45/45]
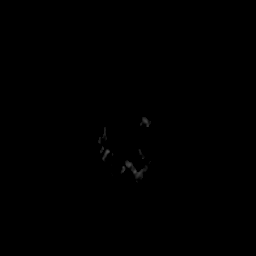

[Series 6: DWI · coronal · 5.0mm · 1.80mm/px · 4 of 38 slices shown (2 of 4)]
[im 1/38]
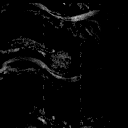
[im 13/38]
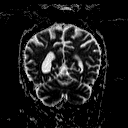
[im 25/38]
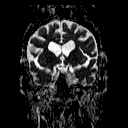
[im 38/38]
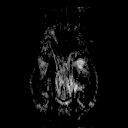

[Series 7: T2 · axial · 5.0mm · 0.45mm/px · z∈[-29,+136]mm · 3 of 28 slices shown (1 of 2)]
[im 1/28]
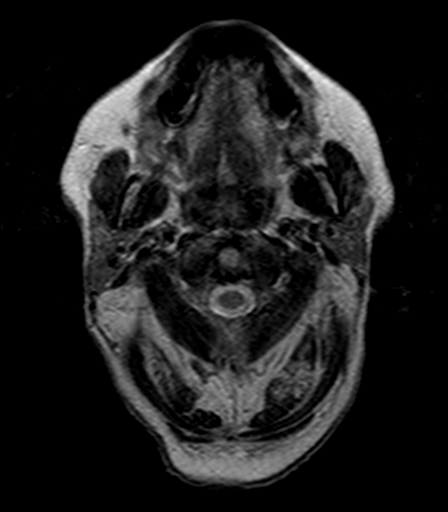
[im 14/28]
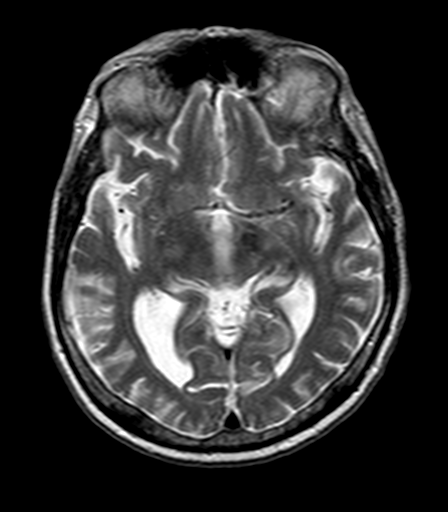
[im 28/28]
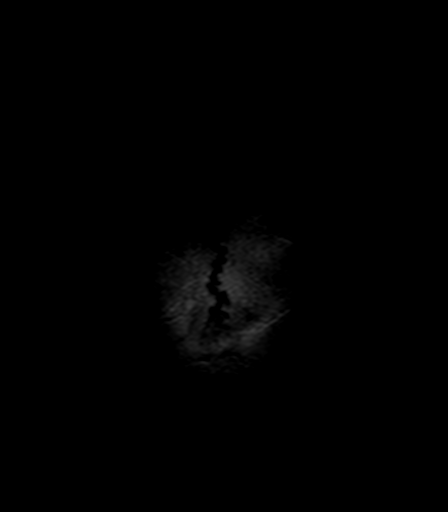

[Series 8: FLAIR · axial · 5.0mm · 0.90mm/px · z∈[-29,+136]mm · 3 of 28 slices shown]
[im 1/28]
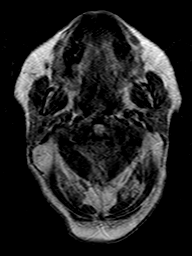
[im 14/28]
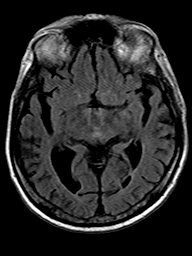
[im 28/28]
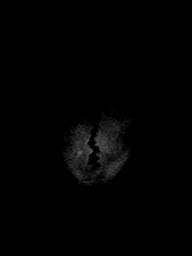

[Series 9: T2 · axial · 5.0mm · 0.45mm/px · z∈[-29,+51]mm · 2 of 28 slices shown (2 of 2)]
[im 1/28]
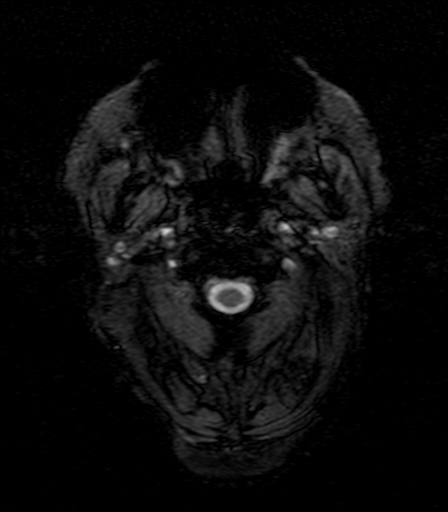
[im 14/28]
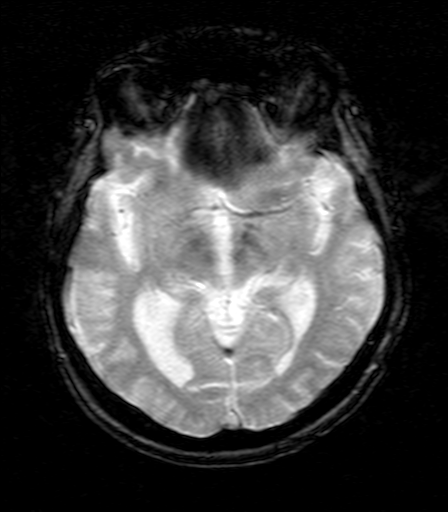

[Series 14: T1 post-contrast · axial · 3.0mm · 0.45mm/px · z∈[-39,+129]mm · 6 of 60 slices shown (1 of 3)]
[im 1/60]
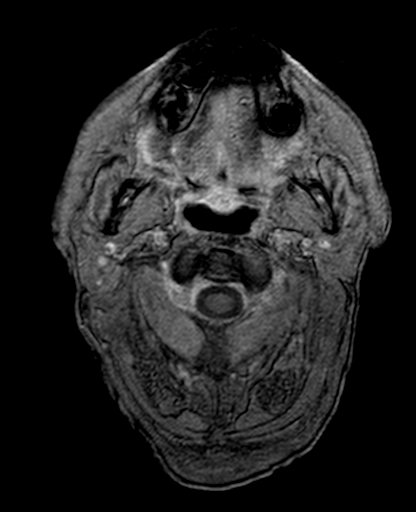
[im 12/60]
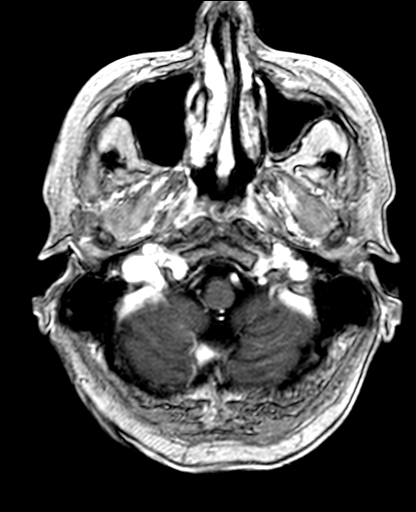
[im 24/60]
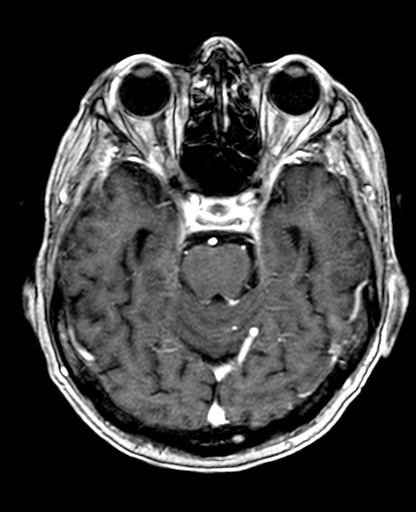
[im 36/60]
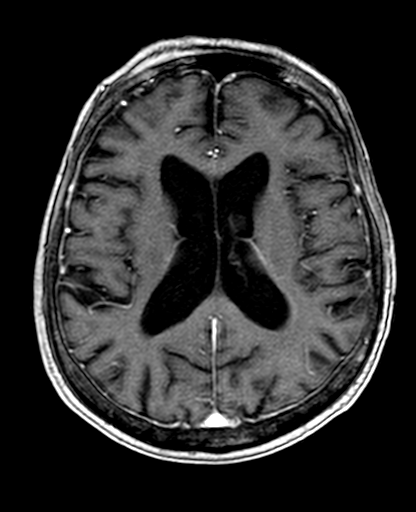
[im 48/60]
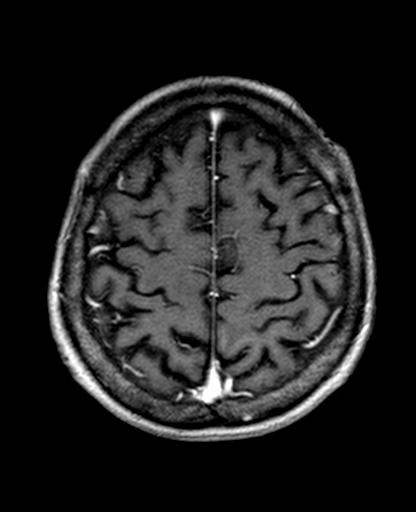
[im 60/60]
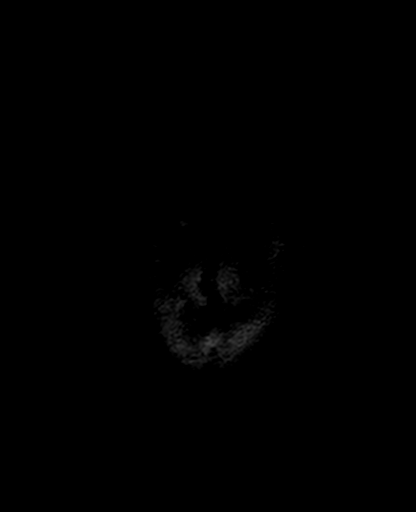

[Series 15: T1 post-contrast · coronal · 5.0mm · 0.45mm/px · 3 of 31 slices shown (2 of 3)]
[im 1/31]
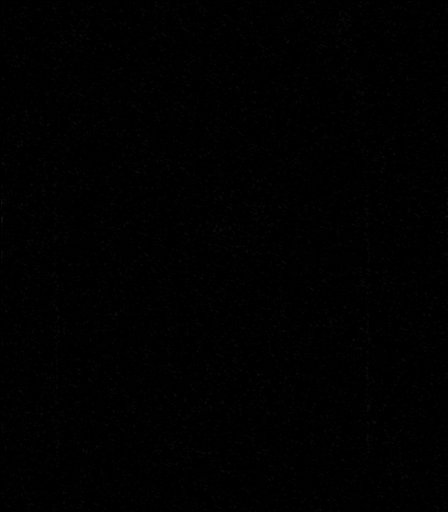
[im 16/31]
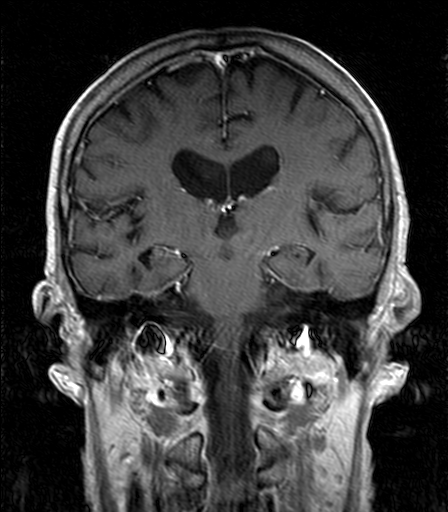
[im 31/31]
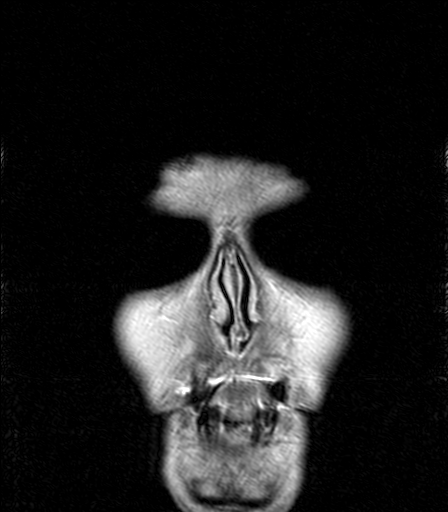

[Series 16: DWI · axial · 4.0mm · 0.94mm/px · z∈[-24,+139]mm · 4 of 42 slices shown (3 of 4)]
[im 1/42]
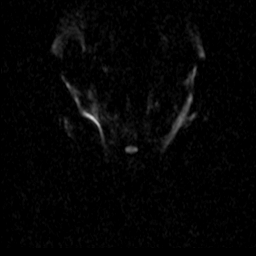
[im 14/42]
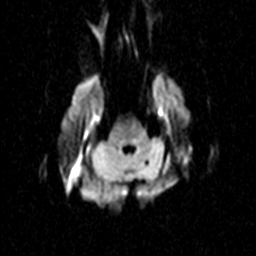
[im 28/42]
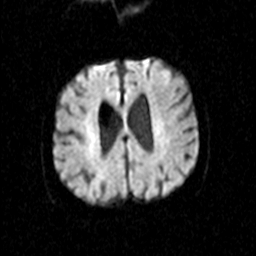
[im 42/42]
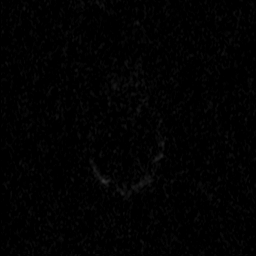

[Series 17: DWI · coronal · 5.0mm · 1.80mm/px · 3 of 34 slices shown (4 of 4)]
[im 1/34]
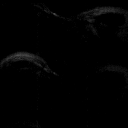
[im 17/34]
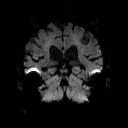
[im 34/34]
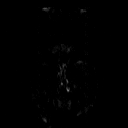

[Series 18: T1 post-contrast · sagittal · 5.0mm · 0.45mm/px · 3 of 31 slices shown (3 of 3)]
[im 1/31]
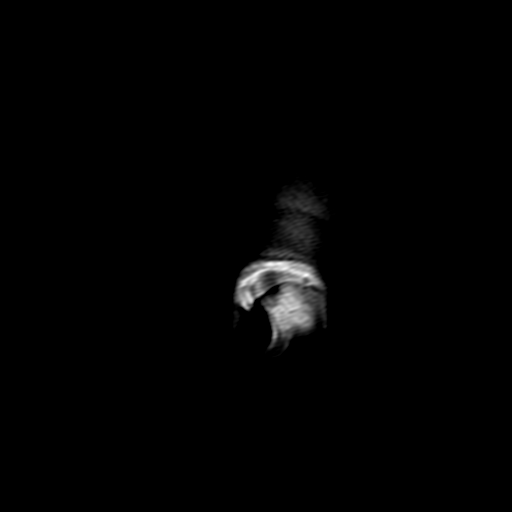
[im 16/31]
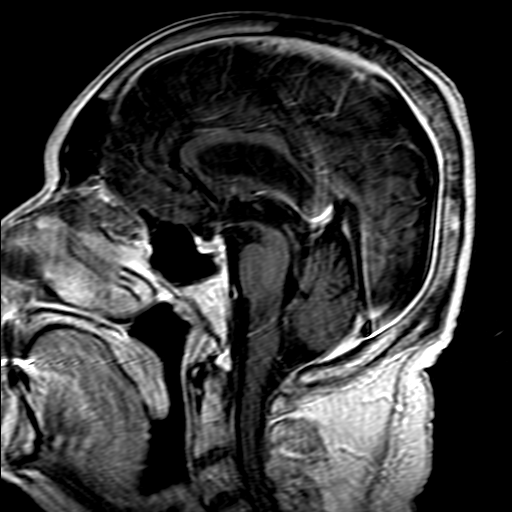
[im 31/31]
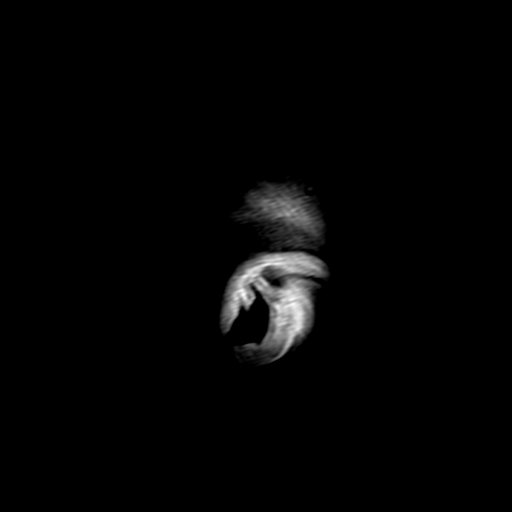

[35 of 48 positions shown; findings below may reference images not displayed]

FINDINGS: Brain: A well-defined nonenhancing T1 hypodense lesion is present at
the foramen of Kian Siong. The lesion measures 10 x 15 x 11 mm. There is
no associated hydrocephalus.

Mild generalized atrophy is within normal limits for age. There is
no significant white matter disease. The ventricles are
proportionate to the degree of atrophy.

The internal auditory canals are within normal limits bilaterally.
The brainstem and cerebellum are normal.

A developmental venous anomaly is present in the left cerebellum. No
pathologic enhancement is present otherwise.

Vascular: Flow is present in the major intracranial arteries.

Skull and upper cervical spine: The skullbase is unremarkable.
Midline sagittal images demonstrate a 10 mm benign appearing
nonenhancing pineal cyst.

The upper cervical spine is within normal limits.

Sinuses/Orbits: The a small fluid levels present in the left
maxillary sinus. Mucosal thickening is present along the floor of
the left maxillary sinus. There is minimal mucosal thickening with
an anterior left ethmoid air cells as well. The left sphenoid sinus
is opacified. No significant right-sided sinus disease is present.
The mastoid air cells are clear bilaterally.

Other:
IMPRESSION: 1. 10 x 15 x 11 mm nonenhancing lesion at the foramen of Kian Siong
extending into the left lateral ventricle is compatible with a
colloid cyst.
2. No associated hydrocephalus.
3. Mild atrophy is likely within normal limits for age without
significant white matter disease.
4. Asymmetric left-sided sinus disease as described.
5. Benign appearing pineal cyst.  No follow-up is necessary.

## 2018-01-03 ENCOUNTER — Telehealth: Payer: Self-pay | Admitting: Family Medicine

## 2018-01-03 NOTE — Telephone Encounter (Signed)
Reverification submitted for prolia

## 2018-01-15 ENCOUNTER — Ambulatory Visit (INDEPENDENT_AMBULATORY_CARE_PROVIDER_SITE_OTHER): Payer: Medicare Other | Admitting: *Deleted

## 2018-01-15 DIAGNOSIS — M810 Age-related osteoporosis without current pathological fracture: Secondary | ICD-10-CM | POA: Diagnosis not present

## 2018-01-15 MED ORDER — DENOSUMAB 60 MG/ML ~~LOC~~ SOLN
60.0000 mg | Freq: Once | SUBCUTANEOUS | Status: AC
Start: 2018-01-15 — End: 2018-01-15
  Administered 2018-01-15: 60 mg via SUBCUTANEOUS

## 2018-01-15 NOTE — Progress Notes (Signed)
I have reviewed the above note and agree.  Kerina Simoneau, M.D.  

## 2018-01-15 NOTE — Progress Notes (Signed)
Patient presented for Prolia injection to left arm [place], patient voiced no concerns or complaints during or after injection. 

## 2018-02-25 ENCOUNTER — Other Ambulatory Visit: Payer: Self-pay | Admitting: Family Medicine

## 2018-02-26 ENCOUNTER — Telehealth: Payer: Self-pay | Admitting: Family Medicine

## 2018-02-26 NOTE — Telephone Encounter (Signed)
Last OV 08/01/17 last filled Dexamethasone/07/18 474 ml 0rf Lisinopril 11/26/17 30 2rf

## 2018-02-26 NOTE — Telephone Encounter (Signed)
Patient requested refills. He needs a follow-up visit in the next 1-2 months for his BP. Thanks.

## 2018-02-27 NOTE — Telephone Encounter (Signed)
scheduled

## 2018-03-13 DIAGNOSIS — Z85828 Personal history of other malignant neoplasm of skin: Secondary | ICD-10-CM | POA: Diagnosis not present

## 2018-03-13 DIAGNOSIS — Z08 Encounter for follow-up examination after completed treatment for malignant neoplasm: Secondary | ICD-10-CM | POA: Diagnosis not present

## 2018-03-13 DIAGNOSIS — L821 Other seborrheic keratosis: Secondary | ICD-10-CM | POA: Diagnosis not present

## 2018-03-13 DIAGNOSIS — X32XXXA Exposure to sunlight, initial encounter: Secondary | ICD-10-CM | POA: Diagnosis not present

## 2018-03-13 DIAGNOSIS — L570 Actinic keratosis: Secondary | ICD-10-CM | POA: Diagnosis not present

## 2018-04-01 ENCOUNTER — Other Ambulatory Visit: Payer: Self-pay

## 2018-04-01 ENCOUNTER — Ambulatory Visit (INDEPENDENT_AMBULATORY_CARE_PROVIDER_SITE_OTHER): Payer: Medicare Other | Admitting: Family Medicine

## 2018-04-01 ENCOUNTER — Encounter: Payer: Self-pay | Admitting: Family Medicine

## 2018-04-01 VITALS — BP 124/60 | HR 99 | Temp 98.3°F | Ht 68.0 in | Wt 144.2 lb

## 2018-04-01 DIAGNOSIS — Q046 Congenital cerebral cysts: Secondary | ICD-10-CM | POA: Diagnosis not present

## 2018-04-01 DIAGNOSIS — K009 Disorder of tooth development, unspecified: Secondary | ICD-10-CM | POA: Insufficient documentation

## 2018-04-01 DIAGNOSIS — D472 Monoclonal gammopathy: Secondary | ICD-10-CM

## 2018-04-01 DIAGNOSIS — I10 Essential (primary) hypertension: Secondary | ICD-10-CM

## 2018-04-01 DIAGNOSIS — D649 Anemia, unspecified: Secondary | ICD-10-CM

## 2018-04-01 NOTE — Assessment & Plan Note (Signed)
Patient at work on his gums.  He is wearing dentures.  He is getting back to where he can eat more now.  I suspect this is playing a role into him feeling unsteady and overall weak.  We will check lab work.  Encouraged p.o. intake.

## 2018-04-01 NOTE — Assessment & Plan Note (Signed)
Refer to local neurology.  MRI ordered to follow-up on cyst.

## 2018-04-01 NOTE — Assessment & Plan Note (Signed)
Refer to local oncologist.

## 2018-04-01 NOTE — Progress Notes (Signed)
Tommi Rumps, MD Phone: [phone]  Henry Mays is a 77 y.o. male who presents today for f/u.  HYPERTENSION  Disease Monitoring  Home BP Monitoring not checking recently though previously 112-115/60-70 Chest pain- no    Dyspnea- no Medications  Compliance-  Taking lisinopril. Lightheadedness-  no  Edema- no  Patient reports he had oral surgery several weeks ago in preparation for implants.  He has not been eating very many solids recently given this and feels he has lost some weight related to that.  He has had a good appetite and is starting to eat more solids.  He has felt a little weaker and unsteady at times since he has not been eating as well.  Notes a little bit of loose stool this morning with a lot of gas.  Otherwise no diarrhea.  Patient would like a referral to a local oncologist given his history of MGUS.  He would like to be followed locally.  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  He was following with neurology at [facility] though wants to see somebody locally.  He notes no headaches or vision changes.    Social History   Tobacco Use  Smoking Status Former Smoker  Smokeless Tobacco Never Used     ROS see history of present illness  Objective  Physical Exam Vitals:   04/01/18 1447 04/01/18 1501  BP: (!) 102/58 124/60  Pulse: 99   Temp: 98.3 F (36.8 C)   SpO2: 98%     BP Readings from Last 3 Encounters:  04/01/18 124/60  08/01/17 118/70  07/28/17 (!) 157/69   Wt Readings from Last 3 Encounters:  04/01/18 144 lb 3.2 oz (65.4 kg)  08/01/17 157 lb 3.2 oz (71.3 kg)  07/28/17 150 lb (68 kg)    Physical Exam  Constitutional: No distress.  HENT:  Head: Normocephalic and atraumatic.  Mouth/Throat: Oropharynx is clear and moist.  Upper denture in place  Cardiovascular: Normal rate, regular rhythm and normal heart sounds.  Pulmonary/Chest: Effort normal and breath sounds normal.  Musculoskeletal: He exhibits no edema.  Neurological: He is  alert.  CN 2-12 intact, 5/5 strength in bilateral biceps, triceps, grip, quads, hamstrings, plantar and dorsiflexion, sensation to light touch intact in bilateral UE and LE, slightly unsteady gait, normal finger to nose, normal rapid alternating movements  Skin: Skin is warm and dry. He is not diaphoretic.     Assessment/Plan: Please see individual problem list.  Colloid cyst of brain [facility]) Refer to local neurology.  MRI ordered to follow-up on cyst.  MGUS (monoclonal gammopathy of unknown significance) Refer to local oncologist.  Essential hypertension Blood pressure is borderline low.  He has felt a little unsteady which may be related to his poor p.o. intake.  We will discontinue lisinopril.  He will return in 1 week for BP check.  Dental anomaly Patient at work on his gums.  He is wearing dentures.  He is getting back to where he can eat more now.  I suspect this is playing a role into him feeling unsteady and overall weak.  We will check lab work.  Encouraged p.o. intake.   Orders Placed This Encounter  Procedures  . MR Brain W Wo Contrast    Standing Status:   Future    Standing Expiration Date:   06/02/2019    Order Specific Question:   If indicated for the ordered procedure, I authorize the administration of contrast media per Radiology protocol    Answer:  Yes    Order Specific Question:   What is the patient's sedation requirement?    Answer:   No Sedation    Order Specific Question:   Does the patient have a pacemaker or implanted devices?    Answer:   No    Order Specific Question:   Radiology Contrast Protocol - do NOT remove file path    Answer:   \\[email or web address]    Order Specific Question:   Preferred imaging location?    Answer:   [facility] (table limit-300lbs)  . Comp Met (CMET)  . CBC  . Ambulatory referral to Neurology    Referral Priority:   Routine    Referral Type:   Consultation    Referral Reason:   Specialty  Services Required    Requested Specialty:   Neurology    Number of Visits Requested:   1  . Ambulatory referral to Oncology    Referral Priority:   Routine    Referral Type:   Consultation    Referral Reason:   Specialty Services Required    Number of Visits Requested:   1    No orders of the defined types were placed in this encounter.    Tommi Rumps, MD [place]

## 2018-04-01 NOTE — Patient Instructions (Signed)
Nice to see you. Please try discontinuing your lisinopril for now to see if that helps with your blood pressure and unsteadiness. We will get you set up for an MRI of your brain. We will get you referred to oncology and neurology in [place]. Please try to increase your oral intake.

## 2018-04-01 NOTE — Assessment & Plan Note (Signed)
Blood pressure is borderline low.  He has felt a little unsteady which may be related to his poor p.o. intake.  We will discontinue lisinopril.  He will return in 1 week for BP check.

## 2018-04-02 ENCOUNTER — Other Ambulatory Visit: Payer: Self-pay | Admitting: Family Medicine

## 2018-04-02 DIAGNOSIS — R7309 Other abnormal glucose: Secondary | ICD-10-CM

## 2018-04-02 DIAGNOSIS — R945 Abnormal results of liver function studies: Principal | ICD-10-CM

## 2018-04-02 DIAGNOSIS — R7989 Other specified abnormal findings of blood chemistry: Secondary | ICD-10-CM

## 2018-04-02 LAB — COMPREHENSIVE METABOLIC PANEL
ALK PHOS: 70 U/L (ref 39–117)
ALT: 66 U/L — AB (ref 0–53)
AST: 89 U/L — ABNORMAL HIGH (ref 0–37)
Albumin: 3.5 g/dL (ref 3.5–5.2)
BILIRUBIN TOTAL: 0.6 mg/dL (ref 0.2–1.2)
BUN: 33 mg/dL — AB (ref 6–23)
CO2: 26 meq/L (ref 19–32)
Calcium: 9.2 mg/dL (ref 8.4–10.5)
Chloride: 99 mEq/L (ref 96–112)
Creatinine, Ser: 1.33 mg/dL (ref 0.40–1.50)
GFR: 55.5 mL/min — AB (ref >60.00)
Glucose, Bld: 112 mg/dL — ABNORMAL HIGH (ref 70–99)
POTASSIUM: 4.7 meq/L (ref 3.5–5.1)
Sodium: 133 mEq/L — ABNORMAL LOW (ref 135–145)
TOTAL PROTEIN: 7.9 g/dL (ref 6.0–8.3)

## 2018-04-02 NOTE — Progress Notes (Signed)
cmetcm

## 2018-04-03 ENCOUNTER — Encounter (INDEPENDENT_AMBULATORY_CARE_PROVIDER_SITE_OTHER): Payer: Self-pay

## 2018-04-04 ENCOUNTER — Inpatient Hospital Stay: Payer: Medicare Other | Attending: Oncology | Admitting: Oncology

## 2018-04-04 ENCOUNTER — Encounter: Payer: Self-pay | Admitting: Oncology

## 2018-04-04 ENCOUNTER — Inpatient Hospital Stay: Payer: Medicare Other

## 2018-04-04 VITALS — BP 138/73 | HR 78 | Temp 98.1°F | Resp 18 | Ht 68.0 in | Wt 143.0 lb

## 2018-04-04 DIAGNOSIS — I10 Essential (primary) hypertension: Secondary | ICD-10-CM | POA: Diagnosis not present

## 2018-04-04 DIAGNOSIS — R740 Nonspecific elevation of levels of transaminase and lactic acid dehydrogenase [LDH]: Secondary | ICD-10-CM | POA: Diagnosis not present

## 2018-04-04 DIAGNOSIS — Z87891 Personal history of nicotine dependence: Secondary | ICD-10-CM | POA: Insufficient documentation

## 2018-04-04 DIAGNOSIS — D472 Monoclonal gammopathy: Secondary | ICD-10-CM

## 2018-04-04 DIAGNOSIS — Z85828 Personal history of other malignant neoplasm of skin: Secondary | ICD-10-CM | POA: Insufficient documentation

## 2018-04-04 DIAGNOSIS — N179 Acute kidney failure, unspecified: Secondary | ICD-10-CM | POA: Insufficient documentation

## 2018-04-04 LAB — COMPREHENSIVE METABOLIC PANEL
ALK PHOS: 74 U/L (ref 38–126)
ALT: 57 U/L (ref 17–63)
AST: 61 U/L — AB (ref 15–41)
Albumin: 3.5 g/dL (ref 3.5–5.0)
Anion gap: 12 (ref 5–15)
BUN: 16 mg/dL (ref 6–20)
CALCIUM: 9.4 mg/dL (ref 8.9–10.3)
CHLORIDE: 97 mmol/L — AB (ref 101–111)
CO2: 25 mmol/L (ref 22–32)
Creatinine, Ser: 0.97 mg/dL (ref 0.61–1.24)
GFR calc Af Amer: >60 mL/min (ref >60)
GFR calc non Af Amer: >60 mL/min (ref >60)
Glucose, Bld: 131 mg/dL — ABNORMAL HIGH (ref 65–99)
Potassium: 4.6 mmol/L (ref 3.5–5.1)
SODIUM: 134 mmol/L — AB (ref 135–145)
Total Bilirubin: 1.1 mg/dL (ref 0.3–1.2)
Total Protein: 8.4 g/dL — ABNORMAL HIGH (ref 6.5–8.1)

## 2018-04-04 LAB — CBC WITH DIFFERENTIAL/PLATELET
BASOS ABS: 0 10*3/uL (ref 0–0.1)
Basophils Relative: 1 %
EOS ABS: 0 10*3/uL (ref 0–0.7)
Eosinophils Relative: 0 %
HCT: 35.9 % — ABNORMAL LOW (ref 40.0–52.0)
HEMOGLOBIN: 12.4 g/dL — AB (ref 13.0–18.0)
LYMPHS ABS: 1.2 10*3/uL (ref 1.0–3.6)
LYMPHS PCT: 13 %
MCH: 32.8 pg (ref 26.0–34.0)
MCHC: 34.6 g/dL (ref 32.0–36.0)
MCV: 94.8 fL (ref 80.0–100.0)
Monocytes Absolute: 1.1 10*3/uL — ABNORMAL HIGH (ref 0.2–1.0)
Monocytes Relative: 13 %
NEUTROS PCT: 73 %
Neutro Abs: 6.5 10*3/uL (ref 1.4–6.5)
PLATELETS: 217 10*3/uL (ref 150–440)
RBC: 3.79 MIL/uL — AB (ref 4.40–5.90)
RDW: 13.3 % (ref 11.5–14.5)
WBC: 8.9 10*3/uL (ref 3.8–10.6)

## 2018-04-04 NOTE — Progress Notes (Signed)
Hematology/Oncology Consult note [facility] Telephone:([phone] Fax:([phone]  Patient Care Team: Leone Haven, MD as PCP - General (Family Medicine)   Name of the patient: Henry Mays  [id number]  [birth year]    Reason for referral- h/o MGUS   Referring physician- dr. Caryl Bis  Date of visit: 04/04/18   History of presenting illness-patient is a 77 year old male with a past medical history significant for Cholinoid cysts of the brain and MGUS among other medical problems.  He has chronic problems with balance issues.  He did sustain a fall about 2 years ago and fractured his right arm and has limited mobility in his right arm.  He was seen by a hematologist in [facility] and was diagnosed with MGUS and had a yearly follow-up for that.  He has now moved to [facility] and has been referred to [place] for continued management of MGUS.  I do not have his outside records from [facility] at this time  He is doing well and denies any changes in his appetite or unintentional weight loss or aches or pains anywhere.  ECOG PS- 1  Pain scale- 0   Review of systems- Review of Systems  Constitutional: Negative for chills, fever, malaise/fatigue and weight loss.  HENT: Negative for congestion, ear discharge and nosebleeds.   Eyes: Negative for blurred vision.  Respiratory: Negative for cough, hemoptysis, sputum production, shortness of breath and wheezing.   Cardiovascular: Negative for chest pain, palpitations, orthopnea and claudication.  Gastrointestinal: Negative for abdominal pain, blood in stool, constipation, diarrhea, heartburn, melena, nausea and vomiting.  Genitourinary: Negative for dysuria, flank pain, frequency, hematuria and urgency.  Musculoskeletal: Negative for back pain, joint pain and myalgias.  Skin: Negative for rash.  Neurological: Negative for dizziness, tingling, focal weakness, seizures, weakness and headaches.   Problems with balance  Endo/Heme/Allergies: Does not bruise/bleed easily.  Psychiatric/Behavioral: Negative for depression and suicidal ideas. The patient does not have insomnia.     No Known Allergies  Patient Active Problem List   Diagnosis Date Noted  . Dental anomaly 04/01/2018  . Plantar wart of right foot 05/14/2017  . Fall 10/24/2016  . Orthostatic hypotension 08/28/2016  . PVC (premature ventricular contraction) 08/28/2016  . Near syncope   . Intractable hiccups 08/07/2016  . Colloid cyst of brain ([place]) 05/22/2016  . Osteoporosis 05/22/2016  . Decreased right shoulder range of motion 04/10/2016  . Essential hypertension 04/10/2016  . MGUS (monoclonal gammopathy of unknown significance) 04/10/2016     Past Medical History:  Diagnosis Date  . Arthritis   . Basal cell carcinoma   . Chickenpox   . History of Clostridium difficile infection   . Hypertension      Past Surgical History:  Procedure Laterality Date  . BASAL CELL CARCINOMA EXCISION    . HERNIA REPAIR      Social History   Socioeconomic History  . Marital status: Married    Spouse name: Not on file  . Number of children: Not on file  . Years of education: Not on file  . Highest education level: Not on file  Occupational History  . Not on file  Social Needs  . Financial resource strain: Not on file  . Food insecurity:    Worry: Not on file    Inability: Not on file  . Transportation needs:    Medical: Not on file    Non-medical: Not on file  Tobacco Use  . Smoking status: Former Research scientist (life sciences)  .  Smokeless tobacco: Never Used  Substance and Sexual Activity  . Alcohol use: Yes    Alcohol/week: 6.0 oz    Types: 10 Standard drinks or equivalent per week    Comment: Not drinking any alcohol since c diff diagnosis  . Drug use: No  . Sexual activity: Not on file  Lifestyle  . Physical activity:    Days per week: Not on file    Minutes per session: Not on file  . Stress: Not on file    Relationships  . Social connections:    Talks on phone: Not on file    Gets together: Not on file    Attends religious service: Not on file    Active member of club or organization: Not on file    Attends meetings of clubs or organizations: Not on file    Relationship status: Not on file  . Intimate partner violence:    Fear of current or ex partner: Not on file    Emotionally abused: Not on file    Physically abused: Not on file    Forced sexual activity: Not on file  Other Topics Concern  . Not on file  Social History Narrative  . Not on file     Family History  Problem Relation Age of Onset  . Arthritis Unknown   . Stroke Unknown   . Hypertension Unknown   . Leukemia Father   . CAD Mother   . Prostate cancer Neg Hx   . Bladder Cancer Neg Hx   . Kidney cancer Neg Hx      Current Outpatient Medications:  .  lisinopril (PRINIVIL,ZESTRIL) 5 MG tablet, TAKE ONE TABLET BY MOUTH EVERY DAY, Disp: 30 tablet, Rfl: 2 .  dexamethasone 0.5 MG/5ML elixir, TAKE 2 TEASPOONSFUL BY MOUTH 3 TIMES DAILY, Disp: 474 mL, Rfl: 0   Physical exam:  Vitals:   04/04/18 1101  BP: 138/73  Pulse: 78  Resp: 18  Temp: 98.1 F (36.7 C)  TempSrc: Tympanic  SpO2: 99%  Weight: 143 lb (64.9 kg)  Height: 5' 8"  (1.727 m)   Physical Exam  Constitutional: He is oriented to person, place, and time.  Thin elderly gentleman in no acute distress  HENT:  Head: Normocephalic and atraumatic.  Eyes: Pupils are equal, round, and reactive to light. EOM are normal.  Neck: Normal range of motion.  Cardiovascular: Normal rate, regular rhythm and normal heart sounds.  Pulmonary/Chest: Effort normal and breath sounds normal.  Abdominal: Soft. Bowel sounds are normal.  Neurological: He is alert and oriented to person, place, and time.  Skin: Skin is warm and dry.  Scattered bruising noted over bilateral dorsal surface of his hands       CMP Latest Ref Rng & Units 04/01/2018  Glucose 70 - 99 mg/dL  112(H)  BUN 6 - 23 mg/dL 33(H)  Creatinine 0.40 - 1.50 mg/dL 1.33  Sodium 135 - 145 mEq/L 133(L)  Potassium 3.5 - 5.1 mEq/L 4.7  Chloride 96 - 112 mEq/L 99  CO2 19 - 32 mEq/L 26  Calcium 8.4 - 10.5 mg/dL 9.2  Total Protein 6.0 - 8.3 g/dL 7.9  Total Bilirubin 0.2 - 1.2 mg/dL 0.6  Alkaline Phos 39 - 117 U/L 70  AST 0 - 37 U/L 89(H)  ALT 0 - 53 U/L 66(H)   CBC Latest Ref Rng & Units 04/04/2018  WBC 3.8 - 10.6 K/uL 8.9  Hemoglobin 13.0 - 18.0 g/dL 12.4(L)  Hematocrit 40.0 - 52.0 % 35.9(L)  Platelets 150 - 440 K/uL 217     Assessment and plan- Patient is a 77 y.o. male referred for history of MGUS  Today I will check CBC with differential, CMP, myeloma panel, serum free light chains as well as random urine protein electrophoresis.  He did have mild AKI with a creatinine of 1.3 and mildly elevated AST ALT noted on recent labs.  I will also obtain outside records from [facility].  Discussed natural history of MGUS and risk of progression to overt multiple myeloma which is about 1 %/year.  I will see him back in 1 years time and repeat the same labs that I am doing today but a week prior.  His MGUS can be followed conservatively and does not require any active treatment unless there is any concern for overt progression to multiple myeloma   Thank you for this kind referral and the opportunity to participate in the care of this patient   Visit Diagnosis 1. MGUS (monoclonal gammopathy of unknown significance)     Dr. Randa Evens, MD, MPH [facility] at [facility] [phone] 04/04/2018 12:17 PM

## 2018-04-07 LAB — KAPPA/LAMBDA LIGHT CHAINS
KAPPA FREE LGHT CHN: 63.9 mg/L — AB (ref 3.3–19.4)
Kappa, lambda light chain ratio: 1.22 (ref 0.26–1.65)
Lambda free light chains: 52.2 mg/L — ABNORMAL HIGH (ref 5.7–26.3)

## 2018-04-07 LAB — PROTEIN ELECTRO, RANDOM URINE
ALBUMIN ELP UR: 22.8 %
ALPHA-2-GLOBULIN, U: 11.7 %
Alpha-1-Globulin, U: 10.8 %
Beta Globulin, U: 23.2 %
Gamma Globulin, U: 31.5 %
TOTAL PROTEIN, URINE-UPE24: 17.7 mg/dL

## 2018-04-08 ENCOUNTER — Ambulatory Visit: Payer: Medicare Other

## 2018-04-08 VITALS — BP 108/62 | HR 89

## 2018-04-08 DIAGNOSIS — I10 Essential (primary) hypertension: Secondary | ICD-10-CM

## 2018-04-08 LAB — MULTIPLE MYELOMA PANEL, SERUM
ALBUMIN SERPL ELPH-MCNC: 3.2 g/dL (ref 2.9–4.4)
ALPHA 1: 0.3 g/dL (ref 0.0–0.4)
ALPHA2 GLOB SERPL ELPH-MCNC: 0.8 g/dL (ref 0.4–1.0)
Albumin/Glob SerPl: 0.7 (ref 0.7–1.7)
B-Globulin SerPl Elph-Mcnc: 1.1 g/dL (ref 0.7–1.3)
Gamma Glob SerPl Elph-Mcnc: 2.4 g/dL — ABNORMAL HIGH (ref 0.4–1.8)
Globulin, Total: 4.7 g/dL — ABNORMAL HIGH (ref 2.2–3.9)
IGA: 800 mg/dL — AB (ref 61–437)
IGG (IMMUNOGLOBIN G), SERUM: 2064 mg/dL — AB (ref 700–1600)
IgM (Immunoglobulin M), Srm: 83 mg/dL (ref 15–143)
TOTAL PROTEIN ELP: 7.9 g/dL (ref 6.0–8.5)

## 2018-04-08 NOTE — Progress Notes (Signed)
BP is adequately controlled. He does not need to be on medications. Please see if he has remained unsteady after we stopped his BP medication.

## 2018-04-08 NOTE — Progress Notes (Signed)
Patient was seen for nurse visit today per order to have BP checked in one week. Patient does not check at home and is currently not on any meds for BP.  BP left arm: 108/62 Pulse: 89 O2; 97  BP right arm: 106/62 Pulse: 86 O2: 97

## 2018-04-09 ENCOUNTER — Ambulatory Visit
Admission: RE | Admit: 2018-04-09 | Discharge: 2018-04-09 | Disposition: A | Discharge status: Home / Self Care | Payer: Medicare Other | Source: Ambulatory Visit | Attending: Family Medicine | Admitting: Family Medicine

## 2018-04-09 DIAGNOSIS — Q046 Congenital cerebral cysts: Secondary | ICD-10-CM | POA: Insufficient documentation

## 2018-04-09 DIAGNOSIS — G9389 Other specified disorders of brain: Secondary | ICD-10-CM | POA: Diagnosis not present

## 2018-04-09 MED ORDER — GADOBENATE DIMEGLUMINE 529 MG/ML IV SOLN
15.0000 mL | Freq: Once | INTRAVENOUS | Status: AC | PRN
  Administered 2018-04-09: 13 mL via INTRAVENOUS

## 2018-04-09 NOTE — Progress Notes (Signed)
Patient's wife whom is on DPR notified of Dr.Sonnenbergs recommendations and verbalized understanding.

## 2018-04-11 ENCOUNTER — Telehealth: Payer: Self-pay | Admitting: Family Medicine

## 2018-04-11 NOTE — Telephone Encounter (Signed)
Advised patient yes due to PCP has an A1c ordered and that was not in the labs from cancer center.

## 2018-04-11 NOTE — Telephone Encounter (Signed)
Copied from [place] [id number]. Topic: Quick Communication - See Telephone Encounter >> Apr 11, 2018  3:23 PM Vernona Rieger wrote: CRM for notification. See Telephone encounter for: 04/11/18.  Patient's wife states that he is suppose to come in on Monday 5/6 for blood work. She said that he had blood work on 4/26 at the cancer center, she wants to know is it necessary for him to still come in on 5/6? Call back [phone] Zigmund Daniel )

## 2018-04-14 ENCOUNTER — Other Ambulatory Visit (INDEPENDENT_AMBULATORY_CARE_PROVIDER_SITE_OTHER): Payer: Medicare Other

## 2018-04-14 ENCOUNTER — Telehealth: Payer: Self-pay

## 2018-04-14 DIAGNOSIS — R945 Abnormal results of liver function studies: Secondary | ICD-10-CM

## 2018-04-14 DIAGNOSIS — R7309 Other abnormal glucose: Secondary | ICD-10-CM | POA: Diagnosis not present

## 2018-04-14 DIAGNOSIS — R7989 Other specified abnormal findings of blood chemistry: Secondary | ICD-10-CM

## 2018-04-14 LAB — COMPREHENSIVE METABOLIC PANEL
ALT: 31 U/L (ref 0–53)
AST: 34 U/L (ref 0–37)
Albumin: 3.4 g/dL — ABNORMAL LOW (ref 3.5–5.2)
Alkaline Phosphatase: 60 U/L (ref 39–117)
BUN: 14 mg/dL (ref 6–23)
CALCIUM: 9.2 mg/dL (ref 8.4–10.5)
CHLORIDE: 99 meq/L (ref 96–112)
CO2: 28 mEq/L (ref 19–32)
Creatinine, Ser: 1 mg/dL (ref 0.40–1.50)
GFR: 77.12 mL/min (ref >60.00)
Glucose, Bld: 124 mg/dL — ABNORMAL HIGH (ref 70–99)
Potassium: 4.2 mEq/L (ref 3.5–5.1)
Sodium: 135 mEq/L (ref 135–145)
Total Bilirubin: 0.7 mg/dL (ref 0.2–1.2)
Total Protein: 7.8 g/dL (ref 6.0–8.3)

## 2018-04-14 LAB — HEMOGLOBIN A1C: HEMOGLOBIN A1C: 5.6 % (ref 4.6–6.5)

## 2018-04-14 NOTE — Telephone Encounter (Signed)
Please advise 

## 2018-04-14 NOTE — Telephone Encounter (Signed)
Patients wife notified

## 2018-04-14 NOTE — Telephone Encounter (Signed)
He should still have labs. One of his liver function tests was still slightly elevated at the cancer center and we should recheck that. Thanks.

## 2018-04-14 NOTE — Telephone Encounter (Signed)
Copied from [place] [phone]. Topic: Quick Communication - See Telephone Encounter >> Apr 11, 2018  3:23 PM Vernona Rieger wrote: CRM for notification. See Telephone encounter for: 04/11/18.  Patient's wife states that he is suppose to come in on Monday 5/6 for blood work. She said that he had blood work on 4/26 at the cancer center, she wants to know is it necessary for him to still come in on 5/6? Call back [phone] Zigmund Daniel )

## 2018-06-04 DIAGNOSIS — H903 Sensorineural hearing loss, bilateral: Secondary | ICD-10-CM | POA: Diagnosis not present

## 2018-06-04 DIAGNOSIS — H6123 Impacted cerumen, bilateral: Secondary | ICD-10-CM | POA: Diagnosis not present

## 2018-07-01 ENCOUNTER — Other Ambulatory Visit: Payer: Self-pay | Admitting: Family Medicine

## 2018-07-01 NOTE — Telephone Encounter (Signed)
Please confirm with the patient what he takes this for.  Thanks.

## 2018-07-01 NOTE — Telephone Encounter (Signed)
Last OV 04/01/18 last filled 02/26/18 474 ml 0rf

## 2018-07-02 NOTE — Telephone Encounter (Signed)
Patient takes this for his mouth sores

## 2018-07-04 ENCOUNTER — Emergency Department: Payer: Medicare Other

## 2018-07-04 ENCOUNTER — Other Ambulatory Visit: Payer: Self-pay

## 2018-07-04 ENCOUNTER — Encounter: Payer: Self-pay | Admitting: Family Medicine

## 2018-07-04 ENCOUNTER — Emergency Department
Admission: EM | Admit: 2018-07-04 | Discharge: 2018-07-04 | Disposition: A | Discharge status: Other Acute Inpatient Hospital | Payer: Medicare Other | Attending: Emergency Medicine | Admitting: Emergency Medicine

## 2018-07-04 ENCOUNTER — Ambulatory Visit (INDEPENDENT_AMBULATORY_CARE_PROVIDER_SITE_OTHER): Payer: Medicare Other | Admitting: Family Medicine

## 2018-07-04 VITALS — BP 102/70 | HR 67 | Temp 97.7°F | Ht 68.0 in | Wt 149.0 lb

## 2018-07-04 DIAGNOSIS — M25561 Pain in right knee: Secondary | ICD-10-CM | POA: Diagnosis not present

## 2018-07-04 DIAGNOSIS — R972 Elevated prostate specific antigen [PSA]: Secondary | ICD-10-CM | POA: Diagnosis not present

## 2018-07-04 DIAGNOSIS — I10 Essential (primary) hypertension: Secondary | ICD-10-CM

## 2018-07-04 DIAGNOSIS — S6291XB Unspecified fracture of right wrist and hand, initial encounter for open fracture: Secondary | ICD-10-CM | POA: Diagnosis not present

## 2018-07-04 DIAGNOSIS — S51011A Laceration without foreign body of right elbow, initial encounter: Secondary | ICD-10-CM | POA: Diagnosis not present

## 2018-07-04 DIAGNOSIS — M7989 Other specified soft tissue disorders: Secondary | ICD-10-CM | POA: Diagnosis not present

## 2018-07-04 DIAGNOSIS — K1379 Other lesions of oral mucosa: Secondary | ICD-10-CM | POA: Diagnosis not present

## 2018-07-04 DIAGNOSIS — S59801A Other specified injuries of right elbow, initial encounter: Secondary | ICD-10-CM | POA: Diagnosis not present

## 2018-07-04 DIAGNOSIS — Z87891 Personal history of nicotine dependence: Secondary | ICD-10-CM | POA: Insufficient documentation

## 2018-07-04 DIAGNOSIS — S6991XA Unspecified injury of right wrist, hand and finger(s), initial encounter: Secondary | ICD-10-CM | POA: Diagnosis not present

## 2018-07-04 DIAGNOSIS — Z13220 Encounter for screening for lipoid disorders: Secondary | ICD-10-CM

## 2018-07-04 DIAGNOSIS — S0990XA Unspecified injury of head, initial encounter: Secondary | ICD-10-CM | POA: Diagnosis not present

## 2018-07-04 DIAGNOSIS — Y999 Unspecified external cause status: Secondary | ICD-10-CM | POA: Insufficient documentation

## 2018-07-04 DIAGNOSIS — S0231XA Fracture of orbital floor, right side, initial encounter for closed fracture: Secondary | ICD-10-CM | POA: Diagnosis not present

## 2018-07-04 DIAGNOSIS — M810 Age-related osteoporosis without current pathological fracture: Secondary | ICD-10-CM | POA: Diagnosis not present

## 2018-07-04 DIAGNOSIS — Q046 Congenital cerebral cysts: Secondary | ICD-10-CM | POA: Diagnosis not present

## 2018-07-04 DIAGNOSIS — W19XXXA Unspecified fall, initial encounter: Secondary | ICD-10-CM | POA: Diagnosis not present

## 2018-07-04 DIAGNOSIS — Z9989 Dependence on other enabling machines and devices: Secondary | ICD-10-CM | POA: Diagnosis not present

## 2018-07-04 DIAGNOSIS — T148XXA Other injury of unspecified body region, initial encounter: Secondary | ICD-10-CM

## 2018-07-04 DIAGNOSIS — S80211A Abrasion, right knee, initial encounter: Secondary | ICD-10-CM | POA: Diagnosis not present

## 2018-07-04 DIAGNOSIS — S6980XA Other specified injuries of unspecified wrist, hand and finger(s), initial encounter: Secondary | ICD-10-CM | POA: Diagnosis not present

## 2018-07-04 DIAGNOSIS — S199XXA Unspecified injury of neck, initial encounter: Secondary | ICD-10-CM | POA: Diagnosis not present

## 2018-07-04 DIAGNOSIS — Y9289 Other specified places as the place of occurrence of the external cause: Secondary | ICD-10-CM | POA: Insufficient documentation

## 2018-07-04 DIAGNOSIS — W01198A Fall on same level from slipping, tripping and stumbling with subsequent striking against other object, initial encounter: Secondary | ICD-10-CM | POA: Insufficient documentation

## 2018-07-04 DIAGNOSIS — S8991XA Unspecified injury of right lower leg, initial encounter: Secondary | ICD-10-CM | POA: Diagnosis not present

## 2018-07-04 DIAGNOSIS — Y93K1 Activity, walking an animal: Secondary | ICD-10-CM | POA: Insufficient documentation

## 2018-07-04 LAB — LIPID PANEL
CHOLESTEROL: 231 mg/dL — AB (ref 0–200)
HDL: 121.3 mg/dL (ref >39.00)
LDL Cholesterol: 100 mg/dL — ABNORMAL HIGH (ref 0–99)
NonHDL: 109.89
Total CHOL/HDL Ratio: 2
Triglycerides: 47 mg/dL (ref 0.0–149.0)
VLDL: 9.4 mg/dL (ref 0.0–40.0)

## 2018-07-04 LAB — VITAMIN D 25 HYDROXY (VIT D DEFICIENCY, FRACTURES): VITD: 58.61 ng/mL (ref 30.00–100.00)

## 2018-07-04 MED ORDER — BUPIVACAINE HCL (PF) 0.5 % IJ SOLN
30.0000 mL | Freq: Once | INTRAMUSCULAR | Status: AC
  Administered 2018-07-04: 30 mL
  Filled 2018-07-04: qty 30

## 2018-07-04 MED ORDER — HYDROCODONE-ACETAMINOPHEN 5-325 MG PO TABS
1.0000 | ORAL_TABLET | Freq: Once | ORAL | Status: AC
  Administered 2018-07-04: 1 via ORAL
  Filled 2018-07-04: qty 1

## 2018-07-04 MED ORDER — GENTAMICIN SULFATE 40 MG/ML IJ SOLN
2.5000 mg/kg | Freq: Once | INTRAVENOUS | Status: DC
  Filled 2018-07-04: qty 4.25

## 2018-07-04 MED ORDER — IBUPROFEN 600 MG PO TABS
600.0000 mg | ORAL_TABLET | Freq: Once | ORAL | Status: AC
  Administered 2018-07-04: 600 mg via ORAL
  Filled 2018-07-04: qty 1

## 2018-07-04 MED ORDER — CEFAZOLIN SODIUM-DEXTROSE 1-4 GM/50ML-% IV SOLN
1.0000 g | Freq: Once | INTRAVENOUS | Status: AC
  Administered 2018-07-04: 1 g via INTRAVENOUS
  Filled 2018-07-04: qty 50

## 2018-07-04 NOTE — Progress Notes (Signed)
Tommi Rumps, MD Phone: [phone]  Henry Mays is a 77 y.o. male who presents today for f/u.  CC: HTN, mouth sores, elevated PSA, osteoporosis  HYPERTENSION  Disease Monitoring  Home BP Monitoring not checking Chest pain- no    Dyspnea- no Medications  Compliance-  Not on medication.  Lightheadedness-no      edema- no  Mouth sores: Patient takes dexamethasone a little less than a teaspoon daily and has not had any sores in a long time.  He was started on this by Dr. in [facility].  He notes when he lived in [place] he spent 3 years at [place] being evaluated for this with no cause found.  He has a known colloid cyst in his brain.  This was stable on recent MRI.  He notes he never heard from neurology for F/u.  Osteoporosis: He has been taking Prolia through our office.  No calcium or vitamin D supplementation.  Due for repeat DEXA scan.  Elevated PSA: Followed by urology.  Most recent PSA 4.6.  They recommended 2-year follow-up.  He will be due in March 2020.    Social History   Tobacco Use  Smoking Status Former Smoker  Smokeless Tobacco Never Used     ROS see history of present illness  Objective  Physical Exam Vitals:   07/04/18 0849  BP: 102/70  Pulse: 67  Temp: 97.7 F (36.5 C)  SpO2: 98%    BP Readings from Last 3 Encounters:  07/04/18 102/70  04/08/18 108/62  04/04/18 138/73   Wt Readings from Last 3 Encounters:  07/04/18 149 lb (67.6 kg)  04/04/18 143 lb (64.9 kg)  04/01/18 144 lb 3.2 oz (65.4 kg)    Physical Exam  Constitutional: No distress.  HENT:  No mouth sores noted, 2 small patches on his tongue with lack of taste buds  Cardiovascular: Normal rate, regular rhythm and normal heart sounds.  Pulmonary/Chest: Effort normal and breath sounds normal.  Musculoskeletal: He exhibits no edema.  Neurological: He is alert.  Skin: Skin is warm and dry. He is not diaphoretic.     Assessment/Plan: Please see individual problem  list.  Essential hypertension Blood pressure has been well controlled off of medication.  We will resolve this issue.  Colloid cyst of brain ([place]) Stable on recent MRI.  We will have our referral coordinator check on referral to neurology.  Osteoporosis Continue Prolia.  Check vitamin D.  Continue DEXA scan ordered.  Elevated PSA Mildly elevated.  Based on most recent note they were plan and follow-up in 2 years  Mouth sores Asymptomatic with dexamethasone.  Reports extensive evaluation in the past.  He will continue dexamethasone.  He believes his tongue has appeared similar for some time now.  He reports he sees his dentist for cleaning every 4 months.  He is also getting implants.  I have asked him to have his dentist look at the patches on his tongue to consider additional evaluation if needed.   Orders Placed This Encounter  Procedures  . DG Bone Density    Standing Status:   Future    Standing Expiration Date:   09/05/2019    Order Specific Question:   Reason for Exam (SYMPTOM  OR DIAGNOSIS REQUIRED)    Answer:   osteoporosis    Order Specific Question:   Preferred imaging location?    Answer:   [facility]  . Vitamin D (25 hydroxy)  . Lipid panel    No orders of  the defined types were placed in this encounter.    Tommi Rumps, MD [place]

## 2018-07-04 NOTE — ED Notes (Signed)
Wounds on head cleaned and dry sterile dressing placed. Pt has large skin tear on his right hand, wrist and forearm. Nerve block placed by Dr. Mable Paris. Pt is comfortable at this time. No LOC with fall. Has laceration over his right eye and middle right side of his head. Bleeding is controlled with direct pressure from dressing.

## 2018-07-04 NOTE — Assessment & Plan Note (Signed)
Asymptomatic with dexamethasone.  Reports extensive evaluation in the past.  He will continue dexamethasone.  He believes his tongue has appeared similar for some time now.  He reports he sees his dentist for cleaning every 4 months.  He is also getting implants.  I have asked him to have his dentist look at the patches on his tongue to consider additional evaluation if needed.

## 2018-07-04 NOTE — ED Notes (Signed)
Verbal report given to Rudell Cobb, Agricultural consultant at [facility].

## 2018-07-04 NOTE — Assessment & Plan Note (Signed)
Blood pressure has been well controlled off of medication.  We will resolve this issue.

## 2018-07-04 NOTE — Assessment & Plan Note (Signed)
Continue Prolia.  Check vitamin D.  Continue DEXA scan ordered.

## 2018-07-04 NOTE — ED Provider Notes (Signed)
[facility] Emergency Department Provider Note  ____________________________________________   First MD Initiated Contact with Patient 07/04/18 1959     (approximate)  I have reviewed the triage vital signs and the nursing notes.   HISTORY  Chief Complaint Fall and Laceration   HPI Henry Mays is a 77 y.o. male who comes to the emergency department via EMS after tripping and falling while walking his dog this evening.  He hit his head, injured his right arm, and his right knee.  He takes no blood thinning medication.  He did not lose consciousness.  He has no neck pain.  No double vision or blurred vision.  He said his dog got "spooked" and began to run and pulled the leash and he fell to the ground.  He sustained sudden onset severe pain in his head which is resolving with time.  His last tetanus is unknown.      Past Medical History:  Diagnosis Date  . Arthritis   . Basal cell carcinoma   . Chickenpox   . History of Clostridium difficile infection   . Hypertension   . Near syncope     Patient Active Problem List   Diagnosis Date Noted  . Elevated PSA 07/04/2018  . Mouth sores 07/04/2018  . Dental anomaly 04/01/2018  . Plantar wart of right foot 05/14/2017  . Fall 10/24/2016  . PVC (premature ventricular contraction) 08/28/2016  . Intractable hiccups 08/07/2016  . Colloid cyst of brain ([place]) 05/22/2016  . Osteoporosis 05/22/2016  . Decreased right shoulder range of motion 04/10/2016  . MGUS (monoclonal gammopathy of unknown significance) 04/10/2016    Past Surgical History:  Procedure Laterality Date  . BASAL CELL CARCINOMA EXCISION    . HERNIA REPAIR      Prior to Admission medications   Medication Sig Start Date End Date Taking? Authorizing Provider  dexamethasone 0.5 MG/5ML elixir TAKE TWO TEASPOONFULS (10ML) BY MOUTH THREE TIMES A DAY 07/03/18   Leone Haven, MD    Allergies Patient has no known allergies.  Family  History  Problem Relation Age of Onset  . Arthritis Unknown   . Stroke Unknown   . Hypertension Unknown   . Leukemia Father   . CAD Mother   . Prostate cancer Neg Hx   . Bladder Cancer Neg Hx   . Kidney cancer Neg Hx     Social History Social History   Tobacco Use  . Smoking status: Former Research scientist (life sciences)  . Smokeless tobacco: Never Used  Substance Use Topics  . Alcohol use: Yes    Alcohol/week: 6.0 oz    Types: 10 Standard drinks or equivalent per week    Comment: Not drinking any alcohol since c diff diagnosis  . Drug use: No    Review of Systems Constitutional: No fever/chills Eyes: No visual changes. ENT: No sore throat. Cardiovascular: Denies chest pain. Respiratory: Denies shortness of breath. Gastrointestinal: No abdominal pain.  No nausea, no vomiting.  No diarrhea.  No constipation. Genitourinary: Negative for dysuria. Musculoskeletal: Negative for back pain. Skin: Positive for wound Neurological: Positive for headache ____________________________________________   PHYSICAL EXAM:  VITAL SIGNS: ED Triage Vitals  Enc Vitals Group     BP      Pulse      Resp      Temp      Temp src      SpO2      Weight      Height  Head Circumference      Peak Flow      Pain Score      Pain Loc      Pain Edu?      Excl. in [place]?     Constitutional: Alert and oriented x4 appears uncomfortable and upset Eyes: PERRL EOMI. midrange and brisk.  No entrapment Head: Multiple abrasions. Nose: No congestion/rhinnorhea. Mouth/Throat: No trismus Neck: No stridor.  No midline tenderness or step-offs Cardiovascular: Normal rate, regular rhythm. Grossly normal heart sounds.  Good peripheral circulation. Respiratory: Normal respiratory effort.  No retractions. Lungs CTAB and moving good air Gastrointestinal: Soft nontender Musculoskeletal: Right upper extremity with roughly 25 cm area of degloving on the dorsal surface on the ulnar side with exposed extensor tendons Can fully  extend arm and can fully supinate and pronate neurovascularly intact Neurologic:  Normal speech and language. No gross focal neurologic deficits are appreciated. Skin: Degloving injury as above Multiple superficial abrasions to the knee on the right Psychiatric: Mood and affect are normal. Speech and behavior are normal.    ____________________________________________   DIFFERENTIAL includes but not limited to  Intracerebral hemorrhage, cervical spine fracture, arm fracture, knee fracture, open joint ____________________________________________   LABS (all labs ordered are listed, but only abnormal results are displayed)  Labs Reviewed - No data to display   __________________________________________  EKG   ____________________________________________  RADIOLOGY  CT scan of the head and neck reviewed by me with no acute disease X-ray of the right wrist concerning for nondisplaced fracture ____________________________________________   PROCEDURES  Procedure(s) performed: Yes  .Critical Care Performed by: Darel Hong, MD Authorized by: Darel Hong, MD   Critical care provider statement:    Critical care time (minutes):  45   Critical care time was exclusive of:  Separately billable procedures and treating other patients   Critical care was necessary to treat or prevent imminent or life-threatening deterioration of the following conditions:  Trauma   Critical care was time spent personally by me on the following activities:  Development of treatment plan with patient or surrogate, discussions with consultants, evaluation of patient's response to treatment, examination of patient, obtaining history from patient or surrogate, ordering and performing treatments and interventions, ordering and review of laboratory studies, ordering and review of radiographic studies, pulse oximetry, re-evaluation of patient's condition and review of old charts .Nerve Block Date/Time:  07/04/2018 8:30 PM Performed by: Darel Hong, MD Authorized by: Darel Hong, MD   Consent:    Consent obtained:  Verbal   Consent given by:  Patient   Risks discussed:  Infection, nerve damage, pain and unsuccessful block   Alternatives discussed:  Alternative treatment Indications:    Indications:  Pain relief and procedural anesthesia Location:    Body area:  Upper extremity   Upper extremity nerve:  Radial   Laterality:  Right Pre-procedure details:    Skin preparation:  2% chlorhexidine Skin anesthesia (see MAR for exact dosages):    Skin anesthesia method:  None Procedure details (see MAR for exact dosages):    Block needle gauge:  25 G   Guidance: ultrasound     Anesthetic injected:  Bupivacaine 0.5% w/o epi   Steroid injected:  None   Additive injected:  None   Injection procedure:  Anatomic landmarks identified, anatomic landmarks palpated, incremental injection and negative aspiration for blood   Paresthesia:  None Post-procedure details:    Dressing:  None   Outcome:  Pain relieved   Patient tolerance of procedure:  Tolerated well, no immediate complications Comments:     I performed a radial nerve block on the lateral aspect of his right arm midshaft humerus using a total of about 7 cc of 0.5 bupivacaine without epinephrine achieving good anesthesia    Critical Care performed: Yes  ____________________________________________   INITIAL IMPRESSION / ASSESSMENT AND PLAN / ED COURSE  Pertinent labs & imaging results that were available during my care of the patient were reviewed by me and considered in my medical decision making (see chart for details).   As part of my medical decision making, I reviewed the following data within the [place] History obtained from family if available, nursing notes, old chart and ekg, as well as notes from prior ED visits.  The patient arrives with a degloving injury to his right arm along with  abrasions to his forehead and knee.  Most concerning is he does report drinking alcohol today and he had a significant fall so we will CT scan his head and neck along with plain films.  IV fentanyl for pain control now.  Fortunately the patient's neuroimaging is negative.  I performed a radial nerve block midshaft humerus achieving good anesthesia as it will undoubtably be extremely painful to washout his wound.  X-ray is concerning for nondisplaced fracture so I will reach out to orthopedic surgery to discuss.  Ancef and gentamicin IV now.  Dr. Donivan Scull to bedside and personally evaluated the patient.  He recommends transfer to [facility] for evaluation by plastic surgery and hand as the patient will likely require skin grafting.  The patient verbalizes understanding and agreement.  I spoke with [facility] transfer center and the patient has been accepted by the trauma service as an ER to ER transfer.     ____________________________________________   FINAL CLINICAL IMPRESSION(S) / ED DIAGNOSES  Final diagnoses:  Open fracture  Skin tear of right elbow without complication, initial encounter  Closed fracture of right orbital floor, initial encounter [facility])      NEW MEDICATIONS STARTED DURING THIS VISIT:  Discharge Medication List as of 07/04/2018 11:30 PM       Note:  This document was prepared using Dragon voice recognition software and may include unintentional dictation errors.     Darel Hong, MD 07/07/18 [phone]

## 2018-07-04 NOTE — ED Triage Notes (Signed)
Pt states was walking his dog, when he fell. Pt with skin tear entire length of right forearm, laceration to right head. No blood thinners. Pt denies loc.

## 2018-07-04 NOTE — Assessment & Plan Note (Signed)
Stable on recent MRI.  We will have our referral coordinator check on referral to neurology.

## 2018-07-04 NOTE — Patient Instructions (Signed)
Nice to see you. We will get you set up for a bone density test. You will need to follow-up with your urologist in March 2020 You can continue the dexamethasone for your mouth sores.

## 2018-07-04 NOTE — Assessment & Plan Note (Signed)
Mildly elevated.  Based on most recent note they were plan and follow-up in 2 years

## 2018-07-05 DIAGNOSIS — S6991XA Unspecified injury of right wrist, hand and finger(s), initial encounter: Secondary | ICD-10-CM | POA: Diagnosis not present

## 2018-07-05 DIAGNOSIS — S299XXA Unspecified injury of thorax, initial encounter: Secondary | ICD-10-CM | POA: Diagnosis not present

## 2018-07-05 DIAGNOSIS — S0231XA Fracture of orbital floor, right side, initial encounter for closed fracture: Secondary | ICD-10-CM | POA: Diagnosis not present

## 2018-07-05 DIAGNOSIS — M810 Age-related osteoporosis without current pathological fracture: Secondary | ICD-10-CM | POA: Diagnosis not present

## 2018-07-05 DIAGNOSIS — S51811A Laceration without foreign body of right forearm, initial encounter: Secondary | ICD-10-CM | POA: Diagnosis not present

## 2018-07-05 DIAGNOSIS — S01111A Laceration without foreign body of right eyelid and periocular area, initial encounter: Secondary | ICD-10-CM | POA: Diagnosis not present

## 2018-07-05 DIAGNOSIS — Z87891 Personal history of nicotine dependence: Secondary | ICD-10-CM | POA: Diagnosis not present

## 2018-07-05 DIAGNOSIS — S80811A Abrasion, right lower leg, initial encounter: Secondary | ICD-10-CM | POA: Diagnosis not present

## 2018-07-05 DIAGNOSIS — M1990 Unspecified osteoarthritis, unspecified site: Secondary | ICD-10-CM | POA: Diagnosis not present

## 2018-07-05 DIAGNOSIS — W19XXXA Unspecified fall, initial encounter: Secondary | ICD-10-CM | POA: Diagnosis not present

## 2018-07-05 DIAGNOSIS — D472 Monoclonal gammopathy: Secondary | ICD-10-CM | POA: Diagnosis not present

## 2018-07-05 DIAGNOSIS — Z79899 Other long term (current) drug therapy: Secondary | ICD-10-CM | POA: Diagnosis not present

## 2018-07-05 DIAGNOSIS — I10 Essential (primary) hypertension: Secondary | ICD-10-CM | POA: Diagnosis not present

## 2018-07-05 DIAGNOSIS — S0101XA Laceration without foreign body of scalp, initial encounter: Secondary | ICD-10-CM | POA: Diagnosis not present

## 2018-07-05 DIAGNOSIS — S0181XA Laceration without foreign body of other part of head, initial encounter: Secondary | ICD-10-CM | POA: Diagnosis not present

## 2018-07-05 DIAGNOSIS — M85841 Other specified disorders of bone density and structure, right hand: Secondary | ICD-10-CM | POA: Diagnosis not present

## 2018-07-05 DIAGNOSIS — S0240CA Maxillary fracture, right side, initial encounter for closed fracture: Secondary | ICD-10-CM | POA: Diagnosis not present

## 2018-07-05 DIAGNOSIS — S80211A Abrasion, right knee, initial encounter: Secondary | ICD-10-CM | POA: Diagnosis not present

## 2018-07-05 DIAGNOSIS — S3991XA Unspecified injury of abdomen, initial encounter: Secondary | ICD-10-CM | POA: Diagnosis not present

## 2018-07-05 DIAGNOSIS — G8911 Acute pain due to trauma: Secondary | ICD-10-CM | POA: Diagnosis not present

## 2018-07-05 MED ORDER — EQUATE NICOTINE 4 MG MT GUM
4.00 | CHEWING_GUM | OROMUCOSAL | Status: DC
Start: ? — End: 2018-07-05

## 2018-07-05 MED ORDER — SENNOSIDES-DOCUSATE SODIUM 8.6-50 MG PO TABS
2.00 | ORAL_TABLET | ORAL | Status: DC
Start: 2018-07-08 — End: 2018-07-05

## 2018-07-05 MED ORDER — OXYCODONE HCL 5 MG PO TABS
5.00 | ORAL_TABLET | ORAL | Status: DC
Start: ? — End: 2018-07-05

## 2018-07-05 MED ORDER — SODIUM CHLORIDE 0.9 % IJ SOLN
5.00 | INTRAMUSCULAR | Status: DC
Start: 2018-07-08 — End: 2018-07-05

## 2018-07-05 MED ORDER — ENUCLENE 0.25 % OP SOLN
OPHTHALMIC | Status: DC
Start: 2018-07-07 — End: 2018-07-05

## 2018-07-05 MED ORDER — ACETAMINOPHEN 325 MG PO TABS
650.00 | ORAL_TABLET | ORAL | Status: DC
Start: 2018-07-08 — End: 2018-07-05

## 2018-07-06 DIAGNOSIS — S51809A Unspecified open wound of unspecified forearm, initial encounter: Secondary | ICD-10-CM | POA: Diagnosis not present

## 2018-07-06 DIAGNOSIS — S0231XA Fracture of orbital floor, right side, initial encounter for closed fracture: Secondary | ICD-10-CM | POA: Diagnosis not present

## 2018-07-06 DIAGNOSIS — S0219XA Other fracture of base of skull, initial encounter for closed fracture: Secondary | ICD-10-CM | POA: Diagnosis not present

## 2018-07-06 DIAGNOSIS — W19XXXA Unspecified fall, initial encounter: Secondary | ICD-10-CM | POA: Diagnosis not present

## 2018-07-07 ENCOUNTER — Telehealth: Payer: Self-pay | Admitting: Family Medicine

## 2018-07-07 DIAGNOSIS — S51809A Unspecified open wound of unspecified forearm, initial encounter: Secondary | ICD-10-CM | POA: Diagnosis not present

## 2018-07-07 DIAGNOSIS — S0231XA Fracture of orbital floor, right side, initial encounter for closed fracture: Secondary | ICD-10-CM | POA: Diagnosis not present

## 2018-07-07 DIAGNOSIS — S0219XA Other fracture of base of skull, initial encounter for closed fracture: Secondary | ICD-10-CM | POA: Diagnosis not present

## 2018-07-07 DIAGNOSIS — W19XXXA Unspecified fall, initial encounter: Secondary | ICD-10-CM | POA: Diagnosis not present

## 2018-07-07 NOTE — Telephone Encounter (Signed)
Insurance verification for Prolia filed on [employer] Portal. 

## 2018-07-10 ENCOUNTER — Telehealth: Payer: Self-pay

## 2018-07-10 NOTE — Telephone Encounter (Signed)
Copied from [place] [id number]. Topic: General - Other >> Jul 10, 2018  1:41 PM Carolyn Stare wrote:  Pt call about a Prolia shot and would like a call back   [phone]

## 2018-07-10 NOTE — Telephone Encounter (Signed)
Please advise, looks like you have started prolia process for patient

## 2018-07-11 DIAGNOSIS — S0230XD Fracture of orbital floor, unspecified side, subsequent encounter for fracture with routine healing: Secondary | ICD-10-CM | POA: Diagnosis not present

## 2018-07-11 DIAGNOSIS — S02401D Maxillary fracture, unspecified, subsequent encounter for fracture with routine healing: Secondary | ICD-10-CM | POA: Diagnosis not present

## 2018-07-11 NOTE — Telephone Encounter (Signed)
Called patient left message needs nurse visit for Prolia injection may schedule or or after 07/17/18, [facility] may schedule Nurse visit.

## 2018-07-11 NOTE — Telephone Encounter (Signed)
See phone note dated 07/10/18 Prolia approved patient needs nurse visit for injection [facility] may schedule.

## 2018-07-15 DIAGNOSIS — T8130XA Disruption of wound, unspecified, initial encounter: Secondary | ICD-10-CM | POA: Diagnosis not present

## 2018-07-15 DIAGNOSIS — S41101A Unspecified open wound of right upper arm, initial encounter: Secondary | ICD-10-CM | POA: Diagnosis not present

## 2018-07-15 NOTE — Telephone Encounter (Signed)
Patient scheduled with PCP on 07/23/18 prolia

## 2018-07-23 ENCOUNTER — Encounter: Payer: Self-pay | Admitting: Family Medicine

## 2018-07-23 ENCOUNTER — Ambulatory Visit (INDEPENDENT_AMBULATORY_CARE_PROVIDER_SITE_OTHER): Payer: Medicare Other | Admitting: Family Medicine

## 2018-07-23 ENCOUNTER — Telehealth: Payer: Self-pay | Admitting: Family Medicine

## 2018-07-23 DIAGNOSIS — W19XXXA Unspecified fall, initial encounter: Secondary | ICD-10-CM | POA: Diagnosis not present

## 2018-07-23 DIAGNOSIS — M8080XA Other osteoporosis with current pathological fracture, unspecified site, initial encounter for fracture: Secondary | ICD-10-CM | POA: Diagnosis not present

## 2018-07-23 DIAGNOSIS — S41101A Unspecified open wound of right upper arm, initial encounter: Secondary | ICD-10-CM

## 2018-07-23 DIAGNOSIS — S0280XA Fracture of other specified skull and facial bones, unspecified side, initial encounter for closed fracture: Secondary | ICD-10-CM

## 2018-07-23 DIAGNOSIS — S0285XA Fracture of orbit, unspecified, initial encounter for closed fracture: Secondary | ICD-10-CM | POA: Insufficient documentation

## 2018-07-23 NOTE — Telephone Encounter (Signed)
Pt left stated hes good and did not scheduled fu.

## 2018-07-23 NOTE — Telephone Encounter (Signed)
Noted  

## 2018-07-23 NOTE — Assessment & Plan Note (Signed)
Patient reports this is well-healing.  The wound was not observed today given that it was wrapped and he will have this dressing changed by his nurse at his living facility later today.  He will see his orthopedist tomorrow.  He is given return precautions regarding infection.  He will continue to follow with orthopedics.

## 2018-07-23 NOTE — Assessment & Plan Note (Signed)
Patient is asymptomatic from this.  He will continue to follow with the ENT for this and his other facial fractures.  He will check to see when his next appointment is and if it is not scheduled he will contact ENT.

## 2018-07-23 NOTE — Assessment & Plan Note (Signed)
Patient was supposed to get Prolia today.  We opted to hold off on this given that he is having a dental procedure and given his recent injuries.  I will check with our clinical pharmacist to determine if these are adequate reasons to hold off on giving a dose of Prolia.

## 2018-07-23 NOTE — Telephone Encounter (Signed)
fyi

## 2018-07-23 NOTE — Progress Notes (Signed)
Tommi Rumps, MD Phone: [phone]  Henry Mays is a 77 y.o. male who presents today for f/u./  CC: hospital follow-up for fall  Patient was hospitalized at [facility] from 07/05/2018-07/07/2018 following a fall where he was bent over picking up a bowel movement from his dog when his dog took off and he fell over.  He did hit his face.  He had a degloving injury of the right forearm.  He suffered several facial fractures with comminuted orbital rim fracture, anterior and posterior maxillary wall fracture, and orbital floor fracture on the right.  He has followed up with ENT recently.  There is no planned surgical intervention.  He also followed up with orthopedics for his skin injury.  He reports that is healing well.  It is wrapped at this time and he is seeing a nurse this afternoon to have this evaluated.  He follows up with orthopedics tomorrow as well.  He notes no fevers.  No sinus drainage.  No vision changes or double vision.  He has had no pain anywhere.  There is no discharge summary available to review.  Medications reviewed.  He reports he is going to have a dental procedure coming up.  He is supposed to get Prolia.  Social History   Tobacco Use  Smoking Status Former Smoker  Smokeless Tobacco Never Used     ROS see history of present illness  Objective  Physical Exam Vitals:   07/23/18 1129  BP: 112/76  Pulse: 67  Temp: 98.4 F (36.9 C)  SpO2: 97%    BP Readings from Last 3 Encounters:  07/23/18 112/76  07/04/18 121/72  07/04/18 102/70   Wt Readings from Last 3 Encounters:  07/23/18 139 lb 3.2 oz (63.1 kg)  07/04/18 149 lb (67.6 kg)  07/04/18 149 lb (67.6 kg)    Physical Exam  Constitutional: No distress.  HENT:  Head: Normocephalic.  Scab near her right temple, no tenderness of either bony orbit, no bony defects noted of either orbit  Eyes: Pupils are equal, round, and reactive to light. Conjunctivae and EOM are normal.  Cardiovascular:  Normal rate, regular rhythm and normal heart sounds.  Pulmonary/Chest: Effort normal and breath sounds normal.  Musculoskeletal: He exhibits no edema.  Right forearm is dressed with an Ace wrap, he is able to wiggle his fingers in his right hand, they are warm with good capillary refill, no signs of erythema extending proximally or distally to the wrap  Neurological: He is alert.  Skin: Skin is warm and dry. He is not diaphoretic.     Assessment/Plan: Please see individual problem list.  Fall Patient with fall likely contributed to by balance issues and the fact that he was bending over and holding his dog's leash at the same time.  Discussed possibly doing physical therapy again though he deferred.  He wants to start walking for exercise.  Discussed not bending over while holding his dog's leash.  Orbital fracture [facility]) Patient is asymptomatic from this.  He will continue to follow with the ENT for this and his other facial fractures.  He will check to see when his next appointment is and if it is not scheduled he will contact ENT.  Avulsion of skin of upper extremity, right, initial encounter Patient reports this is well-healing.  The wound was not observed today given that it was wrapped and he will have this dressing changed by his nurse at his living facility later today.  He will see his orthopedist  tomorrow.  He is given return precautions regarding infection.  He will continue to follow with orthopedics.  Osteoporosis Patient was supposed to get Prolia today.  We opted to hold off on this given that he is having a dental procedure and given his recent injuries.  I will check with our clinical pharmacist to determine if these are adequate reasons to hold off on giving a dose of Prolia.   No orders of the defined types were placed in this encounter.   No orders of the defined types were placed in this encounter.    Tommi Rumps, MD [place]

## 2018-07-23 NOTE — Patient Instructions (Signed)
Nice to see you. Please keep your appointments with your specialists.  Please contact the ENT office to ensure that she have follow-up with them. If you develop vision changes, double vision, fevers, signs of infection in your arm, or signs of infection at any of the other cuts please be evaluated.

## 2018-07-23 NOTE — Assessment & Plan Note (Signed)
Patient with fall likely contributed to by balance issues and the fact that he was bending over and holding his dog's leash at the same time.  Discussed possibly doing physical therapy again though he deferred.  He wants to start walking for exercise.  Discussed not bending over while holding his dog's leash.

## 2018-07-24 DIAGNOSIS — T8130XA Disruption of wound, unspecified, initial encounter: Secondary | ICD-10-CM | POA: Diagnosis not present

## 2018-07-24 DIAGNOSIS — S41101D Unspecified open wound of right upper arm, subsequent encounter: Secondary | ICD-10-CM | POA: Diagnosis not present

## 2018-07-25 ENCOUNTER — Telehealth: Payer: Self-pay | Admitting: Family Medicine

## 2018-07-25 NOTE — Telephone Encounter (Signed)
Please let the patient know that we need to hold off on his prolia at this time given that he is going to have a dental procedure to help reduce the risk of any adverse issues following his procedure. We can resume his prolia after he is well healed. Thanks.

## 2018-07-25 NOTE — Telephone Encounter (Signed)
-----   Message from De Hollingshead, [facility] sent at 07/25/2018  8:10 AM EDT ----- Dr. Caryl Bis,   You're correct, there is an incidence of ONJ with Prolia. I ran this by Dr. Valentina Lucks, as we don't have any solid guideline recommendations for holding Prolia before dental procedures. There is a paper from the American Association of Oral and Maxillofacial Surgeons that addresses holding bisphosphonates, but nothing about Prolia. Dr. Valentina Lucks and I agreed that we would recommend holding Prolia now, having the dental surgery, then waiting another month or when the patient is well-healed from the procedure, whichever is longer, before resuming.   Sorry we couldn't give you a more evidence-based answer! I wonder if the dental surgeon has any typically recommendations for holding Prolia.   See you Monday!  Catie   ----- Message ----- From: Leone Haven, MD Sent: 07/23/2018  12:46 PM EDT To: De Hollingshead, [facility]  Hey Catie,   This patient has been on prolia for his osteoporosis. He recently fell and suffered several fractures in his face and an avulsion skin injury to his right arm. He is also having dental surgery in the near future with likely implants. I believe the dental surgery may be a risk factor for osteonecrosis of the jaw with this medication. Would that be a reason not to continue this or delay giving this to him? Would his other injuries preclude him from getting the prolia? Hopefully you are the right person to start with for these questions. Thanks for your help.  Randall Hiss

## 2018-07-25 NOTE — Telephone Encounter (Signed)
Patient notified

## 2018-08-12 ENCOUNTER — Encounter: Payer: Self-pay | Admitting: Family Medicine

## 2018-08-14 DIAGNOSIS — T8130XA Disruption of wound, unspecified, initial encounter: Secondary | ICD-10-CM | POA: Diagnosis not present

## 2018-08-14 DIAGNOSIS — S41101D Unspecified open wound of right upper arm, subsequent encounter: Secondary | ICD-10-CM | POA: Diagnosis not present

## 2018-08-19 DIAGNOSIS — Q046 Congenital cerebral cysts: Secondary | ICD-10-CM | POA: Diagnosis not present

## 2018-08-20 DIAGNOSIS — H2513 Age-related nuclear cataract, bilateral: Secondary | ICD-10-CM | POA: Diagnosis not present

## 2018-08-20 DIAGNOSIS — H40003 Preglaucoma, unspecified, bilateral: Secondary | ICD-10-CM | POA: Diagnosis not present

## 2018-08-27 ENCOUNTER — Telehealth: Payer: Self-pay | Admitting: Family Medicine

## 2018-08-27 NOTE — Telephone Encounter (Signed)
Copied from [place] [id number]. Topic: Quick Communication - Rx Refill/Question >> Aug 27, 2018  4:37 PM Sheran Luz wrote: Medication: tetracaine 2% oral solution  Pt stated he was prescribed this medication by Dr. Caryl Bis and is requesting a refill. Please advise.    Has the patient contacted their pharmacy? Yes. Pt was advised to call office.   Preferred Pharmacy (with phone number or street name):  [facility] Address: [address], [place], [place] [address] Phone: [phone]

## 2018-08-28 NOTE — Telephone Encounter (Signed)
Called and spoke with pt. 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   Sent to PCP to advise if [facility] will filling Rx without an appt.   Call pt back once Rx has been sent.   Thanks

## 2018-08-28 NOTE — Telephone Encounter (Signed)
I have filled dexamethasone for him previously, though not this medication. It does not appear that there is an oral solution of this medication in our system. Please see who he has been getting this from. We may need to see him in the office or discuss this medication with our pharmacist. Thanks.

## 2018-08-28 NOTE — Telephone Encounter (Signed)
Patient was last seen on 07-23-18 and does not have a scheduled appt. This medication is not on current med list.

## 2018-08-28 NOTE — Telephone Encounter (Signed)
Called pt and left a detailed VM asked pt to call us back to let us know who was filling this medication prior.

## 2018-08-29 NOTE — Telephone Encounter (Signed)
Please document please.   Thanks

## 2018-08-29 NOTE — Telephone Encounter (Signed)
Please contact the patient's pharmacy to see if they have record of this being prescribed previously.  Please see if his current local pharmacy has this medication.  Thanks.

## 2018-08-29 NOTE — Telephone Encounter (Signed)
Sent to PCP ?

## 2018-08-29 NOTE — Telephone Encounter (Signed)
Called patient he stated that this medication was given to him by Dr. Elnoria Howard in [place] but it was compounded liquid and states that he doesn't use it often and says that Dr. Caryl Bis has prescribed this before.

## 2018-09-01 NOTE — Telephone Encounter (Signed)
Pt states that he got this medication from [facility] in [place] - phone number there is [phone].  Fax number [phone].

## 2018-09-01 NOTE — Telephone Encounter (Signed)
Called pt and left a VM to call back fax with message did NOT go through. We need to find out what pharmacy has filled this for him prior so we can know exactly what is in the compounded solution so we can send in the Rx for the patient.

## 2018-09-01 NOTE — Telephone Encounter (Signed)
Called the [facility] three times and call would NOT go though. I did send a fax with a message for the pharmacy to fax [place] the compounded medication so we will know how to send the Rx for the pt.   Fax went to [facility] store fax number [phone]

## 2018-09-01 NOTE — Telephone Encounter (Signed)
Patient called back to give location of what pharmacy he received medication from

## 2018-09-02 NOTE — Telephone Encounter (Signed)
See Telephone encounter for 9.23.19; 2:44pm

## 2018-09-02 NOTE — Telephone Encounter (Signed)
Sent to [facility] and CRM created 

## 2018-09-02 NOTE — Telephone Encounter (Signed)
Called pt and left a detailed VM asking for the pt to call back today and OK to leave a detailed VM to whoever they speak with. We just need to know what pharmacy has filled this RX prior or in the past so we can know how to send the compound Rx.

## 2018-09-03 NOTE — Telephone Encounter (Signed)
Called and spoke with pt he stated that he would reach out to the [facility] and see if they can fax [place] what is needed.

## 2018-09-04 NOTE — Telephone Encounter (Signed)
[facility] called today asking if refill request has been received and to see if Dr. Caryl Bis will refill the medication

## 2018-09-04 NOTE — Telephone Encounter (Signed)
We received a fax from [place] : [facility], [employer]  Medication   Tetracaine 2% oral soln swish and gargle with 1 teaspoonful three times daily   Qty 120   Sent to PCP   Rx needs to go to [facility]

## 2018-09-04 NOTE — Telephone Encounter (Signed)
Sent to PCP ?

## 2018-09-09 ENCOUNTER — Ambulatory Visit
Admission: RE | Admit: 2018-09-09 | Discharge: 2018-09-09 | Disposition: A | Discharge status: Home / Self Care | Payer: Medicare Other | Source: Ambulatory Visit | Attending: Family Medicine | Admitting: Family Medicine

## 2018-09-09 DIAGNOSIS — M810 Age-related osteoporosis without current pathological fracture: Secondary | ICD-10-CM | POA: Diagnosis not present

## 2018-09-09 NOTE — Telephone Encounter (Signed)
Patient would like to know the status of this and request Wilburn Cornelia to call him back.

## 2018-09-09 NOTE — Telephone Encounter (Signed)
Patient calling about rx request

## 2018-09-09 NOTE — Telephone Encounter (Signed)
I apologize for the delay in responding. The patient does not need an office visit. I can not find this medication in the database to e-scribe. I am happy to refill this. Please call them and see if you can give a verbal order for the medication as follows:  Tetracaine 2% oral solution. Directions swish and gargle with 1 teaspoonful three times daily. Quantity 120 mL with one refill.   If you are unable to give verbal orders for this I can sign a prescription to fax in to them.

## 2018-09-09 NOTE — Telephone Encounter (Signed)
Unable to call Rx into pharmacy.   This will need to be faxed. Sent to PCP

## 2018-09-09 NOTE — Telephone Encounter (Signed)
Were you able to send this in Dr. Caryl Bis I do not see that anything has been sent. Does pt need an OV?

## 2018-09-10 ENCOUNTER — Telehealth: Payer: Self-pay

## 2018-09-10 NOTE — Telephone Encounter (Signed)
Pt called to check status of Rx, pt was notified of notes below, contact pt if needed

## 2018-09-10 NOTE — Telephone Encounter (Signed)
Fax did not go through. I ended up calling the pharmacy with my personal phone since I could never get the call to go through with my work phone. Was able to speak with pharmacist and she stated that they have been having issues with calls getting blocked through their phone system and she was going too look into this issue gave her our office phone number and advised her of the issues I was having with faxing them as well.   We did go over there fax and phone number which is exactly what we had  [phone] [phone]  Was able to call in Rx for pt. Pt advised that this has been addressed and Rx was called into the pharmacy.

## 2018-09-10 NOTE — Telephone Encounter (Signed)
Called and spoke with pt. Pt advised and voiced understanding. Rx has been faxed this morning will refax again if needed. Pt stated that the pharmacy stated that they would call him once they get it.

## 2018-09-10 NOTE — Telephone Encounter (Signed)
Prescription signed and placed on Shelby's desk.

## 2018-09-10 NOTE — Telephone Encounter (Signed)
Patient checking on status of rx refill

## 2018-09-15 NOTE — Telephone Encounter (Signed)
Error

## 2018-12-29 ENCOUNTER — Telehealth: Payer: Self-pay | Admitting: Family Medicine

## 2018-12-29 NOTE — Telephone Encounter (Signed)
Patient was scheduled for Prolia 9/19 and was advised by PCP to hold injection, please advise to re certify for [id number].

## 2018-12-29 NOTE — Telephone Encounter (Signed)
Please see if the patient has completed his prior dental surgery.  Please see if his mouth has healed.  If it has we could proceed with resuming his Prolia.

## 2018-12-30 NOTE — Telephone Encounter (Signed)
Left message for patient to return my call.

## 2018-12-31 NOTE — Telephone Encounter (Signed)
Please see if we can get him set back up with the Prolia injections for his osteoporosis.  Thanks.

## 2018-12-31 NOTE — Telephone Encounter (Signed)
Patient states he did not have the surgery and his mouth is fine and would like to proceed with Prolia. His wife had a stroke and so he canceled the dental procedure

## 2019-01-06 ENCOUNTER — Other Ambulatory Visit: Payer: Self-pay | Admitting: Family Medicine

## 2019-01-08 ENCOUNTER — Emergency Department
Admission: EM | Admit: 2019-01-08 | Discharge: 2019-01-08 | Disposition: A | Discharge status: Home / Self Care | Payer: Medicare Other | Attending: Emergency Medicine | Admitting: Emergency Medicine

## 2019-01-08 ENCOUNTER — Encounter: Payer: Self-pay | Admitting: Emergency Medicine

## 2019-01-08 ENCOUNTER — Emergency Department: Payer: Medicare Other

## 2019-01-08 DIAGNOSIS — Z85828 Personal history of other malignant neoplasm of skin: Secondary | ICD-10-CM | POA: Diagnosis not present

## 2019-01-08 DIAGNOSIS — Y9301 Activity, walking, marching and hiking: Secondary | ICD-10-CM | POA: Diagnosis not present

## 2019-01-08 DIAGNOSIS — S0990XA Unspecified injury of head, initial encounter: Secondary | ICD-10-CM | POA: Diagnosis not present

## 2019-01-08 DIAGNOSIS — S0101XA Laceration without foreign body of scalp, initial encounter: Secondary | ICD-10-CM

## 2019-01-08 DIAGNOSIS — Z87891 Personal history of nicotine dependence: Secondary | ICD-10-CM | POA: Diagnosis not present

## 2019-01-08 DIAGNOSIS — W01198A Fall on same level from slipping, tripping and stumbling with subsequent striking against other object, initial encounter: Secondary | ICD-10-CM | POA: Insufficient documentation

## 2019-01-08 DIAGNOSIS — I10 Essential (primary) hypertension: Secondary | ICD-10-CM | POA: Insufficient documentation

## 2019-01-08 DIAGNOSIS — Y929 Unspecified place or not applicable: Secondary | ICD-10-CM | POA: Insufficient documentation

## 2019-01-08 DIAGNOSIS — Y998 Other external cause status: Secondary | ICD-10-CM | POA: Insufficient documentation

## 2019-01-08 NOTE — ED Triage Notes (Signed)
Pt stating he tripped and fell backwards hitting his head on the tile floor Tuesday evening. Pt has a wound to the posterior portion of his head. Pt's spouse died this morning and pt and pt's family has been attending to her until today. The onsite nurse had cleaned and dressed wound. On evaluation yesterday it was suggested pt get xrays of his head. Pt is not on blood thinners. Pt had no LOC

## 2019-01-08 NOTE — Discharge Instructions (Addendum)
Advised daily dressing change until scalp laceration is healed.

## 2019-01-08 NOTE — Telephone Encounter (Signed)
Last OV 07/23/2018   Sent to PCP for approval

## 2019-01-08 NOTE — ED Provider Notes (Signed)
[facility] Emergency Department Provider Note   ____________________________________________   First MD Initiated Contact with Patient 01/08/19 1404     (approximate)  I have reviewed the triage vital signs and the nursing notes.   HISTORY  Chief Complaint Head Injury    HPI Artavius Stearns is a 78 y.o. male patient tripped and fell backwards hitting his head on the tiled floor 2 nights ago.  Patient wound to the posterior aspect of his head.  Patient denies LOC, vision disturbance, or vertigo status post incident.  Wound was cleaned and dressed by onsite nurse.  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  Patient denies pain.   Past Medical History:  Diagnosis Date  . Arthritis   . Basal cell carcinoma   . Chickenpox   . History of Clostridium difficile infection   . Hypertension   . Near syncope     Patient Active Problem List   Diagnosis Date Noted  . Orbital fracture 07/23/2018  . Avulsion of skin of upper extremity, right, initial encounter 07/23/2018  . Elevated PSA 07/04/2018  . Mouth sores 07/04/2018  . Dental anomaly 04/01/2018  . Plantar wart of right foot 05/14/2017  . Fall 10/24/2016  . PVC (premature ventricular contraction) 08/28/2016  . Intractable hiccups 08/07/2016  . 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 Osteoporosis 05/22/2016  . Decreased right shoulder range of motion 04/10/2016  . MGUS (monoclonal gammopathy of unknown significance) 04/10/2016    Past Surgical History:  Procedure Laterality Date  . BASAL CELL CARCINOMA EXCISION    . HERNIA REPAIR      Prior to Admission medications   Not on File    Allergies Patient has no known allergies.  Family History  Problem Relation Age of Onset  . Arthritis Other   . Stroke Other   . Hypertension Other   . Leukemia Father   . CAD Mother   . Prostate cancer Neg Hx   . Bladder Cancer Neg Hx   . Kidney  cancer Neg Hx     Social History Social History   Tobacco Use  . Smoking status: Former Research scientist (life sciences)  . Smokeless tobacco: Never Used  Substance Use Topics  . Alcohol use: Yes    Alcohol/week: 10.0 standard drinks    Types: 10 Standard drinks or equivalent per week    Comment: Not drinking any alcohol since c diff diagnosis  . Drug use: No    Review of Systems Constitutional: No fever/chills Eyes: No visual changes. ENT: No sore throat. Cardiovascular: Denies chest pain. Respiratory: Denies shortness of breath. Gastrointestinal: No abdominal pain.  No nausea, no vomiting.  No diarrhea.  No constipation. Genitourinary: Negative for dysuria. Musculoskeletal: Negative for back pain. Skin: Negative for rash.  Scalp laceration and hematoma. Neurological: Negative for headaches, focal weakness or numbness. Endocrine:  Hypertension.   ____________________________________________   PHYSICAL EXAM:  VITAL SIGNS: ED Triage Vitals  Enc Vitals Group     BP 01/08/19 1300 (!) 147/61     Pulse Rate 01/08/19 1300 87     Resp 01/08/19 1300 20     Temp 01/08/19 1300 98.1 F (36.7 C)     Temp Source 01/08/19 1300 Oral     SpO2 01/08/19 1300 99 %     Weight 01/08/19 1304 140 lb (63.5 kg)     Height 01/08/19 1304 5\' 8"  (1.727 m)  Head Circumference --      Peak Flow --      Pain Score 01/08/19 1304 0     Pain Loc --      Pain Edu? --      Excl. in [place]? --    Constitutional: Alert and oriented. Well appearing and in no acute distress. Eyes: Conjunctivae are normal. PERRL. EOMI. Head: Hematoma and laceration to the scalp.  Neck: No cervical spine tenderness to palpation. Cardiovascular: Normal rate, regular rhythm. Grossly normal heart sounds.  Good peripheral circulation. Respiratory: Normal respiratory effort.  No retractions. Lungs CTAB. Musculoskeletal: No lower extremity tenderness nor edema.  No joint effusions. Neurologic:  Normal speech and language. No gross focal neurologic  deficits are appreciated. No gait instability. Skin: Reciprocal hematoma and laceration. Psychiatric: Mood and affect are normal. Speech and behavior are normal.  ____________________________________________   LABS (all labs ordered are listed, but only abnormal results are displayed)  Labs Reviewed - No data to display ____________________________________________  EKG   ____________________________________________  RADIOLOGY  ED MD interpretation:    Official radiology report(s): Dg Skull Complete  Result Date: 01/08/2019 CLINICAL DATA:  Acute fall and head injury 2 days ago. Initial encounter. EXAM: SKULL - COMPLETE 4 + VIEW COMPARISON:  Prior head CTs FINDINGS: No acute fracture. Small lucencies within the skull are unchanged from 2017 CT. No other significant abnormalities identified. IMPRESSION: No acute abnormality. Electronically Signed   By: Margarette Canada M.D.   On: 01/08/2019 14:55    ____________________________________________   PROCEDURES  Procedure(s) performed: None  Procedures  Critical Care performed: No  ____________________________________________   INITIAL IMPRESSION / ASSESSMENT AND PLAN / ED COURSE  As part of my medical decision making, I reviewed the following data within the [place]     Patient presents with evaluation of a hematoma scalp laceration 2 days ago secondary to fall.  Discussed neck x-ray findings.  Discussed rationale for not closing the wound.  Patient given discharge care instructions and advised follow-up PCP.      ____________________________________________   FINAL CLINICAL IMPRESSION(S) / ED DIAGNOSES  Final diagnoses:  Minor head injury, initial encounter  Laceration of scalp, initial encounter     ED Discharge Orders    None       Note:  This document was prepared using Dragon voice recognition software and may include unintentional dictation errors.    Sable Feil, PA-C 01/08/19  1509    Delman Kitten, MD 01/08/19 2129

## 2019-01-08 NOTE — ED Notes (Signed)
First nurse note:  Pt tripped over dog on Tuesday, fell onto tile floor and hit posterior head. Lives at [place] and was sent to [facility] for evaluation. [place] suggests that pt needs imaging of head.  Pt alert and oriented X 4, able to stand and ambulate to get into wheelchair.

## 2019-01-08 NOTE — ED Notes (Signed)
Pt states he was out walking dog and fell, abrasion to left arm and lac to back of head. Denies any pain or use of blood thinners.

## 2019-01-15 NOTE — Telephone Encounter (Signed)
submitted

## 2019-02-05 ENCOUNTER — Telehealth: Payer: Self-pay | Admitting: Family Medicine

## 2019-02-05 NOTE — Telephone Encounter (Signed)
Noted. Sorry to hear about his wife.

## 2019-02-05 NOTE — Telephone Encounter (Signed)
I received a request for physical therapy orders for this patient.  There is no reason for the physical therapy listed on the paperwork.  Please contact [facility] to find out what he needs this for.  Thanks.

## 2019-02-05 NOTE — Telephone Encounter (Signed)
Pt states that it was probably for his wife who passed away on 12-Jan-2023. He states to ignore it because he does not need any therapy.  Gae Bon, CMA

## 2019-02-16 ENCOUNTER — Telehealth: Payer: Self-pay

## 2019-02-16 NOTE — Telephone Encounter (Signed)
Refill request from [facility]   LAST OV 07/23/2018   Sent to PCP for approval fax placed in RED folder to review & sign

## 2019-02-17 NOTE — Telephone Encounter (Signed)
Completed.  Please fax.

## 2019-02-18 NOTE — Telephone Encounter (Signed)
Please give me this so I can fax this.   Thanks

## 2019-03-17 ENCOUNTER — Other Ambulatory Visit (HOSPITAL_COMMUNITY): Payer: Self-pay | Admitting: Acute Care

## 2019-03-17 DIAGNOSIS — Q046 Congenital cerebral cysts: Secondary | ICD-10-CM

## 2019-03-18 NOTE — Telephone Encounter (Signed)
Patient has been scheduled to restart Prolia.

## 2019-03-19 ENCOUNTER — Ambulatory Visit: Payer: Medicare Other

## 2019-03-25 ENCOUNTER — Ambulatory Visit (INDEPENDENT_AMBULATORY_CARE_PROVIDER_SITE_OTHER): Payer: Medicare Other

## 2019-03-25 ENCOUNTER — Other Ambulatory Visit: Payer: Self-pay

## 2019-03-25 DIAGNOSIS — M810 Age-related osteoporosis without current pathological fracture: Secondary | ICD-10-CM

## 2019-03-25 MED ORDER — DENOSUMAB 60 MG/ML ~~LOC~~ SOSY
60.0000 mg | PREFILLED_SYRINGE | Freq: Once | SUBCUTANEOUS | Status: AC
  Administered 2019-03-25: 60 mg via SUBCUTANEOUS

## 2019-03-25 NOTE — Progress Notes (Signed)
Pt presented today for prolia injection.  Administered SQ in left arm.  Patient tolerated well.

## 2019-03-26 ENCOUNTER — Inpatient Hospital Stay: Payer: Medicare Other

## 2019-04-03 ENCOUNTER — Ambulatory Visit: Payer: Medicare Other | Admitting: Oncology

## 2019-04-08 ENCOUNTER — Telehealth: Payer: Self-pay | Admitting: Family Medicine

## 2019-04-08 ENCOUNTER — Other Ambulatory Visit: Payer: Self-pay

## 2019-04-08 NOTE — Telephone Encounter (Signed)
Copied from [place] [id number]. Topic: Quick Communication - Rx Refill/Question >> Apr 08, 2019  9:48 AM Sheran Luz wrote: Medication: dexamethasone 0.5 MG/5ML elixir   Patient is requesting a refill of this medication.   Preferred Pharmacy (with phone number or street name):[facility], [place] [facility] [place] [phone] (Phone) [phone] (Fax)

## 2019-04-09 ENCOUNTER — Telehealth: Payer: Self-pay

## 2019-04-09 MED ORDER — DEXAMETHASONE 0.5 MG/5ML PO ELIX: ORAL_SOLUTION | ORAL | 0 refills | Status: DC

## 2019-04-09 NOTE — Telephone Encounter (Signed)
Rx has been signed by Philis Nettle and faxed.

## 2019-04-09 NOTE — Telephone Encounter (Signed)
Pt is saying that he needs tetracane liquid, not the one that was ordered.  Please send to [facility]  He is askig for couple of refills

## 2019-04-09 NOTE — Telephone Encounter (Signed)
Working on this now

## 2019-04-09 NOTE — Telephone Encounter (Signed)
Rx sent to [facility]  Please be sure he gets a follow up scheduled at some point -- none scheduled currently

## 2019-04-09 NOTE — Telephone Encounter (Signed)
Pharmacy called and stated that they still have not received fax. Please advise

## 2019-04-09 NOTE — Telephone Encounter (Signed)
Lauren I have the actual  fax from the pharmacy could you sign this and I will fax it?   Once done I will be happy to schedule pt for an appt.

## 2019-04-09 NOTE — Telephone Encounter (Signed)
Last OV 07/23/2018    Next OV none scheduled   Sent to covering PCP lauren could you sign this ?

## 2019-04-09 NOTE — Telephone Encounter (Signed)
Rx was faxed and pt advised that this has been done.

## 2019-04-12 NOTE — Telephone Encounter (Signed)
Refilled by An[facility] Purpura last week.

## 2019-04-20 ENCOUNTER — Ambulatory Visit
Admission: RE | Admit: 2019-04-20 | Discharge: 2019-04-20 | Disposition: A | Discharge status: Home / Self Care | Payer: Medicare Other | Source: Ambulatory Visit | Attending: Acute Care | Admitting: Acute Care

## 2019-04-20 ENCOUNTER — Other Ambulatory Visit: Payer: Self-pay

## 2019-04-20 DIAGNOSIS — G930 Cerebral cysts: Secondary | ICD-10-CM | POA: Diagnosis not present

## 2019-04-20 DIAGNOSIS — Q046 Congenital cerebral cysts: Secondary | ICD-10-CM | POA: Diagnosis not present

## 2019-04-20 LAB — POCT I-STAT CREATININE: Creatinine, Ser: 1 mg/dL (ref 0.61–1.24)

## 2019-04-20 MED ORDER — GADOBUTROL 1 MMOL/ML IV SOLN
6.0000 mL | Freq: Once | INTRAVENOUS | Status: AC | PRN
  Administered 2019-04-20: 6 mL via INTRAVENOUS

## 2019-06-01 ENCOUNTER — Telehealth: Payer: Self-pay | Admitting: Oncology

## 2019-06-01 ENCOUNTER — Other Ambulatory Visit: Payer: Self-pay

## 2019-06-01 NOTE — Telephone Encounter (Signed)
Spoke with pt to confirm appt date/time, do pre-appt screen which was completed, and adv of Covid-19 guidelines for appt regarding screening questions, temperature check, face mask required, and no visitors allowed °

## 2019-06-02 ENCOUNTER — Other Ambulatory Visit: Payer: Self-pay

## 2019-06-02 ENCOUNTER — Inpatient Hospital Stay: Payer: Medicare Other | Attending: Oncology

## 2019-06-02 DIAGNOSIS — D472 Monoclonal gammopathy: Secondary | ICD-10-CM | POA: Insufficient documentation

## 2019-06-02 DIAGNOSIS — Z87891 Personal history of nicotine dependence: Secondary | ICD-10-CM | POA: Diagnosis not present

## 2019-06-02 DIAGNOSIS — I10 Essential (primary) hypertension: Secondary | ICD-10-CM | POA: Diagnosis not present

## 2019-06-02 DIAGNOSIS — M129 Arthropathy, unspecified: Secondary | ICD-10-CM | POA: Diagnosis not present

## 2019-06-02 DIAGNOSIS — G930 Cerebral cysts: Secondary | ICD-10-CM | POA: Insufficient documentation

## 2019-06-02 DIAGNOSIS — Z8781 Personal history of (healed) traumatic fracture: Secondary | ICD-10-CM | POA: Diagnosis not present

## 2019-06-02 DIAGNOSIS — Z8619 Personal history of other infectious and parasitic diseases: Secondary | ICD-10-CM | POA: Diagnosis not present

## 2019-06-02 DIAGNOSIS — Z9181 History of falling: Secondary | ICD-10-CM | POA: Diagnosis not present

## 2019-06-02 DIAGNOSIS — Z85828 Personal history of other malignant neoplasm of skin: Secondary | ICD-10-CM | POA: Insufficient documentation

## 2019-06-02 LAB — COMPREHENSIVE METABOLIC PANEL
ALT: 18 U/L (ref 0–44)
AST: 20 U/L (ref 15–41)
Albumin: 3.5 g/dL (ref 3.5–5.0)
Alkaline Phosphatase: 69 U/L (ref 38–126)
Anion gap: 9 (ref 5–15)
BUN: 16 mg/dL (ref 8–23)
CO2: 27 mmol/L (ref 22–32)
Calcium: 8.6 mg/dL — ABNORMAL LOW (ref 8.9–10.3)
Chloride: 98 mmol/L (ref 98–111)
Creatinine, Ser: 0.94 mg/dL (ref 0.61–1.24)
GFR calc Af Amer: >60 mL/min (ref >60)
GFR calc non Af Amer: >60 mL/min (ref >60)
Glucose, Bld: 115 mg/dL — ABNORMAL HIGH (ref 70–99)
Potassium: 4 mmol/L (ref 3.5–5.1)
Sodium: 134 mmol/L — ABNORMAL LOW (ref 135–145)
Total Bilirubin: 1.1 mg/dL (ref 0.3–1.2)
Total Protein: 8.3 g/dL — ABNORMAL HIGH (ref 6.5–8.1)

## 2019-06-02 LAB — CBC WITH DIFFERENTIAL/PLATELET
Abs Immature Granulocytes: 0.06 10*3/uL (ref 0.00–0.07)
Basophils Absolute: 0 10*3/uL (ref 0.0–0.1)
Basophils Relative: 1 %
Eosinophils Absolute: 0.1 10*3/uL (ref 0.0–0.5)
Eosinophils Relative: 1 %
HCT: 40.5 % (ref 39.0–52.0)
Hemoglobin: 13.4 g/dL (ref 13.0–17.0)
Immature Granulocytes: 1 %
Lymphocytes Relative: 19 %
Lymphs Abs: 1.3 10*3/uL (ref 0.7–4.0)
MCH: 32 pg (ref 26.0–34.0)
MCHC: 33.1 g/dL (ref 30.0–36.0)
MCV: 96.7 fL (ref 80.0–100.0)
Monocytes Absolute: 1 10*3/uL (ref 0.1–1.0)
Monocytes Relative: 14 %
Neutro Abs: 4.4 10*3/uL (ref 1.7–7.7)
Neutrophils Relative %: 64 %
Platelets: 158 10*3/uL (ref 150–400)
RBC: 4.19 MIL/uL — ABNORMAL LOW (ref 4.22–5.81)
RDW: 14.1 % (ref 11.5–15.5)
WBC: 6.8 10*3/uL (ref 4.0–10.5)
nRBC: 0 % (ref 0.0–0.2)

## 2019-06-03 LAB — KAPPA/LAMBDA LIGHT CHAINS
Kappa free light chain: 78.9 mg/L — ABNORMAL HIGH (ref 3.3–19.4)
Kappa, lambda light chain ratio: 1.9 — ABNORMAL HIGH (ref 0.26–1.65)
Lambda free light chains: 41.5 mg/L — ABNORMAL HIGH (ref 5.7–26.3)

## 2019-06-03 LAB — MULTIPLE MYELOMA PANEL, SERUM
Albumin SerPl Elph-Mcnc: 3.5 g/dL (ref 2.9–4.4)
Albumin/Glob SerPl: 0.9 (ref 0.7–1.7)
Alpha 1: 0.2 g/dL (ref 0.0–0.4)
Alpha2 Glob SerPl Elph-Mcnc: 0.7 g/dL (ref 0.4–1.0)
B-Globulin SerPl Elph-Mcnc: 0.9 g/dL (ref 0.7–1.3)
Gamma Glob SerPl Elph-Mcnc: 2.1 g/dL — ABNORMAL HIGH (ref 0.4–1.8)
Globulin, Total: 4 g/dL — ABNORMAL HIGH (ref 2.2–3.9)
IgA: 765 mg/dL — ABNORMAL HIGH (ref 61–437)
IgG (Immunoglobin G), Serum: 2017 mg/dL — ABNORMAL HIGH (ref 603–1613)
IgM (Immunoglobulin M), Srm: 84 mg/dL (ref 15–143)
Total Protein ELP: 7.5 g/dL (ref 6.0–8.5)

## 2019-06-03 LAB — PROTEIN ELECTRO, RANDOM URINE
Albumin ELP, Urine: 0 %
Alpha-1-Globulin, U: 0 %
Alpha-2-Globulin, U: 0 %
Beta Globulin, U: 0 %
Gamma Globulin, U: 0 %
Total Protein, Urine: <4 mg/dL

## 2019-06-04 ENCOUNTER — Other Ambulatory Visit: Payer: Self-pay

## 2019-06-04 ENCOUNTER — Encounter: Payer: Self-pay | Admitting: Oncology

## 2019-06-04 ENCOUNTER — Inpatient Hospital Stay (HOSPITAL_BASED_OUTPATIENT_CLINIC_OR_DEPARTMENT_OTHER): Payer: Medicare Other | Admitting: Oncology

## 2019-06-04 VITALS — BP 168/74 | HR 76 | Temp 96.7°F | Resp 18 | Ht 68.0 in | Wt 141.7 lb

## 2019-06-04 DIAGNOSIS — Z87891 Personal history of nicotine dependence: Secondary | ICD-10-CM

## 2019-06-04 DIAGNOSIS — G930 Cerebral cysts: Secondary | ICD-10-CM

## 2019-06-04 DIAGNOSIS — I10 Essential (primary) hypertension: Secondary | ICD-10-CM

## 2019-06-04 DIAGNOSIS — Z8781 Personal history of (healed) traumatic fracture: Secondary | ICD-10-CM

## 2019-06-04 DIAGNOSIS — Z9181 History of falling: Secondary | ICD-10-CM

## 2019-06-04 DIAGNOSIS — R768 Other specified abnormal immunological findings in serum: Secondary | ICD-10-CM

## 2019-06-04 DIAGNOSIS — Z85828 Personal history of other malignant neoplasm of skin: Secondary | ICD-10-CM | POA: Diagnosis not present

## 2019-06-04 DIAGNOSIS — D472 Monoclonal gammopathy: Secondary | ICD-10-CM | POA: Diagnosis not present

## 2019-06-04 DIAGNOSIS — Z8619 Personal history of other infectious and parasitic diseases: Secondary | ICD-10-CM | POA: Diagnosis not present

## 2019-06-04 DIAGNOSIS — M129 Arthropathy, unspecified: Secondary | ICD-10-CM | POA: Diagnosis not present

## 2019-06-04 NOTE — Progress Notes (Signed)
Pt has no concerns

## 2019-06-05 NOTE — Progress Notes (Signed)
Hematology/Oncology Consult note [facility]  Telephone:([phone] Fax:([phone]) [phone]  Patient Care Team: Leone Haven, MD as PCP - General (Family Medicine)   Name of the patient: [facility]  [id number]  06-Feb-1941   Date of visit: 06/05/19  Diagnosis-history of MGUS  Chief complaint/ Reason for visit-routine follow-up of MGUS  Heme/Onc history: patient is a 78 year old male with a past medical history significant for Cholinoid cysts of the brain and MGUS among other medical problems.  He has chronic problems with balance issues.  He did sustain a fall about 2 years ago and fractured his right arm and has limited mobility in his right arm.  He was seen by a hematologist in [facility] and was diagnosed with MGUS and had a yearly follow-up for that.  He has now moved to [facility] and has been referred to [place] for continued management of MGUS.  I do not have his outside records from [facility] at this time   Interval history-patient feels well overall.  Denies any specific complaints at this time.  His energy levels are good.  His appetite and weight have remained stable.  Denies any new aches or pains anywhere  ECOG PS- 1 Pain scale- 0 Opioid associated constipation- no  Review of systems- Review of Systems  Constitutional: Negative for chills, fever, malaise/fatigue and weight loss.  HENT: Negative for congestion, ear discharge and nosebleeds.   Eyes: Negative for blurred vision.  Respiratory: Negative for cough, hemoptysis, sputum production, shortness of breath and wheezing.   Cardiovascular: Negative for chest pain, palpitations, orthopnea and claudication.  Gastrointestinal: Negative for abdominal pain, blood in stool, constipation, diarrhea, heartburn, melena, nausea and vomiting.  Genitourinary: Negative for dysuria, flank pain, frequency, hematuria and urgency.  Musculoskeletal: Negative for back pain, joint pain and myalgias.   Skin: Negative for rash.  Neurological: Negative for dizziness, tingling, focal weakness, seizures, weakness and headaches.  Endo/Heme/Allergies: Does not bruise/bleed easily.  Psychiatric/Behavioral: Negative for depression and suicidal ideas. The patient does not have insomnia.       No Known Allergies   Past Medical History:  Diagnosis Date  . Arthritis   . Basal cell carcinoma   . Chickenpox   . History of Clostridium difficile infection   . Hypertension   . MGUS (monoclonal gammopathy of unknown significance)   . Near syncope      Past Surgical History:  Procedure Laterality Date  . BASAL CELL CARCINOMA EXCISION    . HERNIA REPAIR      Social History   Socioeconomic History  . Marital status: Widowed    Spouse name: Not on file  . Number of children: Not on file  . Years of education: Not on file  . Highest education level: Not on file  Occupational History  . Not on file  Social Needs  . Financial resource strain: Not on file  . Food insecurity    Worry: Not on file    Inability: Not on file  . Transportation needs    Medical: Not on file    Non-medical: Not on file  Tobacco Use  . Smoking status: Former Research scientist (life sciences)  . Smokeless tobacco: Never Used  Substance and Sexual Activity  . Alcohol use: Not Currently    Comment: Not drinking any alcohol since c diff diagnosis  . Drug use: No  . Sexual activity: Not Currently  Lifestyle  . Physical activity    Days per week: Not on file  Minutes per session: Not on file  . Stress: Not on file  Relationships  . Social Herbalist on phone: Not on file    Gets together: Not on file    Attends religious service: Not on file    Active member of club or organization: Not on file    Attends meetings of clubs or organizations: Not on file    Relationship status: Not on file  . Intimate partner violence    Fear of current or ex partner: Not on file    Emotionally abused: Not on file    Physically  abused: Not on file    Forced sexual activity: Not on file  Other Topics Concern  . Not on file  Social History Narrative  . Not on file    Family History  Problem Relation Age of Onset  . Arthritis Other   . Stroke Other   . Hypertension Other   . Leukemia Father   . CAD Mother   . Prostate cancer Neg Hx   . Bladder Cancer Neg Hx   . Kidney cancer Neg Hx      Current Outpatient Medications:  .  dexamethasone 0.5 MG/5ML elixir, TAKE 2 TEASPOONFULS (10MLS) BY MOUTH THREE TIMES DAILY (Patient taking differently: 0.5 mg 3 (three) times daily as needed. TAKE 2 TEASPOONFULS (10MLS) BY MOUTH THREE TIMES DAILY), Disp: 474 mL, Rfl: 0  Physical exam:  Vitals:   06/04/19 0946  BP: (!) 168/74  Pulse: 76  Resp: 18  Temp: (!) 96.7 F (35.9 C)  TempSrc: Tympanic  Weight: 141 lb 11.2 oz (64.3 kg)  Height: 5' 8"  (1.727 m)   Physical Exam Constitutional:      General: He is not in acute distress. HENT:     Head: Normocephalic and atraumatic.  Eyes:     Pupils: Pupils are equal, round, and reactive to light.  Neck:     Musculoskeletal: Normal range of motion.  Cardiovascular:     Rate and Rhythm: Normal rate and regular rhythm.     Heart sounds: Normal heart sounds.  Pulmonary:     Effort: Pulmonary effort is normal.     Breath sounds: Normal breath sounds.  Abdominal:     General: Bowel sounds are normal.     Palpations: Abdomen is soft.  Skin:    General: Skin is warm and dry.  Neurological:     Mental Status: He is alert and oriented to person, place, and time.      CMP Latest Ref Rng & Units 06/02/2019  Glucose 70 - 99 mg/dL 115(H)  BUN 8 - 23 mg/dL 16  Creatinine 0.61 - 1.24 mg/dL 0.94  Sodium 135 - 145 mmol/L 134(L)  Potassium 3.5 - 5.1 mmol/L 4.0  Chloride 98 - 111 mmol/L 98  CO2 22 - 32 mmol/L 27  Calcium 8.9 - 10.3 mg/dL 8.6(L)  Total Protein 6.5 - 8.1 g/dL 8.3(H)  Total Bilirubin 0.3 - 1.2 mg/dL 1.1  Alkaline Phos 38 - 126 U/L 69  AST 15 - 41 U/L 20   ALT 0 - 44 U/L 18   CBC Latest Ref Rng & Units 06/02/2019  WBC 4.0 - 10.5 K/uL 6.8  Hemoglobin 13.0 - 17.0 g/dL 13.4  Hematocrit 39.0 - 52.0 % 40.5  Platelets 150 - 400 K/uL 158      Assessment and plan- Patient is a 78 y.o. male with history of light chain MGUS  Review of labs done on 06/02/2019 shows  a normal CBC with an H&H of 13.4/40.5.  CMP is essentially unremarkable with a normal creatinine and normal calcium.  Multiple myeloma panel showed no M protein.  Serum free light chain showed elevated kappa of 78.9 and elevated lambda 41.5 with an elevated serum free light chain ratio of 1.9 which was 1.22 a year ago.  I am inclined to monitor this conservatively at this time without any additional bone marrow biopsy given that he is asymptomatic and no evidence of crab criteria otherwise.  Repeat CBC with differential, CMP, myeloma panel and serum free light chains in 6 months and 1 year and I will see him back in 1 year   Visit Diagnosis 1. MGUS (monoclonal gammopathy of unknown significance)   2. Elevated serum immunoglobulin free light chain level      Dr. Randa Evens, MD, MPH [facility] [phone] 06/05/2019 9:28 AM

## 2019-06-17 ENCOUNTER — Other Ambulatory Visit: Payer: Self-pay

## 2019-06-17 ENCOUNTER — Ambulatory Visit (INDEPENDENT_AMBULATORY_CARE_PROVIDER_SITE_OTHER): Payer: Medicare Other

## 2019-06-17 DIAGNOSIS — Z0000 Encounter for general adult medical examination without abnormal findings: Secondary | ICD-10-CM

## 2019-06-17 NOTE — Progress Notes (Signed)
Subjective:   Henry Mays is a 78 y.o. male who presents for an Initial Medicare Annual Wellness Visit.  Review of Systems  No ROS.  Medicare Wellness Virtual Visit.  Visual/audio telehealth visit, UTA vital signs.   See social history for additional risk factors.   Cardiac Risk Factors include: advanced age (>35men, >75 women);male gender    Objective:    Today's Vitals   There is no height or weight on file to calculate BMI.  Advanced Directives 06/17/2019 06/04/2019 07/04/2018 04/04/2018 07/28/2017 05/02/2017 09/26/2016  Does Patient Have a Medical Advance Directive? Yes Yes Yes Yes No No Yes  Type of Paramedic of [place];Living will [place];Living will Living will;Healthcare Power of Attorney Living will - - Living will  Does patient want to make changes to medical advance directive? No - Patient declined No - Patient declined - No - Patient declined - - No - Patient declined  Copy of [place] in Chart? No - copy requested No - copy requested - - - - No - copy requested  Would patient like information on creating a medical advance directive? - - - - - - -    Current Medications (verified) Outpatient Encounter Medications as of 06/17/2019  Medication Sig  . dexamethasone 0.5 MG/5ML elixir TAKE 2 TEASPOONFULS (10MLS) BY MOUTH THREE TIMES DAILY (Patient taking differently: 0.5 mg 3 (three) times daily as needed. TAKE 2 TEASPOONFULS (10MLS) BY MOUTH THREE TIMES DAILY)   No facility-administered encounter medications on file as of 06/17/2019.     Allergies (verified) Patient has no known allergies.   History: Past Medical History:  Diagnosis Date  . Arthritis   . Basal cell carcinoma   . Chickenpox   . History of Clostridium difficile infection   . Hypertension   . MGUS (monoclonal gammopathy of unknown significance)   . Near syncope    Past Surgical History:  Procedure Laterality Date  . BASAL CELL  CARCINOMA EXCISION    . HERNIA REPAIR     Family History  Problem Relation Age of Onset  . Arthritis Other   . Stroke Other   . Hypertension Other   . Leukemia Father   . CAD Mother   . Prostate cancer Neg Hx   . Bladder Cancer Neg Hx   . Kidney cancer Neg Hx    Social History   Socioeconomic History  . Marital status: Widowed    Spouse name: Not on file  . Number of children: Not on file  . Years of education: Not on file  . Highest education level: Not on file  Occupational History  . Not on file  Social Needs  . Financial resource strain: Not hard at all  . Food insecurity    Worry: Never true    Inability: Never true  . Transportation needs    Medical: No    Non-medical: No  Tobacco Use  . Smoking status: Former Research scientist (life sciences)  . Smokeless tobacco: Never Used  Substance and Sexual Activity  . Alcohol use: Not Currently    Comment: Not drinking any alcohol since c diff diagnosis  . Drug use: No  . Sexual activity: Not Currently  Lifestyle  . Physical activity    Days per week: 7 days    Minutes per session: 30 min  . Stress: Not at all  Relationships  . Social Herbalist on phone: Not on file    Gets together: Not  on file    Attends religious service: Not on file    Active member of club or organization: Not on file    Attends meetings of clubs or organizations: Not on file    Relationship status: Not on file  Other Topics Concern  . Not on file  Social History Narrative  . Not on file   Tobacco Counseling Counseling given: Not Answered   Clinical Intake:  Pre-visit preparation completed: Yes        Diabetes: No  How often do you need to have someone help you when you read instructions, pamphlets, or other written materials from your doctor or pharmacy?: 1 - Never  Interpreter Needed?: No     Activities of Daily Living In your present state of health, do you have any difficulty performing the following activities: 06/17/2019  Hearing?  Y  Comment Hearing aid  Vision? N  Difficulty concentrating or making decisions? N  Walking or climbing stairs? N  Dressing or bathing? N  Doing errands, shopping? N  Preparing Food and eating ? N  Using the Toilet? N  In the past six months, have you accidently leaked urine? N  Do you have problems with loss of bowel control? N  Managing your Medications? N  Managing your Finances? N  Housekeeping or managing your Housekeeping? N  Some recent data might be hidden     Immunizations and Health Maintenance Immunization History  Administered Date(s) Administered  . Hepatitis B, adult 07/24/2016  . Influenza, High Dose Seasonal PF 10/23/2016, 09/17/2017  . Influenza-Unspecified 08/20/2018  . Pneumococcal Conjugate-13 10/10/2017  . Pneumococcal Polysaccharide-23 07/31/2016  . Tdap 07/05/2018   There are no preventive care reminders to display for this patient.  Patient Care Team: Leone Haven, MD as PCP - General (Family Medicine)  Indicate any recent Medical Services you may have received from other than [facility] providers in the past year (date may be approximate).    Assessment:   This is a routine wellness examination for Henry Mays.  I connected with patient 06/17/19 at  9:00 AM EDT by a video/audio enabled telemedicine application and verified that I am speaking with the correct person using two identifiers. Patient stated full name and DOB. Patient gave permission to continue with virtual visit. Patient's location was at home and Nurse's location was at [place] office.   Health Screenings  Bone Density - 09/2018 Glaucoma -none Hearing -hearing aids Hemoglobin A1C - 04/2018 Cholesterol - 06/2018 Dental- visits every 3 months Vision- visits within the last 12 months.  Social  Alcohol intake - no      Smoking history- former    Smokers in home? none Illicit drug use? none Exercise - walking daily 1 mile Diet - heart healthy  Sexually Active -not currently BMI-  discussed the importance of a healthy diet, water intake and the benefits of aerobic exercise.  Educational material provided.   Safety  Patient feels safe at home- yes Patient does have smoke detectors at home- yes Patient does wear sunscreen or protective clothing when in direct sunlight -yes Patient does wear seat belt when in a moving vehicle -yes Patient drives- yes Life alert- yes  Covid-19 precautions and sickness symptoms discussed.   Activities of Daily Living Patient denies needing assistance with: driving, household chores, feeding themselves, getting from bed to chair, getting to the toilet, bathing/showering, dressing, managing money, or preparing meals.  No new identified risk were noted.    Depression Screen Patient denies losing interest  in daily life, feeling hopeless, or crying easily over simple problems.   Medication-taking as directed and without issues.   Fall Screen Patient denies being afraid of falling or falling in the last year.   Memory Screen Patient is alert.  Patient denies difficulty focusing, concentrating or misplacing items. Correctly identified the president of the [place], season and recall. Patient likes to read for brain stimulation.  Immunizations The following Immunizations were discussed: Influenza, shingles, pneumonia, and tetanus.   Other Providers Patient Care Team: Leone Haven, MD as PCP - General (Family Medicine)  Hearing/Vision screen  Hearing Screening   125Hz  250Hz  500Hz  1000Hz  2000Hz  3000Hz  4000Hz  6000Hz  8000Hz   Right ear:           Left ear:           Comments: Hearing aids  Vision Screening Comments: Wears corrective lenses Visual acuity not assessed, virtual visit.  They have seen their ophthalmologist in the last 12 months.    Dietary issues and exercise activities discussed: Current Exercise Habits: Home exercise routine, Type of exercise: walking, Intensity: Moderate  Goals      Patient Stated   .  Increase physical activity (pt-stated)     Use the treadmill for exercise Upper body strength exercises      Depression Screen PHQ 2/9 Scores 06/17/2019 04/01/2018 09/07/2016  PHQ - 2 Score 0 0 0    Fall Risk Fall Risk  06/17/2019 04/01/2018 09/07/2016  Falls in the past year? 0 No Yes  Injury with Fall? - - Yes  Risk Factor Category  - - High Fall Risk  Risk for fall due to : - - History of fall(s)  Cognitive Function:     6CIT Screen 06/17/2019  What Year? 0 points  What month? 0 points  What time? 0 points  Count back from 20 0 points  Months in reverse 0 points  Repeat phrase 0 points  Total Score 0    Screening Tests Health Maintenance  Topic Date Due  . INFLUENZA VACCINE  07/11/2019  . TETANUS/TDAP  07/05/2028  . PNA vac Low Risk Adult  Completed       Plan:    End of life planning; Advance aging; Advanced directives discussed.  Copy of current HCPOA/Living Will requested.    I have personally reviewed and noted the following in the patient's chart:   . Medical and social history . Use of alcohol, tobacco or illicit drugs  . Current medications and supplements . Functional ability and status . Nutritional status . Physical activity . Advanced directives . List of other physicians . Hospitalizations, surgeries, and ER visits in previous 12 months . Vitals . Screenings to include cognitive, depression, and falls . Referrals and appointments  In addition, I have reviewed and discussed with patient certain preventive protocols, quality metrics, and best practice recommendations. A written personalized care plan for preventive services as well as general preventive health recommendations were provided to patient.     Varney Biles, LPN   4/0/3474

## 2019-06-17 NOTE — Patient Instructions (Addendum)
  Mr. Henry Mays , Thank you for taking time to come for your Medicare Wellness Visit. I appreciate your ongoing commitment to your health goals. Please review the following plan we discussed and let me know if I can assist you in the future.   These are the goals we discussed: Goals      Patient Stated   . Increase physical activity (pt-stated)     Use the treadmill for exercise Upper body strength exercises       This is a list of the screening recommended for you and due dates:  Health Maintenance  Topic Date Due  . Flu Shot  07/11/2019  . Tetanus Vaccine  07/05/2028  . Pneumonia vaccines  Completed

## 2019-06-17 NOTE — Progress Notes (Signed)
I have reviewed the above note and agree.  Maranda Marte, M.D.  

## 2019-07-20 ENCOUNTER — Other Ambulatory Visit: Payer: Self-pay | Admitting: Family Medicine

## 2019-07-21 ENCOUNTER — Other Ambulatory Visit: Payer: Self-pay

## 2019-07-21 ENCOUNTER — Telehealth: Payer: Self-pay | Admitting: Family Medicine

## 2019-07-21 NOTE — Telephone Encounter (Signed)
Pt called stating he needs a refill for dexamethasone 0.5 MG/5ML elixir and tetracaine POWD.  Pharmacy is [facility] [address]  Pt has been scheduled for 08/03/2019  Call pt @ [phone] or [phone]. Thank you!

## 2019-07-23 DIAGNOSIS — L570 Actinic keratosis: Secondary | ICD-10-CM | POA: Diagnosis not present

## 2019-07-23 DIAGNOSIS — C434 Malignant melanoma of scalp and neck: Secondary | ICD-10-CM | POA: Diagnosis not present

## 2019-07-23 DIAGNOSIS — Z85828 Personal history of other malignant neoplasm of skin: Secondary | ICD-10-CM | POA: Diagnosis not present

## 2019-07-23 DIAGNOSIS — X32XXXA Exposure to sunlight, initial encounter: Secondary | ICD-10-CM | POA: Diagnosis not present

## 2019-07-23 DIAGNOSIS — Z08 Encounter for follow-up examination after completed treatment for malignant neoplasm: Secondary | ICD-10-CM | POA: Diagnosis not present

## 2019-07-23 DIAGNOSIS — C4442 Squamous cell carcinoma of skin of scalp and neck: Secondary | ICD-10-CM | POA: Diagnosis not present

## 2019-07-23 DIAGNOSIS — D485 Neoplasm of uncertain behavior of skin: Secondary | ICD-10-CM | POA: Diagnosis not present

## 2019-07-23 DIAGNOSIS — L820 Inflamed seborrheic keratosis: Secondary | ICD-10-CM | POA: Diagnosis not present

## 2019-07-23 DIAGNOSIS — C44612 Basal cell carcinoma of skin of right upper limb, including shoulder: Secondary | ICD-10-CM | POA: Diagnosis not present

## 2019-07-23 NOTE — Telephone Encounter (Signed)
Please contact the pharmacy regarding the tetracaine powder. That is not a medication I have prescribed previously. Please if this has been filled at their pharmacy for this patient previously.

## 2019-07-27 MED ORDER — DEXAMETHASONE 0.5 MG/5ML PO ELIX: ORAL_SOLUTION | ORAL | 0 refills | Status: DC

## 2019-07-27 NOTE — Telephone Encounter (Signed)
Patient said he was getting this in [place] and that your name is on the prescription he has it is a compounded solution.

## 2019-07-27 NOTE — Telephone Encounter (Signed)
There is a message in the refill encounter to contact the pharmacy regarding the tetracaine powder. This is not a medication that I have prescribed previously. Please find out if the pharmacy has filled this for the patient previously. Please find out from the patient who he last got this medication from. Thanks.

## 2019-07-27 NOTE — Telephone Encounter (Signed)
I called [facility] about this medication and she states taht you can put on the Rx 1 teaspoon full 3 times per day with a quanity of 120 ml and make sure you put it is a compound on the bottom of the RX.  Henry Mays,cma

## 2019-07-27 NOTE — Addendum Note (Signed)
Addended by: Caryl Bis, ERIC G on: 07/27/2019 12:52 PM   Modules accepted: Orders

## 2019-07-27 NOTE — Telephone Encounter (Signed)
There is not an option for milliliters in this prescription.  Can you have them fax [place] a prescription request for this medication with the instructions on the prescription request and then I can sign it?  Thanks.

## 2019-07-27 NOTE — Telephone Encounter (Signed)
Please see the refill request.

## 2019-07-27 NOTE — Telephone Encounter (Signed)
Pt stated he contacted his pharmacy and was advised that the refill requests have not be approved. Pt requests that the refill requests be sent to  [facility] [place]

## 2019-07-28 NOTE — Telephone Encounter (Signed)
[facility] faxed over the refill request for this patient for Tetracaine 2% oral solution, I put it in your signed box to complete and send to [facility].  Addisynn Vassell,cma

## 2019-07-28 NOTE — Telephone Encounter (Signed)
Assured patient medication has been refaxed to [facility].

## 2019-08-03 ENCOUNTER — Other Ambulatory Visit: Payer: Self-pay

## 2019-08-03 ENCOUNTER — Encounter: Payer: Self-pay | Admitting: Family Medicine

## 2019-08-03 ENCOUNTER — Ambulatory Visit (INDEPENDENT_AMBULATORY_CARE_PROVIDER_SITE_OTHER): Payer: Medicare Other | Admitting: Family Medicine

## 2019-08-03 DIAGNOSIS — Q046 Congenital cerebral cysts: Secondary | ICD-10-CM

## 2019-08-03 DIAGNOSIS — M8080XA Other osteoporosis with current pathological fracture, unspecified site, initial encounter for fracture: Secondary | ICD-10-CM

## 2019-08-03 DIAGNOSIS — R972 Elevated prostate specific antigen [PSA]: Secondary | ICD-10-CM

## 2019-08-03 DIAGNOSIS — K1379 Other lesions of oral mucosa: Secondary | ICD-10-CM

## 2019-08-03 DIAGNOSIS — C4490 Unspecified malignant neoplasm of skin, unspecified: Secondary | ICD-10-CM | POA: Insufficient documentation

## 2019-08-03 NOTE — Assessment & Plan Note (Signed)
Last PSA was age-appropriate.  Patient defers screening.

## 2019-08-03 NOTE — Progress Notes (Signed)
Virtual Visit via telephone Note  This visit type was conducted due to national recommendations for restrictions regarding the COVID-19 pandemic (e.g. social distancing).  This format is felt to be most appropriate for this patient at this time.  All issues noted in this document were discussed and addressed.  No physical exam was performed (except for noted visual exam findings with Video Visits).   I connected with Henry Mays today at  4:30 PM EDT by telephone and verified that I am speaking with the correct person using two identifiers. Location patient: home Location provider: work Persons participating in the virtual visit: patient, provider  I discussed the limitations, risks, security and privacy concerns of performing an evaluation and management service by telephone and the availability of in person appointments. I also discussed with the patient that there may be a patient responsible charge related to this service. The patient expressed understanding and agreed to proceed.  Interactive audio and video telecommunications were attempted between this provider and patient, however failed, due to patient having technical difficulties OR patient did not have access to video capability.  We continued and completed visit with audio only.   Reason for visit: follow-up  HPI: Mouth ulcers: Patient notes these come and go.  Relatively well controlled with dexamethasone and using tetracaine when he develops some pain from ulcers.  He had an extensive evaluation through [facility] many years ago with no obvious cause found.  He has been on a stable regimen for some time now.  Colloid cyst: No headaches.  He notes he is doing quite well with regards to this.  He is seeing neurology once yearly and having an MRI once yearly.  Skin cancer: Patient notes he saw dermatology about a week ago.  He has 1 placed on his head for which he has to has Mohs surgery.  He is going to see Dr. Manley Mason in [place].  Osteoporosis: He continues on Prolia.  No fractures.  He has not been taking calcium or vitamin D.  Elevated PSA: Patient followed up with urology and notes they advised that he did not have to come back given his age.  PSA was in an age-appropriate range.  Patient's wife passed away.  He has been doing fairly well with regards to this.  He does miss her though notes he is not depressed.  He does have good support from his family.   ROS: See pertinent positives and negatives per HPI.  Past Medical History:  Diagnosis Date  . Arthritis   . Basal cell carcinoma   . Chickenpox   . History of Clostridium difficile infection   . Hypertension   . MGUS (monoclonal gammopathy of unknown significance)   . Near syncope     Past Surgical History:  Procedure Laterality Date  . BASAL CELL CARCINOMA EXCISION    . HERNIA REPAIR      Family History  Problem Relation Age of Onset  . Arthritis Other   . Stroke Other   . Hypertension Other   . Leukemia Father   . CAD Mother   . Prostate cancer Neg Hx   . Bladder Cancer Neg Hx   . Kidney cancer Neg Hx     SOCIAL HX: Former smoker.   Current Outpatient Medications:  .  dexamethasone 0.5 MG/5ML elixir, TAKE 2 TEASPOONFULS (10MLS) BY MOUTH THREE TIMES DAILY, Disp: 474 mL, Rfl: 0 .  tetracaine POWD, , Disp: , Rfl:   EXAM: This was a telehealth  telephone visit and thus no physical exam was completed.  ASSESSMENT AND PLAN:  Discussed the following assessment and plan:  Colloid cyst of brain ([place]) Most recent MRI revealed stable cyst.  He will continue to see neurology.  Osteoporosis Continue Prolia.  Encouraged him to take appropriate vitamin D and calcium supplementation over-the-counter.  Elevated PSA Last PSA was age-appropriate.  Patient defers screening.  Mouth sores Continue current regimen.  This has been working quite well.  Skin cancer He will see Dr. Manley Mason and continue to follow with his local dermatologist  as planned.   Offered support regarding the death of his wife.  He will let us know if he needs any additional support.   I discussed the assessment and treatment plan with the patient. The patient was provided an opportunity to ask questions and all were answered. The patient agreed with the plan and demonstrated an understanding of the instructions.   The patient was advised to call back or seek an in-person evaluation if the symptoms worsen or if the condition fails to improve as anticipated.  I provided 16 minutes of non-face-to-face time during this encounter.   Tommi Rumps, MD

## 2019-08-03 NOTE — Assessment & Plan Note (Signed)
He will see Dr. Manley Mason and continue to follow with his local dermatologist as planned.

## 2019-08-03 NOTE — Assessment & Plan Note (Signed)
Most recent MRI revealed stable cyst.  He will continue to see neurology.

## 2019-08-03 NOTE — Assessment & Plan Note (Signed)
Continue current regimen.  This has been working quite well.

## 2019-08-03 NOTE — Assessment & Plan Note (Signed)
Continue Prolia.  Encouraged him to take appropriate vitamin D and calcium supplementation over-the-counter.

## 2019-08-04 ENCOUNTER — Encounter: Payer: Self-pay | Admitting: Family Medicine

## 2019-08-04 ENCOUNTER — Telehealth: Payer: Self-pay | Admitting: Family Medicine

## 2019-08-04 NOTE — Telephone Encounter (Signed)
I sent a my chart and mailed a letter to inform the patient to follow up in 1 year. I was unable to reach patient via phone.

## 2019-08-13 ENCOUNTER — Other Ambulatory Visit: Payer: Self-pay

## 2019-08-13 ENCOUNTER — Ambulatory Visit (INDEPENDENT_AMBULATORY_CARE_PROVIDER_SITE_OTHER): Payer: Medicare Other

## 2019-08-13 DIAGNOSIS — Z23 Encounter for immunization: Secondary | ICD-10-CM

## 2019-08-27 ENCOUNTER — Other Ambulatory Visit: Payer: Self-pay

## 2019-08-28 ENCOUNTER — Other Ambulatory Visit: Payer: Self-pay

## 2019-08-28 ENCOUNTER — Ambulatory Visit (INDEPENDENT_AMBULATORY_CARE_PROVIDER_SITE_OTHER): Payer: Medicare Other | Admitting: Family Medicine

## 2019-08-28 ENCOUNTER — Encounter: Payer: Self-pay | Admitting: Family Medicine

## 2019-08-28 DIAGNOSIS — H6123 Impacted cerumen, bilateral: Secondary | ICD-10-CM | POA: Diagnosis not present

## 2019-08-28 DIAGNOSIS — Z9181 History of falling: Secondary | ICD-10-CM | POA: Insufficient documentation

## 2019-08-28 DIAGNOSIS — H6120 Impacted cerumen, unspecified ear: Secondary | ICD-10-CM | POA: Insufficient documentation

## 2019-08-28 NOTE — Patient Instructions (Signed)
Nice to see you. Please let us know if you have any further issues with your ears.

## 2019-08-28 NOTE — Assessment & Plan Note (Signed)
No recent falls in the last year.  He is doing well overall.

## 2019-08-28 NOTE — Progress Notes (Addendum)
  Tommi Rumps, MD Phone: [phone]  Henry Mays is a 78 y.o. male who presents today for same day visit.   Left ear fullness: Patient notes this is been going on for least 3 days.  He has some decreased hearing even with his hearing aids in place.  He notes no tinnitus.  No ear pain.  He has seen the nurse at his living facility and they attempted to clean this out though they were unable to get everything out.  History of falls: He reports no falls in the last year.  Social History   Tobacco Use  Smoking Status Former Smoker  Smokeless Tobacco Never Used     ROS see history of present illness  Objective  Physical Exam Vitals:   08/28/19 0808  BP: 120/78  Pulse: 89  Temp: 98.5 F (36.9 C)  SpO2: 99%    BP Readings from Last 3 Encounters:  08/28/19 120/78  06/04/19 (!) 168/74  01/08/19 (!) 168/65   Wt Readings from Last 3 Encounters:  08/28/19 138 lb 6.4 oz (62.8 kg)  08/03/19 140 lb (63.5 kg)  06/04/19 141 lb 11.2 oz (64.3 kg)    Physical Exam Constitutional:      General: He is not in acute distress.    Appearance: He is not diaphoretic.  HENT:     Right Ear: Ear canal normal. There is impacted cerumen.     Left Ear: Ear canal normal. There is impacted cerumen.  Cardiovascular:     Rate and Rhythm: Normal rate and regular rhythm.     Heart sounds: Normal heart sounds.  Pulmonary:     Effort: Pulmonary effort is normal.  Skin:    General: Skin is warm and dry.  Neurological:     Mental Status: He is alert.      Assessment/Plan: Please see individual problem list.  Cerumen impaction Bilateral ears. Discussed ear lavage with patient. Patient agreed to this procedure. Area irrigated by CMA.  Patient tolerated this well.  All cerumen was removed with irrigation. Normal TMs seen. Patient tolerated this well. He will monitor for recurrence.   Personal history of fall No recent falls in the last year.  He is doing well overall.   No orders of  the defined types were placed in this encounter.   No orders of the defined types were placed in this encounter.    Tommi Rumps, MD [place]

## 2019-08-28 NOTE — Assessment & Plan Note (Addendum)
Bilateral ears. Discussed ear lavage with patient. Patient agreed to this procedure. Area irrigated by CMA.  Patient tolerated this well.  All cerumen was removed with irrigation. Normal TMs seen. Patient tolerated this well. He will monitor for recurrence.

## 2019-09-01 DIAGNOSIS — C4442 Squamous cell carcinoma of skin of scalp and neck: Secondary | ICD-10-CM | POA: Diagnosis not present

## 2019-09-01 DIAGNOSIS — L814 Other melanin hyperpigmentation: Secondary | ICD-10-CM | POA: Diagnosis not present

## 2019-09-01 DIAGNOSIS — L578 Other skin changes due to chronic exposure to nonionizing radiation: Secondary | ICD-10-CM | POA: Diagnosis not present

## 2019-09-01 DIAGNOSIS — L988 Other specified disorders of the skin and subcutaneous tissue: Secondary | ICD-10-CM | POA: Diagnosis not present

## 2019-09-01 DIAGNOSIS — C434 Malignant melanoma of scalp and neck: Secondary | ICD-10-CM | POA: Diagnosis not present

## 2019-09-18 DIAGNOSIS — C44612 Basal cell carcinoma of skin of right upper limb, including shoulder: Secondary | ICD-10-CM | POA: Diagnosis not present

## 2019-09-24 ENCOUNTER — Other Ambulatory Visit: Payer: Self-pay

## 2019-09-24 ENCOUNTER — Ambulatory Visit (INDEPENDENT_AMBULATORY_CARE_PROVIDER_SITE_OTHER): Payer: Medicare Other

## 2019-09-24 ENCOUNTER — Telehealth: Payer: Self-pay

## 2019-09-24 DIAGNOSIS — M810 Age-related osteoporosis without current pathological fracture: Secondary | ICD-10-CM | POA: Diagnosis not present

## 2019-09-24 MED ORDER — DENOSUMAB 60 MG/ML ~~LOC~~ SOSY
60.0000 mg | PREFILLED_SYRINGE | Freq: Once | SUBCUTANEOUS | Status: AC
  Administered 2019-09-24: 60 mg via SUBCUTANEOUS

## 2019-09-24 NOTE — Telephone Encounter (Signed)
Patient was in the office today for Prolia injection today and c/o of having hiccups. Patient said that this started on yesterday. Patient said that in the past Dr. Caryl Bis called in a Rx for the hiccups and wanted to know if he could send in another Rx.  Patient request Rx be sent to [facility].

## 2019-09-24 NOTE — Progress Notes (Signed)
Patient presented today for Prolia injection. Administered SQ in the left arm.  Patient tolerated well with no signs of distress.

## 2019-09-25 MED ORDER — CHLORPROMAZINE HCL 10 MG PO TABS: 10.0000 mg | ORAL_TABLET | Freq: Three times a day (TID) | ORAL | 0 refills | Status: DC | PRN

## 2019-09-25 NOTE — Telephone Encounter (Signed)
Thorazine sent to pharmacy. He should try breath holding to see if that will resolve them though if they do not resolve with that he can try the thorazine.

## 2019-09-25 NOTE — Telephone Encounter (Signed)
Please contact the patient and see if he is still having hiccups. Please find out how long he has had them and if he has tried any physical maneuvers to stop them. Thanks.

## 2019-09-25 NOTE — Telephone Encounter (Signed)
I called and spoke with the pateint and he states that yes he is still having the hiccups and he has had them now for 2-3 days. Patient states he had to sleep sitting up because they got worse when he laid down.  He has drank 1/2 bottle of apple cider vinegar and it has not helped.  He also states that a while back you prescribed him some medication for this when it happened before and it helped, if you could provide this medication send it to [facility].  Starkeisha Vanwinkle,cma

## 2019-09-28 NOTE — Telephone Encounter (Signed)
I called and spoke with the pateitn and he stated he picked up the medication and the hiccups are resolved.  Kimberl Vig,cma

## 2019-11-24 NOTE — Telephone Encounter (Signed)
Opened  In error by CMA

## 2019-12-05 ENCOUNTER — Other Ambulatory Visit: Payer: Self-pay | Admitting: Family Medicine

## 2019-12-07 ENCOUNTER — Inpatient Hospital Stay: Payer: Medicare Other | Attending: Oncology

## 2019-12-07 ENCOUNTER — Other Ambulatory Visit: Payer: Self-pay

## 2019-12-07 DIAGNOSIS — I10 Essential (primary) hypertension: Secondary | ICD-10-CM | POA: Insufficient documentation

## 2019-12-07 DIAGNOSIS — Z85828 Personal history of other malignant neoplasm of skin: Secondary | ICD-10-CM | POA: Diagnosis not present

## 2019-12-07 DIAGNOSIS — Z9181 History of falling: Secondary | ICD-10-CM | POA: Insufficient documentation

## 2019-12-07 DIAGNOSIS — Z8781 Personal history of (healed) traumatic fracture: Secondary | ICD-10-CM | POA: Insufficient documentation

## 2019-12-07 DIAGNOSIS — D472 Monoclonal gammopathy: Secondary | ICD-10-CM | POA: Insufficient documentation

## 2019-12-07 DIAGNOSIS — Z8619 Personal history of other infectious and parasitic diseases: Secondary | ICD-10-CM | POA: Diagnosis not present

## 2019-12-07 DIAGNOSIS — R768 Other specified abnormal immunological findings in serum: Secondary | ICD-10-CM

## 2019-12-07 DIAGNOSIS — Z87891 Personal history of nicotine dependence: Secondary | ICD-10-CM | POA: Insufficient documentation

## 2019-12-07 LAB — COMPREHENSIVE METABOLIC PANEL
ALT: 14 U/L (ref 0–44)
AST: 21 U/L (ref 15–41)
Albumin: 3.3 g/dL — ABNORMAL LOW (ref 3.5–5.0)
Alkaline Phosphatase: 55 U/L (ref 38–126)
Anion gap: 8 (ref 5–15)
BUN: 9 mg/dL (ref 8–23)
CO2: 27 mmol/L (ref 22–32)
Calcium: 8.7 mg/dL — ABNORMAL LOW (ref 8.9–10.3)
Chloride: 99 mmol/L (ref 98–111)
Creatinine, Ser: 0.81 mg/dL (ref 0.61–1.24)
GFR calc Af Amer: >60 mL/min (ref >60)
GFR calc non Af Amer: >60 mL/min (ref >60)
Glucose, Bld: 132 mg/dL — ABNORMAL HIGH (ref 70–99)
Potassium: 3.9 mmol/L (ref 3.5–5.1)
Sodium: 134 mmol/L — ABNORMAL LOW (ref 135–145)
Total Bilirubin: 0.8 mg/dL (ref 0.3–1.2)
Total Protein: 8.5 g/dL — ABNORMAL HIGH (ref 6.5–8.1)

## 2019-12-07 LAB — CBC WITH DIFFERENTIAL/PLATELET
Abs Immature Granulocytes: 0.06 10*3/uL (ref 0.00–0.07)
Basophils Absolute: 0.1 10*3/uL (ref 0.0–0.1)
Basophils Relative: 1 %
Eosinophils Absolute: 0 10*3/uL (ref 0.0–0.5)
Eosinophils Relative: 0 %
HCT: 37.7 % — ABNORMAL LOW (ref 39.0–52.0)
Hemoglobin: 12 g/dL — ABNORMAL LOW (ref 13.0–17.0)
Immature Granulocytes: 1 %
Lymphocytes Relative: 14 %
Lymphs Abs: 1.3 10*3/uL (ref 0.7–4.0)
MCH: 31.2 pg (ref 26.0–34.0)
MCHC: 31.8 g/dL (ref 30.0–36.0)
MCV: 97.9 fL (ref 80.0–100.0)
Monocytes Absolute: 1.2 10*3/uL — ABNORMAL HIGH (ref 0.1–1.0)
Monocytes Relative: 14 %
Neutro Abs: 6.4 10*3/uL (ref 1.7–7.7)
Neutrophils Relative %: 70 %
Platelets: 278 10*3/uL (ref 150–400)
RBC: 3.85 MIL/uL — ABNORMAL LOW (ref 4.22–5.81)
RDW: 13.2 % (ref 11.5–15.5)
WBC: 9 10*3/uL (ref 4.0–10.5)
nRBC: 0 % (ref 0.0–0.2)

## 2019-12-08 LAB — MULTIPLE MYELOMA PANEL, SERUM
Albumin SerPl Elph-Mcnc: 3.5 g/dL (ref 2.9–4.4)
Albumin/Glob SerPl: 0.8 (ref 0.7–1.7)
Alpha 1: 0.3 g/dL (ref 0.0–0.4)
Alpha2 Glob SerPl Elph-Mcnc: 0.7 g/dL (ref 0.4–1.0)
B-Globulin SerPl Elph-Mcnc: 1 g/dL (ref 0.7–1.3)
Gamma Glob SerPl Elph-Mcnc: 2.4 g/dL — ABNORMAL HIGH (ref 0.4–1.8)
Globulin, Total: 4.4 g/dL — ABNORMAL HIGH (ref 2.2–3.9)
IgA: 911 mg/dL — ABNORMAL HIGH (ref 61–437)
IgG (Immunoglobin G), Serum: 2323 mg/dL — ABNORMAL HIGH (ref 603–1613)
IgM (Immunoglobulin M), Srm: 70 mg/dL (ref 15–143)
Total Protein ELP: 7.9 g/dL (ref 6.0–8.5)

## 2019-12-08 LAB — KAPPA/LAMBDA LIGHT CHAINS
Kappa free light chain: 81.5 mg/L — ABNORMAL HIGH (ref 3.3–19.4)
Kappa, lambda light chain ratio: 0.78 (ref 0.26–1.65)
Lambda free light chains: 104.9 mg/L — ABNORMAL HIGH (ref 5.7–26.3)

## 2019-12-10 NOTE — Telephone Encounter (Signed)
Refilled: 07/27/2019 Last OV: 08/28/2019 Next OV: 08/10/2020

## 2019-12-16 ENCOUNTER — Emergency Department
Admission: EM | Admit: 2019-12-16 | Discharge: 2019-12-16 | Disposition: A | Discharge status: Home / Self Care | Payer: Medicare Other | Attending: Emergency Medicine | Admitting: Emergency Medicine

## 2019-12-16 ENCOUNTER — Emergency Department: Payer: Medicare Other

## 2019-12-16 ENCOUNTER — Other Ambulatory Visit: Payer: Self-pay

## 2019-12-16 DIAGNOSIS — S51812A Laceration without foreign body of left forearm, initial encounter: Secondary | ICD-10-CM | POA: Diagnosis not present

## 2019-12-16 DIAGNOSIS — Y9389 Activity, other specified: Secondary | ICD-10-CM | POA: Insufficient documentation

## 2019-12-16 DIAGNOSIS — S51802A Unspecified open wound of left forearm, initial encounter: Secondary | ICD-10-CM | POA: Insufficient documentation

## 2019-12-16 DIAGNOSIS — Y92002 Bathroom of unspecified non-institutional (private) residence single-family (private) house as the place of occurrence of the external cause: Secondary | ICD-10-CM | POA: Insufficient documentation

## 2019-12-16 DIAGNOSIS — S51811A Laceration without foreign body of right forearm, initial encounter: Secondary | ICD-10-CM | POA: Diagnosis not present

## 2019-12-16 DIAGNOSIS — S0083XA Contusion of other part of head, initial encounter: Secondary | ICD-10-CM | POA: Insufficient documentation

## 2019-12-16 DIAGNOSIS — R5381 Other malaise: Secondary | ICD-10-CM | POA: Diagnosis not present

## 2019-12-16 DIAGNOSIS — Y999 Unspecified external cause status: Secondary | ICD-10-CM | POA: Insufficient documentation

## 2019-12-16 DIAGNOSIS — R58 Hemorrhage, not elsewhere classified: Secondary | ICD-10-CM | POA: Diagnosis not present

## 2019-12-16 DIAGNOSIS — Z87891 Personal history of nicotine dependence: Secondary | ICD-10-CM | POA: Diagnosis not present

## 2019-12-16 DIAGNOSIS — I10 Essential (primary) hypertension: Secondary | ICD-10-CM | POA: Insufficient documentation

## 2019-12-16 DIAGNOSIS — R0902 Hypoxemia: Secondary | ICD-10-CM | POA: Diagnosis not present

## 2019-12-16 DIAGNOSIS — T148XXA Other injury of unspecified body region, initial encounter: Secondary | ICD-10-CM

## 2019-12-16 DIAGNOSIS — S0990XA Unspecified injury of head, initial encounter: Secondary | ICD-10-CM

## 2019-12-16 DIAGNOSIS — W010XXA Fall on same level from slipping, tripping and stumbling without subsequent striking against object, initial encounter: Secondary | ICD-10-CM | POA: Diagnosis not present

## 2019-12-16 DIAGNOSIS — S51801A Unspecified open wound of right forearm, initial encounter: Secondary | ICD-10-CM | POA: Diagnosis not present

## 2019-12-16 DIAGNOSIS — Z743 Need for continuous supervision: Secondary | ICD-10-CM | POA: Diagnosis not present

## 2019-12-16 DIAGNOSIS — R279 Unspecified lack of coordination: Secondary | ICD-10-CM | POA: Diagnosis not present

## 2019-12-16 DIAGNOSIS — W19XXXA Unspecified fall, initial encounter: Secondary | ICD-10-CM | POA: Diagnosis not present

## 2019-12-16 DIAGNOSIS — S199XXA Unspecified injury of neck, initial encounter: Secondary | ICD-10-CM | POA: Diagnosis not present

## 2019-12-16 NOTE — ED Provider Notes (Signed)
[facility] Emergency Department Provider Note   ____________________________________________   First MD Initiated Contact with Patient 12/16/19 0116     (approximate)  I have reviewed the triage vital signs and the nursing notes.   HISTORY  Chief Complaint Fall    HPI Henry Mays is a 79 y.o. male brought to the ED via EMS from home status post mechanical fall.  Patient was going to the bathroom and lost his balance.  Presents with hematoma to right forehead and skin tears to bilateral forearms.  Denies LOC. Voices no complaints of pain.  Denies anticoagulant use.  Tetanus is up-to-date.  Denies headache, vision changes, neck pain, chest pain, shortness of breath, abdominal pain, hip pain, nausea, vomiting or dizziness.  Declines Tylenol.       Past Medical History:  Diagnosis Date  . Arthritis   . Basal cell carcinoma   . Chickenpox   . History of Clostridium difficile infection   . Hypertension   . MGUS (monoclonal gammopathy of unknown significance)   . Near syncope     Patient Active Problem List   Diagnosis Date Noted  . Cerumen impaction 08/28/2019  . Personal history of fall 08/28/2019  . Skin cancer 08/03/2019  . Elevated PSA 07/04/2018  . Mouth sores 07/04/2018  . Dental anomaly 04/01/2018  . Plantar wart of right foot 05/14/2017  . PVC (premature ventricular contraction) 08/28/2016  . Intractable hiccups 08/07/2016  . Colloid cyst of brain ([place]) 05/22/2016  . Osteoporosis 05/22/2016  . Decreased right shoulder range of motion 04/10/2016  . MGUS (monoclonal gammopathy of unknown significance) 04/10/2016    Past Surgical History:  Procedure Laterality Date  . BASAL CELL CARCINOMA EXCISION    . HERNIA REPAIR      Prior to Admission medications   Medication Sig Start Date End Date Taking? Authorizing Provider  dexamethasone 0.5 MG/5ML elixir TAKE 2 TEASPONFULS BY MOUTH 3 TIMES DAILY 12/10/19   Leone Haven, MD    chlorproMAZINE (THORAZINE) 10 MG tablet Take 1 tablet (10 mg total) by mouth 3 (three) times daily as needed for hiccoughs. 09/25/19   Leone Haven, MD  tetracaine POWD  04/09/19   [provider]    Allergies Patient has no known allergies.  Family History  Problem Relation Age of Onset  . Arthritis Other   . Stroke Other   . Hypertension Other   . Leukemia Father   . CAD Mother   . Prostate cancer Neg Hx   . Bladder Cancer Neg Hx   . Kidney cancer Neg Hx     Social History Social History   Tobacco Use  . Smoking status: Former Research scientist (life sciences)  . Smokeless tobacco: Never Used  Substance Use Topics  . Alcohol use: Not Currently    Comment: Not drinking any alcohol since c diff diagnosis  . Drug use: No    Review of Systems  Constitutional: No fever/chills Eyes: No visual changes. ENT: No sore throat. Cardiovascular: Denies chest pain. Respiratory: Denies shortness of breath. Gastrointestinal: No abdominal pain.  No nausea, no vomiting.  No diarrhea.  No constipation. Genitourinary: Negative for dysuria. Musculoskeletal: Negative for back pain. Skin: Positive for multiple skin tears.  Negative for rash. Neurological: Positive for right forehead hematoma.  Negative for headaches, focal weakness or numbness.   ____________________________________________   PHYSICAL EXAM:  VITAL SIGNS: ED Triage Vitals [12/16/19 0107]  Enc Vitals Group     BP      Pulse  Resp      Temp      Temp src      SpO2      Weight 133 lb (60.3 kg)     Height 5\' 8"  (1.727 m)     Head Circumference      Peak Flow      Pain Score 1     Pain Loc      Pain Edu?      Excl. in [place]?     Constitutional: Alert and oriented.  Elderly appearing and in no acute distress. Eyes: Conjunctivae are normal. PERRL. EOMI. Head: Small right forehead hematoma.  Multiple skin tears.  Post skin biopsy wound to right parietal scalp with pre-existing open wound now with some venous  oozing. Nose: Atraumatic. Mouth/Throat: Mucous membranes are moist.  No dental malocclusion. Neck: No stridor.  No cervical spine tenderness to palpation. Cardiovascular: Normal rate, regular rhythm. Grossly normal heart sounds.  Good peripheral circulation. Respiratory: Normal respiratory effort.  No retractions. Lungs CTAB. Gastrointestinal: Soft and nontender to light or deep palpation. No distention. No abdominal bruits. No CVA tenderness. Musculoskeletal: No spinal tenderness to palpation.  Pelvis is stable.  Multiple skin tears to bilateral forearms.  No lower extremity tenderness nor edema.  No joint effusions. Neurologic:  Normal speech and language. No gross focal neurologic deficits are appreciated.  Skin:  Skin is warm, dry and intact. No rash noted. Psychiatric: Mood and affect are normal. Speech and behavior are normal.  ____________________________________________   LABS (all labs ordered are listed, but only abnormal results are displayed)  Labs Reviewed - No data to display ____________________________________________  EKG  None ____________________________________________  RADIOLOGY  ED MD interpretation: No ICH, stable colloid cyst; no C-spine injury  Official radiology report(s): CT Head Wo Contrast  Result Date: 12/16/2019 CLINICAL DATA:  Fall EXAM: CT HEAD WITHOUT CONTRAST CT CERVICAL SPINE WITHOUT CONTRAST TECHNIQUE: Multidetector CT imaging of the head and cervical spine was performed following the standard protocol without intravenous contrast. Multiplanar CT image reconstructions of the cervical spine were also generated. COMPARISON:  Head CT 07/04/2018 FINDINGS: CT HEAD FINDINGS Brain: Unchanged intraventricular mass near the left foramen of Monro measuring 16 mm, consistent with colloid cyst. No hemorrhage or extra-axial collection. No hydrocephalus or midline shift. There is generalized atrophy without lobar predilection. The brain parenchyma is normal,  without evidence of acute or chronic infarction. Vascular: Atherosclerotic calcification of the internal carotid arteries at the skull base. No abnormal hyperdensity of the major intracranial arteries or dural venous sinuses. Skull: The visualized skull base, calvarium and extracranial soft tissues are normal. Sinuses/Orbits: No fluid levels or advanced mucosal thickening of the visualized paranasal sinuses. No mastoid or middle ear effusion. The orbits are normal. CT CERVICAL SPINE FINDINGS Alignment: Grade 1 anterolisthesis at C4-5 Skull base and vertebrae: No acute fracture. Soft tissues and spinal canal: No prevertebral fluid or swelling. No visible canal hematoma. Disc levels: No spinal canal stenosis. Disc space narrowing is worst at C5-6. Multilevel facet hypertrophy. Upper chest: No pneumothorax, pulmonary nodule or pleural effusion. Other: Normal visualized paraspinal cervical soft tissues. IMPRESSION: 1. Unchanged colloid cyst at the left foramen of Monro without hydrocephalus or other acute intracranial abnormality. 2. No acute fracture of the cervical spine. Electronically Signed   By: Ulyses Jarred M.D.   On: 12/16/2019 02:35   CT Cervical Spine Wo Contrast  Result Date: 12/16/2019 CLINICAL DATA:  Fall EXAM: CT HEAD WITHOUT CONTRAST CT CERVICAL SPINE WITHOUT CONTRAST TECHNIQUE: Multidetector  CT imaging of the head and cervical spine was performed following the standard protocol without intravenous contrast. Multiplanar CT image reconstructions of the cervical spine were also generated. COMPARISON:  Head CT 07/04/2018 FINDINGS: CT HEAD FINDINGS Brain: Unchanged intraventricular mass near the left foramen of Monro measuring 16 mm, consistent with colloid cyst. No hemorrhage or extra-axial collection. No hydrocephalus or midline shift. There is generalized atrophy without lobar predilection. The brain parenchyma is normal, without evidence of acute or chronic infarction. Vascular: Atherosclerotic  calcification of the internal carotid arteries at the skull base. No abnormal hyperdensity of the major intracranial arteries or dural venous sinuses. Skull: The visualized skull base, calvarium and extracranial soft tissues are normal. Sinuses/Orbits: No fluid levels or advanced mucosal thickening of the visualized paranasal sinuses. No mastoid or middle ear effusion. The orbits are normal. CT CERVICAL SPINE FINDINGS Alignment: Grade 1 anterolisthesis at C4-5 Skull base and vertebrae: No acute fracture. Soft tissues and spinal canal: No prevertebral fluid or swelling. No visible canal hematoma. Disc levels: No spinal canal stenosis. Disc space narrowing is worst at C5-6. Multilevel facet hypertrophy. Upper chest: No pneumothorax, pulmonary nodule or pleural effusion. Other: Normal visualized paraspinal cervical soft tissues. IMPRESSION: 1. Unchanged colloid cyst at the left foramen of Monro without hydrocephalus or other acute intracranial abnormality. 2. No acute fracture of the cervical spine. Electronically Signed   By: Ulyses Jarred M.D.   On: 12/16/2019 02:35    ____________________________________________   PROCEDURES  Procedure(s) performed (including Critical Care):  Procedures   ____________________________________________   INITIAL IMPRESSION / ASSESSMENT AND PLAN / ED COURSE  As part of my medical decision making, I reviewed the following data within the [facility] notes reviewed and incorporated, Radiograph reviewed and Notes from prior ED visits     Henry Mays was evaluated in Emergency Department on 12/16/2019 for the symptoms described in the history of present illness. He was evaluated in the context of the global COVID-19 pandemic, which necessitated consideration that the patient might be at risk for infection with the SARS-CoV-2 virus that causes COVID-19. Institutional protocols and algorithms that pertain to the evaluation of patients at risk for  COVID-19 are in a state of rapid change based on information released by regulatory bodies including the CDC and federal and state organizations. These policies and algorithms were followed during the patient's care in the ED.    79 year old male who presents status post mechanical fall with multiple skin tears and right forehead hematoma.  Differential diagnosis includes but is not limited to [place], SDH, SAH, cervical spine injury, musculoskeletal injury, etc.  Will obtain CT head/cervical spine.  Patient declines offer for analgesia.  Will reassess.   Clinical Course as of Dec 15 525  Wed Dec 16, 2019  0254 Patient resting in no acute distress.  Updated him on CT scan results.  Strict return precautions given.  Patient verbalizes understanding and agrees with plan of care.   [JS]    Clinical Course User Index [JS] Paulette Blanch, MD     ____________________________________________   FINAL CLINICAL IMPRESSION(S) / ED DIAGNOSES  Final diagnoses:  Fall, initial encounter  Multiple skin tears  Injury of head, initial encounter     ED Discharge Orders    None       Note:  This document was prepared using Dragon voice recognition software and may include unintentional dictation errors.   Paulette Blanch, MD 12/16/19 [phone]

## 2019-12-16 NOTE — ED Notes (Signed)
Pt to CT

## 2019-12-16 NOTE — Discharge Instructions (Addendum)
1.  Keep wounds clean and dry. 2.  Return to the ER for worsening symptoms, persistent vomiting, lethargy, difficulty breathing or other concerns.

## 2019-12-16 NOTE — ED Notes (Signed)
Pt states he has no family or friends that can transport him back to [place] and will arrange EMS transport.

## 2019-12-16 NOTE — ED Notes (Signed)
Report called to [facility] RN at [place], they will help with dressing changes daily. Staff states they are not able to pick him up till 0800.

## 2019-12-16 NOTE — ED Triage Notes (Addendum)
Pt brought in by EMS for fall, was going to bathroom and lost balance. Hematoma to right forehead, denies any loc. Pt denies any pain at this time. Skin tears also noted to right hand

## 2019-12-16 NOTE — ED Notes (Signed)
CALLED EMS FOR [place].

## 2020-02-18 ENCOUNTER — Telehealth: Payer: Self-pay | Admitting: Family Medicine

## 2020-02-18 NOTE — Telephone Encounter (Signed)
Called to schedule Prolia injection ON or after 03/24/20 not before. OK to schedule Nurse visit or OV for Prolia.

## 2020-02-19 NOTE — Telephone Encounter (Signed)
Pt called and scheduled 03/30/20 @ 10am for prolia.

## 2020-03-16 DIAGNOSIS — Q046 Congenital cerebral cysts: Secondary | ICD-10-CM | POA: Diagnosis not present

## 2020-03-30 ENCOUNTER — Ambulatory Visit (INDEPENDENT_AMBULATORY_CARE_PROVIDER_SITE_OTHER): Payer: Medicare Other | Admitting: *Deleted

## 2020-03-30 ENCOUNTER — Other Ambulatory Visit: Payer: Self-pay

## 2020-03-30 DIAGNOSIS — M810 Age-related osteoporosis without current pathological fracture: Secondary | ICD-10-CM | POA: Diagnosis not present

## 2020-03-30 MED ORDER — DENOSUMAB 60 MG/ML ~~LOC~~ SOSY
60.0000 mg | PREFILLED_SYRINGE | Freq: Once | SUBCUTANEOUS | Status: AC
  Administered 2020-03-30: 60 mg via SUBCUTANEOUS

## 2020-03-30 NOTE — Progress Notes (Signed)
Patient presented for Prolia injection to Left arm [place], patient voiced no concerns or complaints during or after injection. 

## 2020-04-06 DIAGNOSIS — Z85820 Personal history of malignant melanoma of skin: Secondary | ICD-10-CM | POA: Diagnosis not present

## 2020-04-06 DIAGNOSIS — X32XXXA Exposure to sunlight, initial encounter: Secondary | ICD-10-CM | POA: Diagnosis not present

## 2020-04-06 DIAGNOSIS — D0439 Carcinoma in situ of skin of other parts of face: Secondary | ICD-10-CM | POA: Diagnosis not present

## 2020-04-06 DIAGNOSIS — L570 Actinic keratosis: Secondary | ICD-10-CM | POA: Diagnosis not present

## 2020-04-06 DIAGNOSIS — L821 Other seborrheic keratosis: Secondary | ICD-10-CM | POA: Diagnosis not present

## 2020-04-06 DIAGNOSIS — Z08 Encounter for follow-up examination after completed treatment for malignant neoplasm: Secondary | ICD-10-CM | POA: Diagnosis not present

## 2020-04-06 DIAGNOSIS — D485 Neoplasm of uncertain behavior of skin: Secondary | ICD-10-CM | POA: Diagnosis not present

## 2020-04-06 DIAGNOSIS — D044 Carcinoma in situ of skin of scalp and neck: Secondary | ICD-10-CM | POA: Diagnosis not present

## 2020-04-06 DIAGNOSIS — D692 Other nonthrombocytopenic purpura: Secondary | ICD-10-CM | POA: Diagnosis not present

## 2020-04-13 ENCOUNTER — Inpatient Hospital Stay
Admission: EM | Admit: 2020-04-13 | Discharge: 2020-05-10 | DRG: 091 | Disposition: E | Payer: Medicare Other | Attending: Internal Medicine | Admitting: Internal Medicine

## 2020-04-13 ENCOUNTER — Telehealth (INDEPENDENT_AMBULATORY_CARE_PROVIDER_SITE_OTHER): Payer: Medicare Other | Admitting: Family Medicine

## 2020-04-13 ENCOUNTER — Encounter: Payer: Self-pay | Admitting: Family Medicine

## 2020-04-13 ENCOUNTER — Observation Stay: Payer: Medicare Other

## 2020-04-13 ENCOUNTER — Emergency Department: Payer: Medicare Other

## 2020-04-13 ENCOUNTER — Encounter: Payer: Self-pay | Admitting: Emergency Medicine

## 2020-04-13 ENCOUNTER — Other Ambulatory Visit: Payer: Self-pay

## 2020-04-13 DIAGNOSIS — I952 Hypotension due to drugs: Secondary | ICD-10-CM | POA: Diagnosis not present

## 2020-04-13 DIAGNOSIS — Z87891 Personal history of nicotine dependence: Secondary | ICD-10-CM

## 2020-04-13 DIAGNOSIS — E871 Hypo-osmolality and hyponatremia: Secondary | ICD-10-CM | POA: Diagnosis not present

## 2020-04-13 DIAGNOSIS — Z20822 Contact with and (suspected) exposure to covid-19: Secondary | ICD-10-CM | POA: Diagnosis present

## 2020-04-13 DIAGNOSIS — Z681 Body mass index (BMI) 19 or less, adult: Secondary | ICD-10-CM

## 2020-04-13 DIAGNOSIS — G459 Transient cerebral ischemic attack, unspecified: Secondary | ICD-10-CM

## 2020-04-13 DIAGNOSIS — J9602 Acute respiratory failure with hypercapnia: Secondary | ICD-10-CM | POA: Diagnosis not present

## 2020-04-13 DIAGNOSIS — E878 Other disorders of electrolyte and fluid balance, not elsewhere classified: Secondary | ICD-10-CM | POA: Diagnosis present

## 2020-04-13 DIAGNOSIS — E876 Hypokalemia: Secondary | ICD-10-CM | POA: Diagnosis not present

## 2020-04-13 DIAGNOSIS — Z978 Presence of other specified devices: Secondary | ICD-10-CM

## 2020-04-13 DIAGNOSIS — D649 Anemia, unspecified: Secondary | ICD-10-CM | POA: Diagnosis present

## 2020-04-13 DIAGNOSIS — R569 Unspecified convulsions: Secondary | ICD-10-CM | POA: Diagnosis not present

## 2020-04-13 DIAGNOSIS — I6523 Occlusion and stenosis of bilateral carotid arteries: Secondary | ICD-10-CM | POA: Diagnosis present

## 2020-04-13 DIAGNOSIS — J9601 Acute respiratory failure with hypoxia: Secondary | ICD-10-CM | POA: Diagnosis not present

## 2020-04-13 DIAGNOSIS — R270 Ataxia, unspecified: Secondary | ICD-10-CM

## 2020-04-13 DIAGNOSIS — I10 Essential (primary) hypertension: Secondary | ICD-10-CM | POA: Diagnosis present

## 2020-04-13 DIAGNOSIS — E43 Unspecified severe protein-calorie malnutrition: Secondary | ICD-10-CM | POA: Diagnosis present

## 2020-04-13 DIAGNOSIS — R0902 Hypoxemia: Secondary | ICD-10-CM

## 2020-04-13 DIAGNOSIS — R059 Cough, unspecified: Secondary | ICD-10-CM

## 2020-04-13 DIAGNOSIS — R260 Ataxic gait: Principal | ICD-10-CM | POA: Diagnosis present

## 2020-04-13 DIAGNOSIS — R2689 Other abnormalities of gait and mobility: Secondary | ICD-10-CM | POA: Diagnosis not present

## 2020-04-13 DIAGNOSIS — R471 Dysarthria and anarthria: Secondary | ICD-10-CM | POA: Diagnosis not present

## 2020-04-13 DIAGNOSIS — J9611 Chronic respiratory failure with hypoxia: Secondary | ICD-10-CM

## 2020-04-13 DIAGNOSIS — Z66 Do not resuscitate: Secondary | ICD-10-CM | POA: Diagnosis not present

## 2020-04-13 DIAGNOSIS — Z79899 Other long term (current) drug therapy: Secondary | ICD-10-CM

## 2020-04-13 DIAGNOSIS — I160 Hypertensive urgency: Secondary | ICD-10-CM | POA: Diagnosis present

## 2020-04-13 DIAGNOSIS — J690 Pneumonitis due to inhalation of food and vomit: Secondary | ICD-10-CM | POA: Diagnosis not present

## 2020-04-13 DIAGNOSIS — Z8249 Family history of ischemic heart disease and other diseases of the circulatory system: Secondary | ICD-10-CM

## 2020-04-13 DIAGNOSIS — Z85828 Personal history of other malignant neoplasm of skin: Secondary | ICD-10-CM

## 2020-04-13 DIAGNOSIS — R05 Cough: Secondary | ICD-10-CM

## 2020-04-13 DIAGNOSIS — F10239 Alcohol dependence with withdrawal, unspecified: Secondary | ICD-10-CM | POA: Diagnosis present

## 2020-04-13 DIAGNOSIS — R262 Difficulty in walking, not elsewhere classified: Secondary | ICD-10-CM

## 2020-04-13 DIAGNOSIS — G9341 Metabolic encephalopathy: Secondary | ICD-10-CM | POA: Diagnosis not present

## 2020-04-13 DIAGNOSIS — R531 Weakness: Secondary | ICD-10-CM | POA: Diagnosis not present

## 2020-04-13 DIAGNOSIS — K7010 Alcoholic hepatitis without ascites: Secondary | ICD-10-CM | POA: Diagnosis present

## 2020-04-13 DIAGNOSIS — Z515 Encounter for palliative care: Secondary | ICD-10-CM | POA: Diagnosis not present

## 2020-04-13 DIAGNOSIS — Z9181 History of falling: Secondary | ICD-10-CM

## 2020-04-13 DIAGNOSIS — Z4659 Encounter for fitting and adjustment of other gastrointestinal appliance and device: Secondary | ICD-10-CM

## 2020-04-13 LAB — BASIC METABOLIC PANEL
Anion gap: 11 (ref 5–15)
BUN: 15 mg/dL (ref 8–23)
CO2: 25 mmol/L (ref 22–32)
Calcium: 8.2 mg/dL — ABNORMAL LOW (ref 8.9–10.3)
Chloride: 93 mmol/L — ABNORMAL LOW (ref 98–111)
Creatinine, Ser: 0.93 mg/dL (ref 0.61–1.24)
GFR calc Af Amer: >60 mL/min (ref >60)
GFR calc non Af Amer: >60 mL/min (ref >60)
Glucose, Bld: 108 mg/dL — ABNORMAL HIGH (ref 70–99)
Potassium: 3.5 mmol/L (ref 3.5–5.1)
Sodium: 129 mmol/L — ABNORMAL LOW (ref 135–145)

## 2020-04-13 LAB — RESPIRATORY PANEL BY RT PCR (FLU A&B, COVID)
Influenza A by PCR: NEGATIVE
Influenza B by PCR: NEGATIVE
SARS Coronavirus 2 by RT PCR: NEGATIVE

## 2020-04-13 LAB — URINALYSIS, COMPLETE (UACMP) WITH MICROSCOPIC
Bacteria, UA: NONE SEEN
Bilirubin Urine: NEGATIVE
Glucose, UA: NEGATIVE mg/dL
Ketones, ur: 5 mg/dL — AB
Leukocytes,Ua: NEGATIVE
Nitrite: NEGATIVE
Protein, ur: NEGATIVE mg/dL
Specific Gravity, Urine: 1.008 (ref 1.005–1.030)
pH: 6 (ref 5.0–8.0)

## 2020-04-13 LAB — URINE DRUG SCREEN, QUALITATIVE (ARMC ONLY)
Amphetamines, Ur Screen: NOT DETECTED
Barbiturates, Ur Screen: NOT DETECTED
Benzodiazepine, Ur Scrn: NOT DETECTED
Cannabinoid 50 Ng, Ur ~~LOC~~: NOT DETECTED
Cocaine Metabolite,Ur ~~LOC~~: NOT DETECTED
MDMA (Ecstasy)Ur Screen: NOT DETECTED
Methadone Scn, Ur: NOT DETECTED
Opiate, Ur Screen: NOT DETECTED
Phencyclidine (PCP) Ur S: NOT DETECTED
Tricyclic, Ur Screen: NOT DETECTED

## 2020-04-13 LAB — CBC
HCT: 38.1 % — ABNORMAL LOW (ref 39.0–52.0)
Hemoglobin: 12.9 g/dL — ABNORMAL LOW (ref 13.0–17.0)
MCH: 32.6 pg (ref 26.0–34.0)
MCHC: 33.9 g/dL (ref 30.0–36.0)
MCV: 96.2 fL (ref 80.0–100.0)
Platelets: 165 10*3/uL (ref 150–400)
RBC: 3.96 MIL/uL — ABNORMAL LOW (ref 4.22–5.81)
RDW: 14 % (ref 11.5–15.5)
WBC: 7.1 10*3/uL (ref 4.0–10.5)
nRBC: 0 % (ref 0.0–0.2)

## 2020-04-13 LAB — HEPATIC FUNCTION PANEL
ALT: 121 U/L — ABNORMAL HIGH (ref 0–44)
AST: 164 U/L — ABNORMAL HIGH (ref 15–41)
Albumin: 3.5 g/dL (ref 3.5–5.0)
Alkaline Phosphatase: 61 U/L (ref 38–126)
Bilirubin, Direct: 0.4 mg/dL — ABNORMAL HIGH (ref 0.0–0.2)
Indirect Bilirubin: 1.6 mg/dL — ABNORMAL HIGH (ref 0.3–0.9)
Total Bilirubin: 2 mg/dL — ABNORMAL HIGH (ref 0.3–1.2)
Total Protein: 8.3 g/dL — ABNORMAL HIGH (ref 6.5–8.1)

## 2020-04-13 LAB — PROTIME-INR
INR: 0.9 (ref 0.8–1.2)
Prothrombin Time: 11.7 seconds (ref 11.4–15.2)

## 2020-04-13 LAB — CK: Total CK: 61 U/L (ref 49–397)

## 2020-04-13 LAB — ETHANOL: Alcohol, Ethyl (B): <10 mg/dL (ref <10)

## 2020-04-13 LAB — AMMONIA: Ammonia: 10 umol/L (ref 9–35)

## 2020-04-13 MED ORDER — SODIUM CHLORIDE 0.9 % IV BOLUS
500.0000 mL | Freq: Once | INTRAVENOUS | Status: AC
  Administered 2020-04-13: 20:00:00 500 mL via INTRAVENOUS

## 2020-04-13 MED ORDER — DEXAMETHASONE 0.5 MG/5ML PO SOLN
0.2500 mg | Freq: Every day | ORAL | Status: DC
  Filled 2020-04-13: qty 2.5

## 2020-04-13 MED ORDER — ENOXAPARIN SODIUM 40 MG/0.4ML ~~LOC~~ SOLN
40.0000 mg | SUBCUTANEOUS | Status: DC
  Administered 2020-04-14 – 2020-04-21 (×8): 40 mg via SUBCUTANEOUS
  Filled 2020-04-13 (×9): qty 0.4

## 2020-04-13 MED ORDER — ACETAMINOPHEN 325 MG PO TABS: 650.0000 mg | ORAL_TABLET | ORAL | Status: DC | PRN

## 2020-04-13 MED ORDER — STROKE: EARLY STAGES OF RECOVERY BOOK: Freq: Once | Status: DC

## 2020-04-13 MED ORDER — SENNOSIDES-DOCUSATE SODIUM 8.6-50 MG PO TABS: 1.0000 | ORAL_TABLET | Freq: Every evening | ORAL | Status: DC | PRN

## 2020-04-13 MED ORDER — SODIUM CHLORIDE 0.9 % IV SOLN: INTRAVENOUS | Status: DC

## 2020-04-13 MED ORDER — DEXAMETHASONE 1 MG/ML PO CONC
0.2500 mg | Freq: Every day | ORAL | Status: DC
  Administered 2020-04-14 – 2020-04-18 (×5): 0.25 mg via ORAL
  Filled 2020-04-13 (×6): qty 0.25

## 2020-04-13 MED ORDER — LORAZEPAM 2 MG/ML IJ SOLN: 0.0000 mg | Freq: Two times a day (BID) | INTRAMUSCULAR | Status: DC

## 2020-04-13 MED ORDER — LORAZEPAM 2 MG/ML IJ SOLN: 0.0000 mg | Freq: Four times a day (QID) | INTRAMUSCULAR | Status: DC

## 2020-04-13 MED ORDER — ACETAMINOPHEN 650 MG RE SUPP: 650.0000 mg | RECTAL | Status: DC | PRN

## 2020-04-13 MED ORDER — THIAMINE HCL 100 MG PO TABS: 100.0000 mg | ORAL_TABLET | Freq: Every day | ORAL | Status: DC

## 2020-04-13 MED ORDER — THIAMINE HCL 100 MG/ML IJ SOLN: 100.0000 mg | Freq: Every day | INTRAMUSCULAR | Status: DC

## 2020-04-13 MED ORDER — LORAZEPAM 2 MG PO TABS: 0.0000 mg | ORAL_TABLET | Freq: Four times a day (QID) | ORAL | Status: DC

## 2020-04-13 MED ORDER — LORAZEPAM 2 MG PO TABS: 0.0000 mg | ORAL_TABLET | Freq: Two times a day (BID) | ORAL | Status: DC

## 2020-04-13 MED ORDER — ACETAMINOPHEN 160 MG/5ML PO SOLN
650.0000 mg | ORAL | Status: DC | PRN
  Filled 2020-04-13: qty 20.3

## 2020-04-13 NOTE — ED Notes (Signed)
Pt assisted to bedside toilet. Gait unsteady.

## 2020-04-13 NOTE — Progress Notes (Signed)
Tommi Rumps, MD Phone: [phone]  Henry Mays is a 79 y.o. male who presents today for same day visit.   Balance difficulty: Patient notes sudden worsening of this 2 days ago.  He was doing well getting around without a walker though suddenly 2 days ago he felt as though his balance was off.  He has not been able to walk without a walker.  He had a fall several weeks ago though none recently.  No significant injury with this.  He did not hit his head.  He has had no vertigo or lightheadedness.  He notes no focal weakness though his legs are quite weak per report.  No numbness.  No headaches.  He has very little appetite.  He has to hang onto something to walk around.  He had diarrhea starting today.  He is fully vaccinated against Covid.  No cough or congestion.  The visit did start out as a telephone visit though the patient was standing outside of our office as he could not pass the screening protocol to come in given his diarrhea.  About 10 minutes into the phone call he notes he felt so weak that he could not stand.  He had to be brought a wheelchair.  I went outside to evaluate him in person and gather additional history that is included above.    Social History   Tobacco Use  Smoking Status Former Smoker  Smokeless Tobacco Never Used     ROS see history of present illness  Objective  Physical Exam There were no vitals filed for this visit.  BP Readings from Last 3 Encounters:  04/14/20 (!) 161/67  12/16/19 138/86  08/28/19 120/78   Wt Readings from Last 3 Encounters:  04/21/2020 135 lb (61.2 kg)  04/09/2020 135 lb (61.2 kg)  12/16/19 133 lb (60.3 kg)    Physical Exam Constitutional:      Comments: Thin, frail  Neurological:     Mental Status: He is alert.     Comments: Neuro exam completed with the patient sitting down, 5/5 strength in bilateral biceps, triceps, grip, quads, hamstrings, plantar and dorsiflexion, sensation to light touch intact in bilateral UE  and LE, appears off balance and quite weak when he tried to get up out of the wheelchair, he had to be assisted in to his car      Assessment/Plan: Please see individual problem list.  Balance problem Sudden onset balance issues with weakness in his legs.  He has had a fairly sudden decline with lack of appetite and balance difficulty.  He does appear frail.  He has numerous bruises on his legs.  Given his significant weakness and inability to stand on his own I feel that the best evaluation would be completed in the emergency room with labs and imaging with potential admission for therapy and evaluation for possible rehab placement.  The patient is agreeable to go to the emergency room.  His friend will transport him there.  CMA contacted the charge nurse to let them know the patient was on his way.   10 minute of this visit was conducted over the phone and 15 minutes of the visit was in person.  No orders of the defined types were placed in this encounter.   No orders of the defined types were placed in this encounter.   This visit occurred during the SARS-CoV-2 public health emergency.  Safety protocols were in place, including screening questions prior to the visit, additional usage  of staff PPE, and extensive cleaning of exam room while observing appropriate contact time as indicated for disinfecting solutions.    Tommi Rumps, MD [place]

## 2020-04-13 NOTE — ED Notes (Signed)
Pt given phone to answer MRI screening questions with MRI tech.

## 2020-04-13 NOTE — ED Notes (Addendum)
Pt assisted with use of urinal. specimen collected and sent to lab. Pt tolerated well.

## 2020-04-13 NOTE — H&P (Addendum)
[facility] NAME: Henry Mays    MR#:  [id number]  DATE OF BIRTH:  [birth year]  DATE OF ADMISSION:  05/03/2020  PRIMARY CARE PHYSICIAN: Leone Haven, MD   REQUESTING/REFERRING PHYSICIAN: Marjean Donna, MD CHIEF COMPLAINT:   Chief Complaint  Patient presents with  . Weakness    HISTORY OF PRESENT ILLNESS:  Henry Mays  is a 79 y.o. Caucasian male with a known history of hypertension, osteoarthritis, alcohol abuse and C. difficile colitis, who presented to the emergency room with acute onset of ataxia and inability to ambulate.  He had a fall in January and a couple weeks ago.  Today he had to use his walker without he was feeling significantly weak in both legs and unable to ambulate.  He drinks a couple of beer per night not on a regular basis.  He lives by himself.  He denies any paresthesias or other focal muscle weakness.  No nausea or vomiting or diarrhea or abdominal pain.  No headache or dizziness or blurred vision.  Patient was noted to have intention tremors in the ER and when he stood up to walk he was unsteady and needed 2 person assist with a shuffling like gait.  Upon presentation to the emergency room, temperature was 99.4 and heart rate 103 with otherwise normal vital signs.  Blood pressure later on was 175/97 and later improved.  Labs revealed hyponatremia 129 with hypochloremia 93 and borderline potassium of 3.5.  AST was 164 and ALT 121 with total protein of 8.3 with albumin of 3.5.  Indirect bili was 1.6 and total bili 2 with direct bili of 0.4 and ammonia level 10.  CBC showed mild anemia.  COVID-19 PCR and influenza antigens came back negative.  Urine drug screen came back negative.  Alcohol levels less than 10.  Noncontrasted head CT scan revealed no acute intracranial abnormalities.  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 EKG showed normal  sinus rhythm with a rate of 94 with slightly poor R wave progression.  The patient was given 500 mL IV normal saline bolus as well as IV thiamine and placed on CIWA protocol.  The patient will be admitted to an observation medical monitored bed for further evaluation and management. PAST MEDICAL HISTORY:   Past Medical History:  Diagnosis Date  . Arthritis   . Basal cell carcinoma   . Chickenpox   . History of Clostridium difficile infection   . Hypertension   . MGUS (monoclonal gammopathy of unknown significance)   . Near syncope     PAST SURGICAL HISTORY:   Past Surgical History:  Procedure Laterality Date  . BASAL CELL CARCINOMA EXCISION    . HERNIA REPAIR      SOCIAL HISTORY:   Social History   Tobacco Use  . Smoking status: Former Research scientist (life sciences)  . Smokeless tobacco: Never Used  Substance Use Topics  . Alcohol use: Not Currently    Comment: Not drinking any alcohol since c diff diagnosis    FAMILY HISTORY:   Family History  Problem Relation Age of Onset  . Arthritis Other   . Stroke Other   . Hypertension Other   . Leukemia Father   . CAD Mother   . Prostate cancer Neg Hx   . Bladder Cancer Neg Hx   . Kidney cancer Neg Hx     DRUG ALLERGIES:  No Known Allergies  REVIEW OF SYSTEMS:   ROS As per history of present illness. All pertinent systems were reviewed above. Constitutional,  HEENT, cardiovascular, respiratory, GI, GU, musculoskeletal, neuro, psychiatric, endocrine,  integumentary and hematologic systems were reviewed and are otherwise  negative/unremarkable except for positive findings mentioned above in the HPI.   MEDICATIONS AT HOME:   Prior to Admission medications   Medication Sig Start Date End Date Taking? Authorizing Provider  dexamethasone 0.5 MG/5ML elixir TAKE 2 TEASPONFULS BY MOUTH 3 TIMES DAILY Patient taking differently: Take 0.25 mg by mouth daily.  12/10/19  Yes Leone Haven, MD  chlorproMAZINE (THORAZINE) 10 MG tablet Take 1  tablet (10 mg total) by mouth 3 (three) times daily as needed for hiccoughs. Patient not taking: Reported on 04/19/2020 09/25/19   Leone Haven, MD      VITAL SIGNS:  Blood pressure (!) 161/83, pulse 98, temperature 99.4 F (37.4 C), temperature source Oral, resp. rate 19, height 5\' 8"  (1.727 m), weight 61.2 kg, SpO2 99 %.  PHYSICAL EXAMINATION:  Physical Exam  GENERAL:  79 y.o.-year-old Caucasian male patient lying in the bed with no acute distress.  EYES: Pupils equal, round, reactive to light and accommodation. No scleral icterus. Extraocular muscles intact.  HEENT: Head atraumatic, normocephalic. Oropharynx and nasopharynx clear.  NECK:  Supple, no jugular venous distention. No thyroid enlargement, no tenderness.  LUNGS: Normal breath sounds bilaterally, no wheezing, rales,rhonchi or crepitation. No use of accessory muscles of respiration.  CARDIOVASCULAR: Regular rate and rhythm, S1, S2 normal. No murmurs, rubs, or gallops.  ABDOMEN: Soft, nondistended, nontender. Bowel sounds present. No organomegaly or mass.  EXTREMITIES: No pedal edema, cyanosis, or clubbing.  Is also noted to have resting NEUROLOGIC: Cranial nerves II through XII are intact. Muscle strength 5/5 in all extremities. Sensation intact. Gait was checked earlier and the patient was unsteady with shuffling gait.  He had a positive Romberg sign.  He was also having positive intention tremors bilaterally. PSYCHIATRIC: The patient is alert and oriented x 3.  Normal affect and good eye contact. SKIN: Bilateral anterior leg and knee abrasions with scabs. LABORATORY PANEL:   CBC Recent Labs  Lab 05/08/2020 1641  WBC 7.1  HGB 12.9*  HCT 38.1*  PLT 165   ------------------------------------------------------------------------------------------------------------------  Chemistries  Recent Labs  Lab 04/27/2020 1641  NA 129*  K 3.5  CL 93*  CO2 25  GLUCOSE 108*  BUN 15  CREATININE 0.93  CALCIUM 8.2*  AST 164*    ALT 121*  ALKPHOS 61  BILITOT 2.0*   ------------------------------------------------------------------------------------------------------------------  Cardiac Enzymes No results for input(s): TROPONINI in the last 168 hours. ------------------------------------------------------------------------------------------------------------------  RADIOLOGY:  CT Head Wo Contrast  Result Date: 04/12/2020 CLINICAL DATA:  Balance difficulty EXAM: CT HEAD WITHOUT CONTRAST TECHNIQUE: Contiguous axial images were obtained from the base of the skull through the vertex without intravenous contrast. COMPARISON:  CT 12/16/2019, MRI 5 /10/2019 FINDINGS: Brain: No acute territorial infarction or hemorrhage is visualized. 16 mm slightly dense mass at the left foramen of Monro, consistent with colloid cysts, without significant change. Moderate atrophy. Stable ventricular prominence. Patchy white matter hypodensity consistent with chronic small vessel ischemic change. Vascular: No hyperdense vessel.  Carotid vascular calcification Skull: Normal. Negative for fracture or focal lesion. Sinuses/Orbits: Mucosal thickening in the left maxillary sinus Other: None IMPRESSION: 1. No CT evidence for acute intracranial abnormality 2. 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 3. Atrophy and chronic small vessel ischemic change of  the white matter Electronically Signed   By: Donavan Foil M.D.   On: 04/21/2020 20:49      IMPRESSION AND PLAN:   1.  Ataxia with unsteady gait and recent recurrent falls and inability to ambulate.  Will need to rule out cerebellar CVA/TIA, other differential diagnoses include parkinsonism and alcohol related ataxia. -The patient will be admitted to an observation medical monitored bed. -We will follow neuro checks every 4 hours for 24 hours. -We will obtain a brain MRI without contrast as well as bilateral carotid Doppler and 2D echo. -Neurology consultation will be obtained. -I  notified Dr. Irish Elders about the patient. -PT/OT consult will be obtained. -The patient will be placed on aspirin. -We will check fasting lipids in a.m.  2.  Alcohol abuse with elevated LFTs, likely related to alcoholic liver disease. -We will obtain viral hepatitis markers. -We will hydrate the patient and follow LFTs. -We will place the patient on as needed IV Ativan as well as scheduled folate, thiamine and multivitamins.  3.  Mild hyponatremia and hypochloremia. -The patient will be hydrated with IV normal saline for now and will follow BMP. -Hyponatremia work-up will be obtained obtained.  4.  Hypertensive urgency. -The patient will be placed on as needed IV labetalol. -Permissive hypertension will be followed while assessing for TIA/CVA.  5.  DVT prophylaxis. -Subcutaneous Lovenox.    All the records are reviewed and case discussed with ED provider. The plan of care was discussed in details with the patient (and family). I answered all questions. The patient agreed to proceed with the above mentioned plan. Further management will depend upon hospital course.   CODE STATUS: Full code  Status is: Observation  The patient remains OBS appropriate and will d/c before 2 midnights.  Dispo: The patient is from: Home              Anticipated d/c is to: Home              Anticipated d/c date is: 1 day              Patient currently is not medically stable to d/c.      TOTAL TIME TAKING CARE OF THIS PATIENT: 55 minutes.    Christel Mormon M.D on 04/30/2020 at 10:27 PM  [facility]   From 7 PM-7 AM, contact night-coverage [email or web address]  CC: Primary care physician; Leone Haven, MD   Note: This dictation was prepared with Dragon dictation along with smaller phrase technology. Any transcriptional errors that result from this process are unintentional.

## 2020-04-13 NOTE — ED Triage Notes (Addendum)
Pt coming from home via POV. Pt st, having trouble with his  "balance" for over a week. Pt st he walks independently but had to use a walker due to increase leg weakness. Pt went to his PCP and while standing on his walker he felt dizzy today and sent here for an evaluation.   Pt st falling over a week ago, "trip over something and fell". Denies LOC or hitting his head. Per pt, evaluated by EMS and not taken to hospital for evaluatio.    Pt denies urinary symptoms. Denies recent changes on medication.  Denies Cp/SHOB.

## 2020-04-13 NOTE — ED Provider Notes (Signed)
[facility] Emergency Department Provider Note  ____________________________________________   First MD Initiated Contact with Patient 04/30/2020 1849     (approximate)  I have reviewed the triage vital signs and the nursing notes.   HISTORY  Chief Complaint Weakness    HPI Henry Mays is a 79 y.o. male with hypertension who comes in with difficulties with balance.  Patient states that he had a fall in January at nighttime and states that he uses a walker at nighttime only just to prevent falls.  However the past 1 week he states that his legs feel really shaky and that he is having difficulties with standing up and with ambulating to the point where he is needed to use a walker for the past 1 week.  Symptoms have been severe, constant, nothing makes it better, nothing makes them worse.  He denies any falls in the past week due to using the walker but he states that he feels really off balance.  Initially he was able to get around without a walker but then 2 days ago he felt like his balance.  He denies any weakness on one side, numbness, chest pain, shortness of breath, abdominal pain, dysuria.  Patient seen by Dr. Caryl Bis for labs, imaging and potential admission for therapy and evaluation for possible rehab placement.       Past Medical History:  Diagnosis Date  . Arthritis   . Basal cell carcinoma   . Chickenpox   . History of Clostridium difficile infection   . Hypertension   . MGUS (monoclonal gammopathy of unknown significance)   . Near syncope     Patient Active Problem List   Diagnosis Date Noted  . Balance problem 04/12/2020  . Cerumen impaction 08/28/2019  . Personal history of fall 08/28/2019  . Skin cancer 08/03/2019  . Elevated PSA 07/04/2018  . Mouth sores 07/04/2018  . Dental anomaly 04/01/2018  . Plantar wart of right foot 05/14/2017  . PVC (premature ventricular contraction) 08/28/2016  . Intractable hiccups 08/07/2016    . 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 Osteoporosis 05/22/2016  . Decreased right shoulder range of motion 04/10/2016  . MGUS (monoclonal gammopathy of unknown significance) 04/10/2016    Past Surgical History:  Procedure Laterality Date  . BASAL CELL CARCINOMA EXCISION    . HERNIA REPAIR      Prior to Admission medications   Medication Sig Start Date End Date Taking? Authorizing Provider  chlorproMAZINE (THORAZINE) 10 MG tablet Take 1 tablet (10 mg total) by mouth 3 (three) times daily as needed for hiccoughs. 09/25/19   Leone Haven, MD  dexamethasone 0.5 MG/5ML elixir TAKE 2 TEASPONFULS BY MOUTH 3 TIMES DAILY 12/10/19   Leone Haven, MD  tetracaine POWD  04/09/19   [provider]    Allergies Patient has no known allergies.  Family History  Problem Relation Age of Onset  . Arthritis Other   . Stroke Other   . Hypertension Other   . Leukemia Father   . CAD Mother   . Prostate cancer Neg Hx   . Bladder Cancer Neg Hx   . Kidney cancer Neg Hx     Social History Social History   Tobacco Use  . Smoking status: Former Research scientist (life sciences)  . Smokeless tobacco: Never Used  Substance Use Topics  . Alcohol use: Not Currently    Comment: Not drinking any alcohol since c diff diagnosis  . Drug use: No  Review of Systems Constitutional: No fever/chills Eyes: No visual changes. ENT: No sore throat. Cardiovascular: Denies chest pain. Respiratory: Denies shortness of breath. Gastrointestinal: No abdominal pain.  No nausea, no vomiting.  No diarrhea.  No constipation. Genitourinary: Negative for dysuria. Musculoskeletal: Negative for back pain. Skin: Negative for rash. Neurological: Difficulties with walking, off balance All other ROS negative ____________________________________________   PHYSICAL EXAM:  VITAL SIGNS: ED Triage Vitals  Enc Vitals Group     BP 04/11/2020 1609 119/72     Pulse Rate 04/10/2020 1609 (!) 103     Resp 05/08/2020 1609 20      Temp 04/20/2020 1609 99.4 F (37.4 C)     Temp Source 04/30/2020 1609 Oral     SpO2 04/21/2020 1609 99 %     Weight 05/07/2020 1609 135 lb (61.2 kg)     Height 04/14/2020 1609 5\' 8"  (1.727 m)     Head Circumference --      Peak Flow --      Pain Score 05/06/2020 1625 0     Pain Loc --      Pain Edu? --      Excl. in [place]? --     Constitutional: Alert and oriented. Well appearing and in no acute distress. Eyes: Conjunctivae are normal. EOMI. Head: Atraumatic. Nose: No congestion/rhinnorhea. Mouth/Throat: Mucous membranes are moist.   Neck: No stridor. Trachea Midline. FROM Cardiovascular: Normal rate, regular rhythm. Grossly normal heart sounds.  Good peripheral circulation. Respiratory: Normal respiratory effort.  No retractions. Lungs CTAB. Gastrointestinal: Soft and nontender. No distention. No abdominal bruits.  Musculoskeletal: No lower extremity tenderness nor edema.  No joint effusions. Neurologic: Cranial nerves are intact with equal strength in arms and legs and equal sensation.  However his finger-to-nose he does have some discoordination worse on the right over the left.  He is got significant intention tremors bilaterally.  He is able to stand up but has a shuffling gait with trying to walk with 2 person assist.  Positive Romberg sign Skin:  Skin is warm, dry and intact. No rash noted. Psychiatric: Mood and affect are normal. Speech and behavior are normal. GU: Deferred   ____________________________________________   LABS (all labs ordered are listed, but only abnormal results are displayed)  Labs Reviewed  BASIC METABOLIC PANEL - Abnormal; Notable for the following components:      Result Value   Sodium 129 (*)    Chloride 93 (*)    Glucose, Bld 108 (*)    Calcium 8.2 (*)    All other components within normal limits  CBC - Abnormal; Notable for the following components:   RBC 3.96 (*)    Hemoglobin 12.9 (*)    HCT 38.1 (*)    All other components within normal limits   RESPIRATORY PANEL BY RT PCR (FLU A&B, COVID)  URINALYSIS, COMPLETE (UACMP) WITH MICROSCOPIC  HEPATIC FUNCTION PANEL  CK  CBG MONITORING, ED   ____________________________________________   ED ECG REPORT I, Vanessa Kilauea, the attending physician, personally viewed and interpreted this ECG.  Sinus rate of 94, no ST elevation, no T wave inversions, normal intervals ____________________________________________  RADIOLOGY    Official radiology report(s): CT Head Wo Contrast  Result Date: 04/18/2020 CLINICAL DATA:  Balance difficulty EXAM: CT HEAD WITHOUT CONTRAST TECHNIQUE: Contiguous axial images were obtained from the base of the skull through the vertex without intravenous contrast. COMPARISON:  CT 12/16/2019, MRI 5 /10/2019 FINDINGS: Brain: No acute territorial infarction or hemorrhage is visualized.  16 mm slightly dense mass at the left foramen of Monro, consistent with colloid cysts, without significant change. Moderate atrophy. Stable ventricular prominence. Patchy white matter hypodensity consistent with chronic small vessel ischemic change. Vascular: No hyperdense vessel.  Carotid vascular calcification Skull: Normal. Negative for fracture or focal lesion. Sinuses/Orbits: Mucosal thickening in the left maxillary sinus Other: None IMPRESSION: 1. No CT evidence for acute intracranial abnormality 2. 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 3. Atrophy and chronic small vessel ischemic change of the white matter Electronically Signed   By: Donavan Foil M.D.   On: 05/07/2020 20:49    ____________________________________________   PROCEDURES  Procedure(s) performed (including Critical Care):  Procedures   ____________________________________________   INITIAL IMPRESSION / ASSESSMENT AND PLAN / ED COURSE  Henry Mays was evaluated in Emergency Department on 04/09/2020 for the symptoms described in the history of present illness. He was evaluated in the context of the  global COVID-19 pandemic, which necessitated consideration that the patient might be at risk for infection with the SARS-CoV-2 virus that causes COVID-19. Institutional protocols and algorithms that pertain to the evaluation of patients at risk for COVID-19 are in a state of rapid change based on information released by regulatory bodies including the CDC and federal and state organizations. These policies and algorithms were followed during the patient's care in the ED.    Patient is a 79 year old with new difficulties with ambulation acutely over the past 1 week.  Patient does report being able to walk a mile prior to this.  Will get CT head to evaluate for intracranial hemorrhage, tumor, hydrocephalus.  Patient was can be Parkinson's or other neurological process but it is pretty acute in onset.  Patient will most likely benefit from MRI to evaluate for stroke given his ataxia.  Will get labs to evaluate for Electra abnormalities, AKI.  At this point do not feel safe with patient going home especially since he lives by himself.  He will most likely require admission for MRI and further work up  Patient sodium is low at 129 Hemoglobin is stable  Patient's LFTs are slightly elevated he does report drinking daily a few beers.  Will add on ammonia level and INR.  On repeat evaluation he does not have any abdominal pain to suggest needing imaging.  Patient denies withdrawing in the past.  States that he is gone for days without any symptoms of withdrawal.  States he only drinks a few beers daily.  Will place patient on CIWA protocol in his case this could be contributing.  We will discussed with the hospital team for admission given he most likely needs an MRI to rule out stroke given his ataxia as well as further work-up and PT and OT evaluation.  This time he not develop patient is safe to go home      ____________________________________________   FINAL CLINICAL IMPRESSION(S) / ED  DIAGNOSES   Final diagnoses:  Hyponatremia  Difficulty walking      MEDICATIONS GIVEN DURING THIS VISIT:  Medications  LORazepam (ATIVAN) injection 0-4 mg (has no administration in time range)    Or  LORazepam (ATIVAN) tablet 0-4 mg (has no administration in time range)  LORazepam (ATIVAN) injection 0-4 mg (has no administration in time range)    Or  LORazepam (ATIVAN) tablet 0-4 mg (has no administration in time range)  thiamine tablet 100 mg (has no administration in time range)    Or  thiamine (B-1) injection 100 mg (  has no administration in time range)  sodium chloride 0.9 % bolus 500 mL (0 mLs Intravenous Stopped 04/24/2020 2135)     ED Discharge Orders    None       Note:  This document was prepared using Dragon voice recognition software and may include unintentional dictation errors.   Vanessa [place], MD 05/07/2020 2242

## 2020-04-13 NOTE — ED Notes (Signed)
Pt assisted back to stretcher. Pt gait unsteady. Provided for comfort and safety and will continue to assess.

## 2020-04-13 NOTE — ED Notes (Addendum)
Dr. Jari Pigg at the bedside for pt evaluation. Assisted pt to standing and ambulated short distance with two person assist. Pt steady with shuffling gait. Continues to complain of weakness and feeling like he is going to fall which worsens when eyes are closed. Assisted back to stretcher, changed into hospital gown and placed on cardiac monitor.  Provided for comfort and safety and will continue to assess.

## 2020-04-13 NOTE — ED Notes (Signed)
Pt to CT

## 2020-04-13 NOTE — ED Notes (Signed)
Call [facility] for a ride home or [facility] [phone]

## 2020-04-13 NOTE — Assessment & Plan Note (Signed)
Sudden onset balance issues with weakness in his legs.  He has had a fairly sudden decline with lack of appetite and balance difficulty.  He does appear frail.  He has numerous bruises on his legs.  Given his significant weakness and inability to stand on his own I feel that the best evaluation would be completed in the emergency room with labs and imaging with potential admission for therapy and evaluation for possible rehab placement.  The patient is agreeable to go to the emergency room.  His friend will transport him there.  CMA contacted the charge nurse to let them know the patient was on his way.

## 2020-04-14 ENCOUNTER — Observation Stay
Admit: 2020-04-14 | Discharge: 2020-04-14 | Disposition: A | Discharge status: Home / Self Care | Payer: Medicare Other | Attending: Family Medicine | Admitting: Family Medicine

## 2020-04-14 ENCOUNTER — Observation Stay: Payer: Medicare Other

## 2020-04-14 DIAGNOSIS — I160 Hypertensive urgency: Secondary | ICD-10-CM | POA: Diagnosis present

## 2020-04-14 DIAGNOSIS — J9602 Acute respiratory failure with hypercapnia: Secondary | ICD-10-CM | POA: Diagnosis not present

## 2020-04-14 DIAGNOSIS — R42 Dizziness and giddiness: Secondary | ICD-10-CM | POA: Diagnosis not present

## 2020-04-14 DIAGNOSIS — R270 Ataxia, unspecified: Secondary | ICD-10-CM | POA: Diagnosis not present

## 2020-04-14 DIAGNOSIS — Z681 Body mass index (BMI) 19 or less, adult: Secondary | ICD-10-CM | POA: Diagnosis not present

## 2020-04-14 DIAGNOSIS — R0902 Hypoxemia: Secondary | ICD-10-CM | POA: Diagnosis not present

## 2020-04-14 DIAGNOSIS — G459 Transient cerebral ischemic attack, unspecified: Secondary | ICD-10-CM | POA: Diagnosis not present

## 2020-04-14 DIAGNOSIS — J690 Pneumonitis due to inhalation of food and vomit: Secondary | ICD-10-CM | POA: Diagnosis not present

## 2020-04-14 DIAGNOSIS — J449 Chronic obstructive pulmonary disease, unspecified: Secondary | ICD-10-CM | POA: Diagnosis not present

## 2020-04-14 DIAGNOSIS — Z79899 Other long term (current) drug therapy: Secondary | ICD-10-CM | POA: Diagnosis not present

## 2020-04-14 DIAGNOSIS — E43 Unspecified severe protein-calorie malnutrition: Secondary | ICD-10-CM | POA: Diagnosis not present

## 2020-04-14 DIAGNOSIS — R260 Ataxic gait: Secondary | ICD-10-CM | POA: Diagnosis present

## 2020-04-14 DIAGNOSIS — Z66 Do not resuscitate: Secondary | ICD-10-CM | POA: Diagnosis not present

## 2020-04-14 DIAGNOSIS — E878 Other disorders of electrolyte and fluid balance, not elsewhere classified: Secondary | ICD-10-CM | POA: Diagnosis not present

## 2020-04-14 DIAGNOSIS — I6523 Occlusion and stenosis of bilateral carotid arteries: Secondary | ICD-10-CM | POA: Diagnosis not present

## 2020-04-14 DIAGNOSIS — E871 Hypo-osmolality and hyponatremia: Secondary | ICD-10-CM

## 2020-04-14 DIAGNOSIS — I10 Essential (primary) hypertension: Secondary | ICD-10-CM

## 2020-04-14 DIAGNOSIS — Z452 Encounter for adjustment and management of vascular access device: Secondary | ICD-10-CM | POA: Diagnosis not present

## 2020-04-14 DIAGNOSIS — D649 Anemia, unspecified: Secondary | ICD-10-CM | POA: Diagnosis present

## 2020-04-14 DIAGNOSIS — Z87891 Personal history of nicotine dependence: Secondary | ICD-10-CM | POA: Diagnosis not present

## 2020-04-14 DIAGNOSIS — Z9181 History of falling: Secondary | ICD-10-CM | POA: Diagnosis not present

## 2020-04-14 DIAGNOSIS — R296 Repeated falls: Secondary | ICD-10-CM

## 2020-04-14 DIAGNOSIS — R262 Difficulty in walking, not elsewhere classified: Secondary | ICD-10-CM | POA: Diagnosis present

## 2020-04-14 DIAGNOSIS — Z515 Encounter for palliative care: Secondary | ICD-10-CM | POA: Diagnosis not present

## 2020-04-14 DIAGNOSIS — J9601 Acute respiratory failure with hypoxia: Secondary | ICD-10-CM | POA: Diagnosis not present

## 2020-04-14 DIAGNOSIS — Z85828 Personal history of other malignant neoplasm of skin: Secondary | ICD-10-CM | POA: Diagnosis not present

## 2020-04-14 DIAGNOSIS — R569 Unspecified convulsions: Secondary | ICD-10-CM | POA: Diagnosis not present

## 2020-04-14 DIAGNOSIS — R471 Dysarthria and anarthria: Secondary | ICD-10-CM | POA: Diagnosis not present

## 2020-04-14 DIAGNOSIS — R05 Cough: Secondary | ICD-10-CM | POA: Diagnosis not present

## 2020-04-14 DIAGNOSIS — F10239 Alcohol dependence with withdrawal, unspecified: Secondary | ICD-10-CM | POA: Diagnosis present

## 2020-04-14 DIAGNOSIS — G9341 Metabolic encephalopathy: Secondary | ICD-10-CM | POA: Diagnosis not present

## 2020-04-14 DIAGNOSIS — Z20822 Contact with and (suspected) exposure to covid-19: Secondary | ICD-10-CM | POA: Diagnosis present

## 2020-04-14 DIAGNOSIS — Z4682 Encounter for fitting and adjustment of non-vascular catheter: Secondary | ICD-10-CM | POA: Diagnosis not present

## 2020-04-14 DIAGNOSIS — F1010 Alcohol abuse, uncomplicated: Secondary | ICD-10-CM | POA: Diagnosis not present

## 2020-04-14 DIAGNOSIS — K7010 Alcoholic hepatitis without ascites: Secondary | ICD-10-CM | POA: Diagnosis present

## 2020-04-14 DIAGNOSIS — Z8249 Family history of ischemic heart disease and other diseases of the circulatory system: Secondary | ICD-10-CM | POA: Diagnosis not present

## 2020-04-14 LAB — HEMOGLOBIN A1C
Hgb A1c MFr Bld: 5.6 % (ref 4.8–5.6)
Mean Plasma Glucose: 114.02 mg/dL

## 2020-04-14 LAB — LIPID PANEL
Cholesterol: 234 mg/dL — ABNORMAL HIGH (ref 0–200)
HDL: 128 mg/dL (ref >40)
LDL Cholesterol: 92 mg/dL (ref 0–99)
Total CHOL/HDL Ratio: 1.8 RATIO
Triglycerides: 72 mg/dL (ref <150)
VLDL: 14 mg/dL (ref 0–40)

## 2020-04-14 LAB — NA AND K (SODIUM & POTASSIUM), RAND UR
Potassium Urine: 21 mmol/L
Sodium, Ur: 86 mmol/L

## 2020-04-14 LAB — HEPATITIS PANEL, ACUTE
HCV Ab: NONREACTIVE
Hep A IgM: NONREACTIVE
Hep B C IgM: NONREACTIVE
Hepatitis B Surface Ag: NONREACTIVE

## 2020-04-14 LAB — ECHOCARDIOGRAM COMPLETE
Height: 68 in
Weight: 2160 oz

## 2020-04-14 LAB — OSMOLALITY, URINE: Osmolality, Ur: 454 mOsm/kg (ref 300–900)

## 2020-04-14 LAB — VITAMIN B12: Vitamin B-12: 303 pg/mL (ref 180–914)

## 2020-04-14 LAB — OSMOLALITY: Osmolality: 282 mOsm/kg (ref 275–295)

## 2020-04-14 MED ORDER — THIAMINE HCL 100 MG/ML IJ SOLN: 100.0000 mg | Freq: Every day | INTRAMUSCULAR | Status: DC

## 2020-04-14 MED ORDER — FOLIC ACID 1 MG PO TABS
1.0000 mg | ORAL_TABLET | Freq: Every day | ORAL | Status: DC
  Administered 2020-04-15 – 2020-04-18 (×4): 1 mg via ORAL
  Filled 2020-04-14 (×4): qty 1

## 2020-04-14 MED ORDER — THIAMINE HCL 100 MG PO TABS: 500.0000 mg | ORAL_TABLET | Freq: Every day | ORAL | Status: DC

## 2020-04-14 MED ORDER — LORAZEPAM 2 MG/ML IJ SOLN
1.0000 mg | INTRAMUSCULAR | Status: DC | PRN
  Administered 2020-04-18 (×2): 1 mg via INTRAVENOUS
  Filled 2020-04-14 (×3): qty 1

## 2020-04-14 MED ORDER — THIAMINE HCL 100 MG/ML IJ SOLN
100.0000 mg | Freq: Every day | INTRAMUSCULAR | Status: DC
  Filled 2020-04-14: qty 2

## 2020-04-14 MED ORDER — LABETALOL HCL 5 MG/ML IV SOLN
20.0000 mg | INTRAVENOUS | Status: DC | PRN
  Administered 2020-04-15 – 2020-04-18 (×2): 20 mg via INTRAVENOUS
  Filled 2020-04-14 (×2): qty 4

## 2020-04-14 MED ORDER — THIAMINE HCL 100 MG PO TABS
100.0000 mg | ORAL_TABLET | Freq: Every day | ORAL | Status: DC
  Administered 2020-04-14: 100 mg via ORAL
  Filled 2020-04-14: qty 1

## 2020-04-14 MED ORDER — THIAMINE HCL 100 MG/ML IJ SOLN
500.0000 mg | Freq: Every day | INTRAVENOUS | Status: AC
  Administered 2020-04-14 – 2020-04-15 (×2): 500 mg via INTRAVENOUS
  Filled 2020-04-14 (×2): qty 5

## 2020-04-14 MED ORDER — ADULT MULTIVITAMIN W/MINERALS CH
1.0000 | ORAL_TABLET | Freq: Every day | ORAL | Status: DC
  Administered 2020-04-14 – 2020-04-18 (×5): 1 via ORAL
  Filled 2020-04-14 (×5): qty 1

## 2020-04-14 MED ORDER — ASPIRIN EC 81 MG PO TBEC
81.0000 mg | DELAYED_RELEASE_TABLET | Freq: Every day | ORAL | Status: DC
  Administered 2020-04-14 – 2020-04-18 (×5): 81 mg via ORAL
  Filled 2020-04-14 (×5): qty 1

## 2020-04-14 MED ORDER — THIAMINE HCL 100 MG PO TABS: 100.0000 mg | ORAL_TABLET | Freq: Every day | ORAL | Status: DC

## 2020-04-14 MED ORDER — FOLIC ACID 1 MG PO TABS
1.0000 mg | ORAL_TABLET | Freq: Every day | ORAL | Status: DC
  Administered 2020-04-14: 1 mg via ORAL
  Filled 2020-04-14: qty 1

## 2020-04-14 NOTE — Consult Note (Signed)
Reason for Consult: ataxic gait  Requesting Physician: Dr. Benny Lennert  CC: Ataxic gait HPI: Henry Mays is an 79 y.o. male with a known history of hypertension, osteoarthritis, alcohol use and C. difficile colitis, who presented to the emergency room with acute onset of ataxia and inability to ambulate that has been progressing over last month to few weeks.  He had a fall in January and a couple weeks ago.  Today he had to use his walker due to significant weakness.  He drinks a couple of beer per night not on a regular basis.  He lives by himself.  He denies any paresthesias or other focal muscle weakness.  No nausea or vomiting or diarrhea or abdominal pain.  No headache or dizziness or blurred vision.  Patient was noted to have intention tremors in the ER and when he stood up to walk he was unsteady and needed 2 person assist with a shuffling like gait.   Past Medical History:  Diagnosis Date  . Arthritis   . Basal cell carcinoma   . Chickenpox   . History of Clostridium difficile infection   . Hypertension   . MGUS (monoclonal gammopathy of unknown significance)   . Near syncope     Past Surgical History:  Procedure Laterality Date  . BASAL CELL CARCINOMA EXCISION    . HERNIA REPAIR      Family History  Problem Relation Age of Onset  . Arthritis Other   . Stroke Other   . Hypertension Other   . Leukemia Father   . CAD Mother   . Prostate cancer Neg Hx   . Bladder Cancer Neg Hx   . Kidney cancer Neg Hx     Social History:  reports that he has quit smoking. He has never used smokeless tobacco. He reports previous alcohol use. He reports that he does not use drugs.  No Known Allergies  Medications: Prior to Admission: (Not in a hospital admission)   ROS: History obtained from the patient  General ROS: negative for - chills, fatigue, fever, night sweats, weight gain or weight loss Psychological ROS: negative for - behavioral disorder, hallucinations, memory difficulties,  mood swings or suicidal ideation Ophthalmic ROS: negative for - blurry vision, double vision, eye pain or loss of vision ENT ROS: negative for - epistaxis, nasal discharge, oral lesions, sore throat, tinnitus or vertigo Allergy and Immunology ROS: negative for - hives or itchy/watery eyes Hematological and Lymphatic ROS: negative for - bleeding problems, bruising or swollen lymph nodes Endocrine ROS: negative for - galactorrhea, hair pattern changes, polydipsia/polyuria or temperature intolerance Respiratory ROS: negative for - cough, hemoptysis, shortness of breath or wheezing Cardiovascular ROS: negative for - chest pain, dyspnea on exertion, edema or irregular heartbeat Gastrointestinal ROS: negative for - abdominal pain, diarrhea, hematemesis, nausea/vomiting or stool incontinence Genito-Urinary ROS: negative for - dysuria, hematuria, incontinence or urinary frequency/urgency Musculoskeletal ROS: negative for - joint swelling or muscular weakness Neurological ROS: as noted in HPI Dermatological ROS: negative for rash and skin lesion changes  Physical Examination: Blood pressure (!) 154/76, pulse 98, temperature 99.4 F (37.4 C), temperature source Oral, resp. rate 20, height 5\' 8"  (1.727 m), weight 61.2 kg, SpO2 98 %.   Neurological Examination   Mental Status: Alert, oriented, thought content appropriate.  Speech fluent without evidence of aphasia.  Able to follow 3 step commands without difficulty. Cranial Nerves: II: Discs flat bilaterally; Visual fields grossly normal, pupils equal, round, reactive to light and accommodation III,IV, VI: ptosis  not present, extra-ocular motions intact bilaterally V,VII: smile symmetric, facial light touch sensation normal bilaterally VIII: hearing normal bilaterally IX,X: gag reflex present XI: bilateral shoulder shrug XII: midline tongue extension Motor: Right : Upper extremity   5/5    Left:     Upper extremity   5/5  Lower extremity    4/5     Lower extremity   4/5 Tone and bulk:normal tone throughout; no atrophy noted Sensory: Pinprick and light touch intact throughout, bilaterally Deep Tendon Reflexes: 1+ and symmetric throughout     Laboratory Studies:   Basic Metabolic Panel: Recent Labs  Lab 04/12/2020 1641  NA 129*  K 3.5  CL 93*  CO2 25  GLUCOSE 108*  BUN 15  CREATININE 0.93  CALCIUM 8.2*    Liver Function Tests: Recent Labs  Lab 04/12/2020 1641  AST 164*  ALT 121*  ALKPHOS 61  BILITOT 2.0*  PROT 8.3*  ALBUMIN 3.5   No results for input(s): LIPASE, AMYLASE in the last 168 hours. Recent Labs  Lab 04/30/2020 2054  AMMONIA 10    CBC: Recent Labs  Lab 04/14/2020 1641  WBC 7.1  HGB 12.9*  HCT 38.1*  MCV 96.2  PLT 165    Cardiac Enzymes: Recent Labs  Lab 04/20/2020 1641  CKTOTAL 61    BNP: Invalid input(s): POCBNP  CBG: No results for input(s): GLUCAP in the last 168 hours.  Microbiology: Results for orders placed or performed during the hospital encounter of 04/18/2020  Respiratory Panel by RT PCR (Flu A&B, Covid) - Nasopharyngeal Swab     Status: None   Collection Time: 04/21/2020  7:41 PM   Specimen: Nasopharyngeal Swab  Result Value Ref Range Status   SARS Coronavirus 2 by RT PCR NEGATIVE NEGATIVE Final    Comment: (NOTE) SARS-CoV-2 target nucleic acids are NOT DETECTED. The SARS-CoV-2 RNA is generally detectable in upper respiratoy specimens during the acute phase of infection. The lowest concentration of SARS-CoV-2 viral copies this assay can detect is 131 copies/mL. A negative result does not preclude SARS-Cov-2 infection and should not be used as the sole basis for treatment or other patient management decisions. A negative result may occur with  improper specimen collection/handling, submission of specimen other than nasopharyngeal swab, presence of viral mutation(s) within the areas targeted by this assay, and inadequate number of viral copies (<131 copies/mL). A  negative result must be combined with clinical observations, patient history, and epidemiological information. The expected result is Negative. Fact Sheet for Patients:  PinkCheek.be Fact Sheet for Healthcare Providers:  GravelBags.it This test is not yet ap proved or cleared by the [place] FDA and  has been authorized for detection and/or diagnosis of SARS-CoV-2 by FDA under an Emergency Use Authorization (EUA). This EUA will remain  in effect (meaning this test can be used) for the duration of the COVID-19 declaration under Section 564(b)(1) of the Act, 21 U.S.C. section 360bbb-3(b)(1), unless the authorization is terminated or revoked sooner.    Influenza A by PCR NEGATIVE NEGATIVE Final   Influenza B by PCR NEGATIVE NEGATIVE Final    Comment: (NOTE) The Xpert Xpress SARS-CoV-2/FLU/RSV assay is intended as an aid in  the diagnosis of influenza from Nasopharyngeal swab specimens and  should not be used as a sole basis for treatment. Nasal washings and  aspirates are unacceptable for Xpert Xpress SARS-CoV-2/FLU/RSV  testing. Fact Sheet for Patients: PinkCheek.be Fact Sheet for Healthcare Providers: GravelBags.it This test is not yet approved or cleared by the  [place] Architectural technologist and  has been authorized for detection and/or diagnosis of SARS-CoV-2 by  FDA under an Print production planner (EUA). This EUA will remain  in effect (meaning this test can be used) for the duration of the  Covid-19 declaration under Section 564(b)(1) of the Act, 21  U.S.C. section 360bbb-3(b)(1), unless the authorization is  terminated or revoked. Performed at [facility], [address] [place], [place] [address]     Coagulation Studies: Recent Labs    04/09/2020 2053-03-09  LABPROT 11.7  INR 0.9    Urinalysis:  Recent Labs  Lab 04/25/2020 1641  COLORURINE YELLOW*   LABSPEC 1.008  PHURINE 6.0  GLUCOSEU NEGATIVE  HGBUR SMALL*  BILIRUBINUR NEGATIVE  KETONESUR 5*  PROTEINUR NEGATIVE  NITRITE NEGATIVE  LEUKOCYTESUR NEGATIVE    Lipid Panel:     Component Value Date/Time   CHOL 234 (H) 04/14/2020 0609   TRIG 72 04/14/2020 0609   HDL 128 04/14/2020 0609   CHOLHDL 1.8 04/14/2020 0609   VLDL 14 04/14/2020 0609   LDLCALC 92 04/14/2020 0609    HgbA1C:  Lab Results  Component Value Date   HGBA1C 5.6 04/14/2020    Urine Drug Screen:      Component Value Date/Time   LABOPIA NONE DETECTED 04/19/2020 2053/03/09   COCAINSCRNUR NONE DETECTED 04/19/2020 2053-03-09   LABBENZ NONE DETECTED 05/01/2020 2053-03-09   AMPHETMU NONE DETECTED 04/19/2020 03-09-53   THCU NONE DETECTED 04/30/2020 09-Mar-2053   LABBARB NONE DETECTED 04/11/2020 2053/03/09    Alcohol Level:  Recent Labs  Lab 04/29/2020 Mar 09, 2053  ETH <10    Other results: EKG: normal EKG, normal sinus rhythm, unchanged from previous tracings.  Imaging: CT Head Wo Contrast  Result Date: 04/12/2020 CLINICAL DATA:  Balance difficulty EXAM: CT HEAD WITHOUT CONTRAST TECHNIQUE: Contiguous axial images were obtained from the base of the skull through the vertex without intravenous contrast. COMPARISON:  CT 12/16/2019, MRI 5 /10/2019 FINDINGS: Brain: No acute territorial infarction or hemorrhage is visualized. 16 mm slightly dense mass at the left foramen of Monro, consistent with colloid cysts, without significant change. Moderate atrophy. Stable ventricular prominence. Patchy white matter hypodensity consistent with chronic small vessel ischemic change. Vascular: No hyperdense vessel.  Carotid vascular calcification Skull: Normal. Negative for fracture or focal lesion. Sinuses/Orbits: Mucosal thickening in the left maxillary sinus Other: None IMPRESSION: 1. No CT evidence for acute intracranial abnormality 2. 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 3. Atrophy and chronic small vessel ischemic change of the white matter  Electronically Signed   By: Donavan Foil M.D.   On: 04/23/2020 20:49   MR BRAIN WO CONTRAST  Result Date: 04/14/2020 CLINICAL DATA:  Initial evaluation for acute ataxia, dizziness, leg weakness. EXAM: MRI HEAD WITHOUT CONTRAST TECHNIQUE: Multiplanar, multiecho pulse sequences of the brain and surrounding structures were obtained without intravenous contrast. COMPARISON:  Comparison made with prior CT from earlier same day as well as previous brain MRI from 04/20/2019. FINDINGS: Brain: Diffuse prominence of the CSF containing spaces compatible generalized age-related cerebral atrophy. Mild patchy T2/FLAIR hyperintensity within the periventricular deep white matter both cerebral hemispheres most consistent with chronic small vessel ischemic disease, mild for age. No abnormal foci of restricted diffusion to suggest acute or subacute ischemia. Gray-white matter differentiation maintained. No encephalomalacia to suggest chronic cortical infarction. No foci of susceptibility artifact to suggest acute or chronic intracranial hemorrhage. Colloid cyst positioned at the left aspect of the foramen of Monro again seen, relatively stable in size  measuring 16 x 10 x 10 mm. Mild ventriculomegaly is unchanged, most likely related to underlying parenchymal volume loss. No evidence for obstructive hydrocephalus. No other mass lesion, mass effect, or midline shift. No extra-axial fluid collection. Small DVA noted at the left cerebellum, stable. Pituitary gland and suprasellar region normal. Midline structures intact. Vascular: Major intracranial vascular flow voids are maintained. Skull and upper cervical spine: Craniocervical junction within normal limits. Bone marrow signal intensity normal. No scalp soft tissue abnormality. Sinuses/Orbits: Globes and orbital soft tissues within normal limits. Mild-to-moderate mucosal thickening noted within the left maxillary sinus, likely allergic/inflammatory in nature. Paranasal sinuses are  otherwise clear. No mastoid effusion. Inner ear structures grossly normal. Other: None. IMPRESSION: 1. No acute intracranial abnormality. 2. Colloid cyst positioned at the left foramen of Monro, stable from previous. Stable ventricular size without evidence for obstructive hydrocephalus. 3. Underlying age-related cerebral atrophy with mild chronic small vessel ischemic disease. Electronically Signed   By: Jeannine Boga M.D.   On: 04/14/2020 01:39   US Carotid Bilateral (at [facility] and [facility] only)  Result Date: 04/14/2020 CLINICAL DATA:  TIA. EXAM: BILATERAL CAROTID DUPLEX ULTRASOUND TECHNIQUE: Pearline Cables scale imaging, color Doppler and duplex ultrasound were performed of bilateral carotid and vertebral arteries in the neck. COMPARISON:  None. FINDINGS: Criteria: Quantification of carotid stenosis is based on velocity parameters that correlate the residual internal carotid diameter with NASCET-based stenosis levels, using the diameter of the distal internal carotid lumen as the denominator for stenosis measurement. The following velocity measurements were obtained: RIGHT ICA: 87/22 cm/sec CCA: 123456 cm/sec SYSTOLIC ICA/CCA RATIO:  1.5 ECA: 108 cm/sec LEFT ICA: 98/27 cm/sec CCA: 0000000 cm/sec SYSTOLIC ICA/CCA RATIO:  1.5 ECA: 120 cm/sec RIGHT CAROTID ARTERY: Echogenic calcified plaque at the right carotid bulb. External carotid artery is patent with normal waveform. Normal waveforms and velocities in the internal carotid artery. RIGHT VERTEBRAL ARTERY: Antegrade flow and normal waveform in the right vertebral artery. LEFT CAROTID ARTERY: Echogenic calcified plaque at the left carotid bulb. External carotid artery is patent with normal waveform. Normal waveforms and velocities in the internal carotid artery. LEFT VERTEBRAL ARTERY: Antegrade flow and normal waveform in the left vertebral artery. IMPRESSION: 1. Atherosclerotic plaque at the carotid bulbs bilaterally. Estimated degree of stenosis in the internal carotid  arteries is less than 50% bilaterally. 2. Patent vertebral arteries with antegrade flow. Electronically Signed   By: Markus Daft M.D.   On: 04/14/2020 10:31     Assessment/Plan:  79 y.o. male with a known history of hypertension, osteoarthritis, alcohol use and C. difficile colitis, who presented to the emergency room with acute onset of ataxia and inability to ambulate that has been progressing over last month to few weeks.  He had a fall in January and a couple weeks ago.  Today he had to use his walker due to significant weakness.  He drinks a couple of beer per night not on a regular basis.  He lives by himself.  He denies any paresthesias or other focal muscle weakness.  No nausea or vomiting or diarrhea or abdominal pain.  No headache or dizziness or blurred vision.  Patient was noted to have intention tremors in the ER and when he stood up to walk he was unsteady and needed 2 person assist with a shuffling like gait.  - MRI no acute abnormalities - symptoms likely metabolic related in setting of electrolyte imbalance - will check B12 - I don't think this is Parkinson's given the lack of tremor  and tone is not abnormal without any cog-wheel rigidity - Thiamine 500 IV for 1-2 days depending when he gets d/c - Folate - pt/ot 04/14/2020, 11:59 AM

## 2020-04-14 NOTE — Evaluation (Addendum)
Clinical/Bedside Swallow Evaluation Patient Details  Name: Henry Mays MRN: [id number] Date of Birth: 09-Jun-1941  Today's Date: 04/14/2020 Time: SLP Start Time (ACUTE ONLY): 0900 SLP Stop Time (ACUTE ONLY): 1000 SLP Time Calculation (min) (ACUTE ONLY): 60 min  Past Medical History:  Past Medical History:  Diagnosis Date  . Arthritis   . Basal cell carcinoma   . Chickenpox   . History of Clostridium difficile infection   . Hypertension   . MGUS (monoclonal gammopathy of unknown significance)   . Near syncope    Past Surgical History:  Past Surgical History:  Procedure Laterality Date  . BASAL CELL CARCINOMA EXCISION    . HERNIA REPAIR     HPI:  Pt is a 79 y.o. Caucasian male with a known history of hypertension, osteoarthritis, alcohol abuse and C. difficile colitis, who presented to the emergency room with acute onset of ataxia and inability to ambulate.  He had a fall in January and a couple weeks ago.  Today he had to use his walker without he was feeling significantly weak in both legs and unable to ambulate.  He drinks a couple of beer per night not on a regular basis.  He lives by himself.  He denies any paresthesias or other focal muscle weakness.  No nausea or vomiting or diarrhea or abdominal pain.  No headache or dizziness or blurred vision.  Patient was noted to have intention tremors in the ER and when he stood up to walk he was unsteady and needed 2 person assist with a shuffling like gait.  Noted LOW Sodium.  MRI: "No acute intracranial abnormality; age related cerebral atrophy".   Assessment / Plan / Recommendation Clinical Impression  Pt appears to present w/ adequate oropharyngeal phase swallow function w/ No oropharyngeal phase dysphagia noted, No Neuromuscular deficits impacting swallowing.Pt consumed po trials w/ No overt, clinical s/s of aspiration during po trials. He appears at reduced risk for aspiration following general aspiration precautions. During po  trials, pt consumed all consistencies w/ no overt coughing, decline in vocal quality, or change in respiratory presentation during/post trials. Oral phase appeared [facility] w/ timely bolus management, mastication, and control of bolus propulsion for A-P transfer for swallowing. Oral clearing achieved w/ all trial consistencies. OM Exam appeared grossly [facility] w/ no unilateral weakness noted; slight-min tremorous lingual activity noted. Speech Clear and Intelligible. Pt fed self w/ setup support. Recommend a Regular consistency diet w/ Cut meats, moistened foods; Thin liquids. Recommend general aspiration precautions, Pills WHOLE in Puree for safer, easier swallowing as needed. Education given on Pills in Puree; food consistencies and easy to eat options; general aspiration precautions. NSG to reconsult if any new needs arise. NSG agreed.  Of note, pt's speech intelligibility is 100%; no overt, apparent deficits in speech-language noted at BSE - pt followed all instructions, no receptive/expressive deficits. Pt denied any speech-communication or language deficits. No further skilled f/u indicated. Pt's Sodium is LOW at this admit and may have impacted his overall communication presentation initially. SLP Visit Diagnosis: Dysphagia, unspecified (R13.10)    Aspiration Risk  (reduced following precautions)    Diet Recommendation  Regular diet w/ cut/moist foods for ease; Thin liquids. General aspiration and Reflux precautions.  Medication Administration: Whole meds with liquid(OR Whole in Puree if easier for swallowing)    Other  Recommendations Recommended Consults: (Dietician f/u) Oral Care Recommendations: Oral care BID;Oral care before and after PO;Patient independent with oral care Other Recommendations: (n/a)   Follow up Recommendations None  Frequency and Duration (n/a)  (n/a)       Prognosis Prognosis for Safe Diet Advancement: Good Barriers to Reach Goals: (n/a)      Swallow Study    General Date of Onset: 04/24/2020 HPI: Pt is a 79 y.o. Caucasian male with a known history of hypertension, osteoarthritis, alcohol abuse and C. difficile colitis, who presented to the emergency room with acute onset of ataxia and inability to ambulate.  He had a fall in January and a couple weeks ago.  Today he had to use his walker without he was feeling significantly weak in both legs and unable to ambulate.  He drinks a couple of beer per night not on a regular basis.  He lives by himself.  He denies any paresthesias or other focal muscle weakness.  No nausea or vomiting or diarrhea or abdominal pain.  No headache or dizziness or blurred vision.  Patient was noted to have intention tremors in the ER and when he stood up to walk he was unsteady and needed 2 person assist with a shuffling like gait.  Noted LOW Sodium.  MRI: "No acute intracranial abnormality; age related cerebral atrophy". Type of Study: Bedside Swallow Evaluation Previous Swallow Assessment: none Diet Prior to this Study: NPO(regular diet at home) Temperature Spikes Noted: No(wbc 7.1) Respiratory Status: Room air History of Recent Intubation: No Behavior/Cognition: Alert;Cooperative;Pleasant mood(speech clear; upper denture plate, partial) Oral Cavity Assessment: Dry(sticky - min) Oral Care Completed by SLP: Yes Oral Cavity - Dentition: Dentures, top(bottom partial) Vision: Functional for self-feeding Self-Feeding Abilities: Able to feed self;Needs set up Patient Positioning: Upright in bed(needed min support) Baseline Vocal Quality: Normal Volitional Cough: Strong Volitional Swallow: Able to elicit    Oral/Motor/Sensory Function Overall Oral Motor/Sensory Function: Within functional limits(slight-min tremorous lingual activity)   Ice Chips Ice chips: Within functional limits Presentation: Spoon(fed; 2 trials)   Thin Liquid Thin Liquid: Within functional limits Presentation: Cup;Self Fed;Straw(~4 ozs total)    Nectar  Thick Nectar Thick Liquid: Not tested   Honey Thick Honey Thick Liquid: Not tested   Puree Puree: Within functional limits Presentation: Self Fed;Spoon(6 trials)   Solid     Solid: Within functional limits Presentation: Self Fed(4 crackers)        Orinda Kenner, MS, CCC-SLP [place] [facility] 04/14/2020,11:14 AM

## 2020-04-14 NOTE — ED Notes (Signed)
Report given to [place], [place]

## 2020-04-14 NOTE — ED Notes (Signed)
Patient assisted to bathroom by edt.

## 2020-04-14 NOTE — ED Notes (Signed)
Unable to perform swallow study at this time due to pt sleeping. Will attempt again when pt wakes for toileting.

## 2020-04-14 NOTE — ED Notes (Addendum)
Pt to MRI; to be taken to C-POD room 35 when completed. Report given to [facility] RN who will be assuming pt care at that time.

## 2020-04-14 NOTE — Evaluation (Addendum)
Physical Therapy Evaluation Patient Details Name: Henry Mays MRN: [id number] DOB: 1941/11/02 Today's Date: 04/14/2020   History of Present Illness  Henry Mays is a 23yoM who comes to [facility] on 5/5 c BLE weakness and unsteadiness. Pt reports this  began about 2 weeks ago, insidious onset, severe general weakness in BLE, no focal strenght loss or paresthesias. Pt reports prior to 2 weeks ago he would walk a few tiems weekly for exercise outside without device, now he is unable to AMB without using a RW. PMH: HTN, OA< ETOH abuse, C Diff collitis. Imaging thus far unremarkable. Seen by neurology already.  Clinical Impression  Pt admitted with above diagnosis. Pt currently with functional limitations due to the deficits listed below (see "PT Problem List"). Upon entry, pt in bed, awake and agreeable to participate. The pt is alert and oriented x4, pleasant, conversational, and generally a good historian. Exam reveals no focal motor weakness, paresthesias, or UMN signs. Pt has poor accuracy with finger to nose on right and bilat dysmetria but this may be 2/2 intentional tremor. Pt reports continued dysfunction but without progression now for 2 weeks since onset. Pt able to be safe and independent with RW, has good awareness of his current limitations and good insight. Pt AMB to BR in hallway, then down another hallway totaling >266ft c RW. Functional mobility assessment demonstrates increased effort/time requirements, poor tolerance, and need for physical assistance, whereas the patient performed these at a higher level of independence PTA. Pt will benefit from skilled PT intervention to increase independence and safety with basic mobility in preparation for discharge to the venue listed below.       Follow Up Recommendations Home health PT;Supervision - Intermittent    Equipment Recommendations  None recommended by PT    Recommendations for Other Services       Precautions / Restrictions  Precautions Precautions: Fall Restrictions Weight Bearing Restrictions: No      Mobility  Bed Mobility Overal bed mobility: Modified Independent                Transfers Overall transfer level: Modified independent Equipment used: Rolling walker (2 wheeled)                Ambulation/Gait Ambulation/Gait assistance: Modified independent (Device/Increase time) Gait Distance (Feet): 160 Feet Assistive device: Rolling walker (2 wheeled) Gait Pattern/deviations: Trunk flexed;Shuffle     General Gait Details: confident and safe with RW  Stairs            Wheelchair Mobility    Modified Rankin (Stroke Patients Only)       Balance Overall balance assessment: History of Falls(falls anxiety with standing unsupported.)                                           Pertinent Vitals/Pain Pain Assessment: No/denies pain    Home Living Family/patient expects to be discharged to:: Private residence(village at [place]) Living Arrangements: Alone Available Help at Discharge: (2 sons ([place] Type of Home: ([place]) Home Access: Level entry     Home Layout: One level Home Equipment: Walker - 2 wheels;Grab bars - toilet;Grab bars - tub/shower      Prior Function Level of Independence: Independent with assistive device(s)         Comments: independent in ADL/IADL     Hand Dominance  Extremity/Trunk Assessment   Upper Extremity Assessment Upper Extremity Assessment: Overall WFL for tasks assessed    Lower Extremity Assessment Lower Extremity Assessment: Overall WFL for tasks assessed(5/5 in hip flexion, abduction, knee extension/flexion, ankle DF/PF. Apepars to have WNL ROM. No clonus of ankle.)       Communication   Communication: No difficulties  Cognition Arousal/Alertness: Awake/alert Behavior During Therapy: WFL for tasks assessed/performed Overall Cognitive Status: Within Functional Limits for  tasks assessed                                        General Comments      Exercises     Assessment/Plan    PT Assessment Patient needs continued PT services  PT Problem List Decreased strength;Decreased activity tolerance;Decreased mobility       PT Treatment Interventions DME instruction;Balance training;Gait training;Stair training;Functional mobility training;Therapeutic activities;Therapeutic exercise;Patient/family education    PT Goals (Current goals can be found in the Care Plan section)  Acute Rehab PT Goals Patient Stated Goal: Regain baseline strength in legs PT Goal Formulation: With patient Time For Goal Achievement: 04/28/20 Potential to Achieve Goals: Good    Frequency 7X/week   Barriers to discharge Decreased caregiver support lives alone    Co-evaluation               AM-PAC PT "6 Clicks" Mobility  Outcome Measure Help needed turning from your back to your side while in a flat bed without using bedrails?: None Help needed moving from lying on your back to sitting on the side of a flat bed without using bedrails?: None Help needed moving to and from a bed to a chair (including a wheelchair)?: None Help needed standing up from a chair using your arms (e.g., wheelchair or bedside chair)?: None Help needed to walk in hospital room?: A Little Help needed climbing 3-5 steps with a railing? : A Little 6 Click Score: 22    End of Session Equipment Utilized During Treatment: Gait belt Activity Tolerance: Patient tolerated treatment well;No increased pain Patient left: in bed;with call bell/phone within reach;with nursing/sitter in room Nurse Communication: Mobility status PT Visit Diagnosis: Unsteadiness on feet (R26.81);Difficulty in walking, not elsewhere classified (R26.2);Other abnormalities of gait and mobility (R26.89)    Time: 1345-1420 PT Time Calculation (min) (ACUTE ONLY): 35 min   Charges:   PT Evaluation $PT Eval Low  Complexity: 1 Low PT Treatments $Gait Training: 8-22 mins        4:22 PM, 04/14/20 Etta Grandchild, PT, DPT Physical Therapist - [facility]  [phone] ([place])   Halbur C 04/14/2020, 4:19 PM

## 2020-04-14 NOTE — TOC Initial Note (Signed)
Transition of Care [facility]) - Initial/Assessment Note    Patient Details  Name: Henry Mays MRN: [id number] Date of Birth: June 20, 1941  Transition of Care [facility]) CM/SW Contact:    Shelbie Hutching, RN Phone Number: 04/14/2020, 4:37 PM  Clinical Narrative:                 Patient placed in observation for weakness and ataxia.  RNCM introduced self and explained role in discharge planning.  Patient is from the [place], reports he has been there for 4-5 years.  PT consult is pending.  RNCM will follow up with patient about discharge needs tomorrow.   Expected Discharge Plan: [place] Barriers to Discharge: Continued Medical Work up   Patient Goals and CMS Choice   CMS Medicare.gov Compare Post Acute Care list provided to:: Patient Choice offered to / list presented to : Patient  Expected Discharge Plan and Services Expected Discharge Plan: [place] Choice: [place] arrangements for the past 2 months: [place]                                      Prior Living Arrangements/Services Living arrangements for the past 2 months: [place] Lives with:: Self Patient language and need for interpreter reviewed:: Yes              Criminal Activity/Legal Involvement Pertinent to Current Situation/Hospitalization: No - Comment as needed  Activities of Daily Living      Permission Sought/Granted                  Emotional Assessment Appearance:: Appears stated age Attitude/Demeanor/Rapport: Engaged Affect (typically observed): Accepting Orientation: : Oriented to Self, Oriented to Place, Oriented to  Time, Oriented to Situation Alcohol / Substance Use: Not Applicable Psych Involvement: No (comment)  Admission diagnosis:  TIA (transient ischemic attack) [G45.9] Ataxia [R27.0] Hyponatremia [E87.1] Difficulty walking [R26.2] Patient  Active Problem List   Diagnosis Date Noted  . Balance problem 05/02/2020  . Ataxia 05/03/2020  . Cerumen impaction 08/28/2019  . Personal history of fall 08/28/2019  . Skin cancer 08/03/2019  . Elevated PSA 07/04/2018  . Mouth sores 07/04/2018  . Dental anomaly 04/01/2018  . Plantar wart of right foot 05/14/2017  . PVC (premature ventricular contraction) 08/28/2016  . Intractable hiccups 08/07/2016  . 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 Osteoporosis 05/22/2016  . Decreased right shoulder range of motion 04/10/2016  . MGUS (monoclonal gammopathy of unknown significance) 04/10/2016   PCP:  Leone Haven, MD Pharmacy:   [facility], [place] [address] [address] Phone: [phone] Fax: [place], [place] - [address] [place] [address] Phone: [phone] Fax: [phone]  [place], [place] - [place] [address] Phone: [phone] Fax: [phone]     Social Determinants of Health (SDOH) Interventions    Readmission Risk Interventions No flowsheet data found.

## 2020-04-14 NOTE — Care Management Obs Status (Signed)
[place] NOTIFICATION   Patient Details  Name: Henry Mays MRN: [id number] Date of Birth: [birth year]   Medicare Observation Status Notification Given:  Yes    Shelbie Hutching, RN 04/14/2020, 4:36 PM

## 2020-04-14 NOTE — Progress Notes (Signed)
*  PRELIMINARY RESULTS* Echocardiogram 2D Echocardiogram has been performed.  Sherrie Sport 04/14/2020, 10:43 AM

## 2020-04-14 NOTE — ED Notes (Signed)
Pt assisted to bedside to urinate. Pt placed back into bed and legs elevated.

## 2020-04-14 NOTE — ED Notes (Signed)
Pt just got back from MRI. Pt is laying in bed, side rails up, call bell within reach. Pt denies pain. Pt reports coming into the ED because he is unable to walk without a walker, and that is not normal for him. Pt laying in bed, no apparent distress noted.

## 2020-04-14 NOTE — ED Notes (Addendum)
IVF started. Pt stated at this time he does not want to do the swallow study, but states later. Pt resting in bed, lights modified for comfort. Pt states he is going to try and get some more rest.

## 2020-04-14 NOTE — Progress Notes (Addendum)
PROGRESS NOTE  Boby Podgorny [id number] DOB: [birth year] DOA: 04/25/2020 PCP: Leone Haven, MD  Brief History   Henry Mays  is a 79 y.o. Caucasian male with a known history of hypertension, osteoarthritis, alcohol abuse and C. difficile colitis, who presented to the emergency room with acute onset of ataxia and inability to ambulate.  He had a fall in January and a couple weeks ago.  Today he had to use his walker without he was feeling significantly weak in both legs and unable to ambulate.  He drinks a couple of beer per night not on a regular basis.  He lives by himself.  He denies any paresthesias or other focal muscle weakness.  No nausea or vomiting or diarrhea or abdominal pain.  No headache or dizziness or blurred vision.  Patient was noted to have intention tremors in the ER and when he stood up to walk he was unsteady and needed 2 person assist with a shuffling like gait.  Upon presentation to the emergency room, temperature was 99.4 and heart rate 103 with otherwise normal vital signs.  Blood pressure later on was 175/97 and later improved.  Labs revealed hyponatremia 129 with hypochloremia 93 and borderline potassium of 3.5.  AST was 164 and ALT 121 with total protein of 8.3 with albumin of 3.5.  Indirect bili was 1.6 and total bili 2 with direct bili of 0.4 and ammonia level 10.  CBC showed mild anemia.  COVID-19 PCR and influenza antigens came back negative.  Urine drug screen came back negative.  Alcohol levels less than 10.  Noncontrasted head CT scan revealed no acute intracranial abnormalities.  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 EKG showed normal sinus rhythm with a rate of 94 with slightly poor R wave progression.  The patient was given 500 mL IV normal saline bolus as well as IV thiamine and placed on CIWA protocol.  The patient will be admitted to an observation medical  monitored bed for further evaluation and management.  The patient has been evaluated by neurology. They have recommended thiamine 500 mg IV x 2 days followed by PT as outpatient.  Consultants  . Neurology  Procedures  . None  Antibiotics   Anti-infectives (From admission, onward)   None    .  Subjective  The patient is resting quietly. He states that he thinks his gait is a little better although he still required assistance to get to the bathroom  Objective   Vitals:  Vitals:   04/14/20 1627 04/14/20 1711  BP: (!) 161/67 (!) 163/78  Pulse: 87 89  Resp: (!) 21   Temp: 98.2 F (36.8 C) 98.1 F (36.7 C)  SpO2: 99% 99%   Exam:  Constitutional:  . The patient is awake, alert, and oriented x 3. No acute distress. Respiratory:  . No increased work of breathing. . No wheezes, rales, or rhonchi . No tactile fremitus Cardiovascular:  . Regular rate and rhythm . No murmurs, ectopy, or gallups. . No lateral PMI. No thrills. Abdomen:  . Abdomen is soft, non-tender, non-distended . No hernias, masses, or organomegaly . Normoactive bowel sounds.  Musculoskeletal:  . No cyanosis, clubbing, or edema Skin:  . No rashes, lesions, ulcers . palpation of skin: no induration or nodules Neurologic:  . CN 2-12 intact . Sensation all 4 extremities intact . Intention tremor present Psychiatric:  . Mental status o Mood: depressed  o Affect: flat o Orientation to person, place, time  . judgment and insight appear intact  I have personally reviewed the following:   Today's Data  . Vitals, Lipid panel.  Imaging  . MRI brain: Atrophy and chronic smaall vessel disease. Colloid cyst at left foramen of Monro. Stable. No sign of obstructive hydrocephalus. BL carotid doppler: Atherosclerotic plaque at the carotid bulbs bilaterally. Estimated degree of stenosis in the internal carotid arteries is less than 50% bilaterally. Patent vertebral arteries with antegrade  flow. .   Cardiology Data  . Echocardiogram: EF of 55-60%. Normal LV function. No regional wall motion abnormalities. Normal diastolic parameters. RV systolic function is normal. Normal PASP. No atrial level shunts. No intracardiac source of thrombus documented.  Scheduled Meds: .  stroke: mapping our early stages of recovery book   Does not apply Once  . aspirin EC  81 mg Oral Daily  . dexamethasone  0.25 mg Oral Daily  . enoxaparin (LOVENOX) injection  40 mg Subcutaneous Q24H  . folic acid  1 mg Oral Daily  . multivitamin with minerals  1 tablet Oral Daily   Continuous Infusions: . sodium chloride 100 mL/hr at 04/14/20 0223  . thiamine injection Stopped (04/14/20 1533)    Active Problems:   Ataxia   Unable to walk   LOS: 0 days   A & P  Ataxia with unsteady gait and recent recurrent falls and inability to ambulate: CVA ruled out. Neurology was consulted and has ruled out parkinsons. He has suggested possibly Wernickes, and has recommended 2 days of high dose (500mg ) IV thiamine. This has been started. He also feels that electrolyte abnormalities are playing a role. These will continue to be addressed and evaluated. The patient will be evaluated by PT/OT and will need to continue therapy after he leaves the hospital. Continue daily aspirin. Will also add simvastatin.  Alcohol abuse with elevated LFTs due to alcoholic hepatitis: Counsel alcohol rehab and cessation of use. Continue supplementation with folic acid, vitamins and high dose IV thiamine as recommended by neurology. As needed ativan is available for symptoms of withdrawal.  Mild hyponatremia and hypochloremia: Likely due to ETOH use. Will place on fluid restriction. Monitor..  Hypertensive urgency: Blood pressures remain elevated. No CVA, so need for permissive hypertension. Will add Norvasc and as needed hydralazine.  I have seen and examined this patient myself. I have spent 34 minutes in his evaluation and  care.  DVT prophylaxis: Lovenox CODE STATUS: Full Code Family Communication: None available. Disposition: Patient is from home. Disposition at discharge anticipated to be to home with home health PT as recommended by PT. Barriers to discharge: Hyponatremia, Hypochloremia, IV thiamine x 2 days.  Korbyn Chopin, DO [facility] Direct contact: see [email or web address]  7PM-7AM contact night coverage as above 04/14/2020, 5:55 PM  LOS: 0 days

## 2020-04-14 NOTE — ED Notes (Signed)
Sherie handed off care to [place], Therapist, sports

## 2020-04-15 ENCOUNTER — Inpatient Hospital Stay: Payer: Medicare Other

## 2020-04-15 DIAGNOSIS — I160 Hypertensive urgency: Secondary | ICD-10-CM

## 2020-04-15 DIAGNOSIS — R0902 Hypoxemia: Secondary | ICD-10-CM

## 2020-04-15 DIAGNOSIS — E43 Unspecified severe protein-calorie malnutrition: Secondary | ICD-10-CM | POA: Insufficient documentation

## 2020-04-15 DIAGNOSIS — E512 Wernicke's encephalopathy: Secondary | ICD-10-CM

## 2020-04-15 DIAGNOSIS — F1010 Alcohol abuse, uncomplicated: Secondary | ICD-10-CM

## 2020-04-15 LAB — BASIC METABOLIC PANEL
Anion gap: 8 (ref 5–15)
BUN: 12 mg/dL (ref 8–23)
CO2: 24 mmol/L (ref 22–32)
Calcium: 7.6 mg/dL — ABNORMAL LOW (ref 8.9–10.3)
Chloride: 104 mmol/L (ref 98–111)
Creatinine, Ser: 0.73 mg/dL (ref 0.61–1.24)
GFR calc Af Amer: >60 mL/min (ref >60)
GFR calc non Af Amer: >60 mL/min (ref >60)
Glucose, Bld: 91 mg/dL (ref 70–99)
Potassium: 3.7 mmol/L (ref 3.5–5.1)
Sodium: 136 mmol/L (ref 135–145)

## 2020-04-15 MED ORDER — IPRATROPIUM-ALBUTEROL 0.5-2.5 (3) MG/3ML IN SOLN
3.0000 mL | Freq: Four times a day (QID) | RESPIRATORY_TRACT | Status: DC
  Administered 2020-04-15 – 2020-04-16 (×3): 3 mL via RESPIRATORY_TRACT
  Filled 2020-04-15 (×5): qty 3

## 2020-04-15 MED ORDER — FOLIC ACID 1 MG PO TABS: 1.0000 mg | ORAL_TABLET | Freq: Every day | ORAL | 0 refills | Status: AC

## 2020-04-15 MED ORDER — THIAMINE HCL 100 MG PO TABS
100.0000 mg | ORAL_TABLET | Freq: Every day | ORAL | 0 refills | Status: AC
Start: 2020-04-15 — End: ?

## 2020-04-15 MED ORDER — ASCORBIC ACID 500 MG PO TABS
500.0000 mg | ORAL_TABLET | Freq: Every day | ORAL | Status: DC
  Administered 2020-04-15 – 2020-04-19 (×5): 500 mg via ORAL
  Filled 2020-04-15 (×5): qty 1

## 2020-04-15 MED ORDER — ADULT MULTIVITAMIN W/MINERALS CH: 1.0000 | ORAL_TABLET | Freq: Every day | ORAL | 0 refills | Status: AC

## 2020-04-15 MED ORDER — ASPIRIN 81 MG PO TBEC: 81.0000 mg | DELAYED_RELEASE_TABLET | Freq: Every day | ORAL | 0 refills | Status: AC

## 2020-04-15 MED ORDER — ENSURE ENLIVE PO LIQD
237.0000 mL | Freq: Two times a day (BID) | ORAL | Status: DC
  Administered 2020-04-15 – 2020-04-18 (×4): 237 mL via ORAL

## 2020-04-15 NOTE — Evaluation (Signed)
Occupational Therapy Evaluation Patient Details Name: Henry Mays MRN: [id number] DOB: 17-Mar-1941 Today's Date: 04/15/2020    History of Present Illness Henry Mays is a 70yoM who comes to [facility] on 5/5 c BLE weakness and unsteadiness. Pt reports this  began about 2 weeks ago, insidious onset, severe general weakness in BLE, no focal strenght loss or paresthesias. Pt reports prior to 2 weeks ago he would walk a few tiems weekly for exercise outside without device, now he is unable to AMB without using a RW. PMH: HTN, OA< ETOH abuse, C Diff collitis. Imaging thus far unremarkable. Seen by neurology already.   Clinical Impression   Henry Mays was seen for OT evaluation this date. Prior to hospital admission, pt was MOD I for mobility and ADLs using RW. Pt lives alone in [place]. Pt presents to acute OT demonstrating impaired ADL performance and functional mobility 2/2 decreased activity tolerance and functional strength deficits. Pt currently requires SUPERVISION to don B socks seated EOB - O2 desat 86% on RA, HR 102 during LB, pt required MIN VCs for PLB and rest breaks to resolve. MOD I c increased time for tooth brushing and simulated UBD seated EOB. Pt would benefit from skilled OT to address noted impairments and functional limitations (see below for any additional details) in order to maximize safety and independence while minimizing falls risk and caregiver burden. Upon hospital discharge, recommend HHOT to maximize pt safety and return to functional independence during meaningful occupations of daily life.    Follow Up Recommendations  Home health OT;Supervision - Intermittent    Equipment Recommendations       Recommendations for Other Services       Precautions / Restrictions Precautions Precautions: Fall Restrictions Weight Bearing Restrictions: No      Mobility Bed Mobility Overal bed mobility: Needs Assistance Bed Mobility: Sit to Supine;Supine to Sit     Supine to sit: HOB  elevated(Increased time, HOB elevated, and VCs for rest breaks/PLB) Sit to supine: HOB elevated(Increased time, HOB elevated, and VCs for rest breaks/PLB)   General bed mobility comments: Pt demonstrated increased WoB c bed mobility - SpO2 desat to 86% on RA - resolved c PLB and seated rest breaks  Transfers                 General transfer comment: Pt deferred OOB mobility stating fatigue and desire to work with PT this AM     Balance                                           ADL either performed or assessed with clinical judgement   ADL Overall ADL's : Needs assistance/impaired                                       General ADL Comments: SUP don B socks seated EOB c VCs for PLB. MOD I tooth brushing seated EOB c increased time. MOD I simulated UBD seated EOB     Vision Baseline Vision/History: Wears glasses Wears Glasses: At all times       Perception     Praxis      Pertinent Vitals/Pain Pain Assessment: No/denies pain     Hand Dominance Right   Extremity/Trunk Assessment Upper Extremity Assessment Upper Extremity Assessment: Overall WFL for  tasks assessed(Intention tremor noted )   Lower Extremity Assessment Lower Extremity Assessment: Overall WFL for tasks assessed       Communication Communication Communication: No difficulties   Cognition Arousal/Alertness: Awake/alert Behavior During Therapy: WFL for tasks assessed/performed Overall Cognitive Status: Within Functional Limits for tasks assessed                                     General Comments  Seated EOB donning B socks: SpO2 86% on RA, HR 102 - resolved to 98% on RA c seated rest break and PLB     Exercises Exercises: Other exercises Other Exercises Other Exercises: Pt educated re: falls prevention, OT role, energy conservation, IS (provided) frequency and use, home/routines modifications Other Exercises: Tooth brushing, don B socks, bed  mobility, sup<>sit, IS,    Shoulder Instructions      Home Living Family/patient expects to be discharged to:: Private residence(village at [place]) Living Arrangements: Alone Available Help at Discharge: (2 sons (Henry Mays and Henry Mays)) Type of Home: ( [place]) Home Access: Level entry     Home Layout: One level     Bathroom Shower/Tub: Walk-in shower         Home Equipment: Environmental consultant - 2 wheels;Grab bars - toilet;Grab bars - tub/shower          Prior Functioning/Environment Level of Independence: Independent with assistive device(s)        Comments: independent in ADL/IADL        OT Problem List: Decreased strength;Decreased activity tolerance;Decreased knowledge of use of DME or AE      OT Treatment/Interventions: Self-care/ADL training;Therapeutic exercise;Neuromuscular education;Energy conservation;DME and/or AE instruction;Therapeutic activities;Patient/family education;Balance training    OT Goals(Current goals can be found in the care plan section) Acute Rehab OT Goals Patient Stated Goal: Regain baseline strength in legs OT Goal Formulation: With patient Time For Goal Achievement: 04/29/20 Potential to Achieve Goals: Good ADL Goals Pt Will Perform Grooming: with modified independence;sitting(c no cues for rest breaks) Pt Will Perform Lower Body Dressing: with modified independence;sitting/lateral leans Pt Will Transfer to Toilet: stand pivot transfer;bedside commode;with supervision(c LRAD PRN)  OT Frequency: Min 1X/week   Barriers to D/C: Decreased caregiver support          Co-evaluation              AM-PAC OT "6 Clicks" Daily Activity     Outcome Measure Help from another person eating meals?: None Help from another person taking care of personal grooming?: None Help from another person toileting, which includes using toliet, bedpan, or urinal?: A Little Help from another person bathing (including washing, rinsing, drying)?: A  Little Help from another person to put on and taking off regular upper body clothing?: None Help from another person to put on and taking off regular lower body clothing?: A Little 6 Click Score: 21   End of Session Nurse Communication: Other (comment)(O2 during ADLs)  Activity Tolerance: Patient tolerated treatment well Patient left: in bed;with call bell/phone within reach  OT Visit Diagnosis: Unsteadiness on feet (R26.81);Other abnormalities of gait and mobility (R26.89)                Time: [id number] OT Time Calculation (min): 20 min Charges:  OT General Charges $OT Visit: 1 Visit OT Evaluation $OT Eval Moderate Complexity: 1 Mod OT Treatments $Self Care/Home Management : 8-22 mins  Dessie Coma, M.S. OTR/L  04/15/20,  10:27 AM

## 2020-04-15 NOTE — Progress Notes (Signed)
Subjective: Was able to ambulate with assistance of walker and PT  Past Medical History:  Diagnosis Date  . Arthritis   . Basal cell carcinoma   . Chickenpox   . History of Clostridium difficile infection   . Hypertension   . MGUS (monoclonal gammopathy of unknown significance)   . Near syncope     Past Surgical History:  Procedure Laterality Date  . BASAL CELL CARCINOMA EXCISION    . HERNIA REPAIR      Family History  Problem Relation Age of Onset  . Arthritis Other   . Stroke Other   . Hypertension Other   . Leukemia Father   . CAD Mother   . Prostate cancer Neg Hx   . Bladder Cancer Neg Hx   . Kidney cancer Neg Hx     Social History:  reports that he has quit smoking. He has never used smokeless tobacco. He reports previous alcohol use. He reports that he does not use drugs.  No Known Allergies  Medications: Prior to Admission:  Medications Prior to Admission  Medication Sig Dispense Refill Last Dose  . dexamethasone 0.5 MG/5ML elixir TAKE 2 TEASPONFULS BY MOUTH 3 TIMES DAILY (Patient taking differently: Take 0.25 mg by mouth daily. ) 474 mL 0 05/01/2020 at Unknown time  . chlorproMAZINE (THORAZINE) 10 MG tablet Take 1 tablet (10 mg total) by mouth 3 (three) times daily as needed for hiccoughs. (Patient not taking: Reported on 05/06/2020) 30 tablet 0 Not Taking at Unknown time    Physical Examination: Blood pressure (!) 143/47, pulse 62, temperature 98.8 F (37.1 C), temperature source Oral, resp. rate 16, height 5\' 8"  (1.727 m), weight 61.2 kg, SpO2 98 %.   Neurological Examination   Mental Status: Alert, oriented, thought content appropriate.  Speech fluent without evidence of aphasia.  Able to follow 3 step commands without difficulty. Cranial Nerves: II: Discs flat bilaterally; Visual fields grossly normal, pupils equal, round, reactive to light and accommodation III,IV, VI: ptosis not present, extra-ocular motions intact bilaterally V,VII: smile symmetric,  facial light touch sensation normal bilaterally VIII: hearing normal bilaterally IX,X: gag reflex present XI: bilateral shoulder shrug XII: midline tongue extension Motor: Right : Upper extremity   5/5    Left:     Upper extremity   5/5  Lower extremity   4/5     Lower extremity   4/5 Tone and bulk:normal tone throughout; no atrophy noted Sensory: Pinprick and light touch intact throughout, bilaterally Deep Tendon Reflexes: 1+ and symmetric throughout     Laboratory Studies:   Basic Metabolic Panel: Recent Labs  Lab 05/08/2020 1641 04/15/20 0441  NA 129* 136  K 3.5 3.7  CL 93* 104  CO2 25 24  GLUCOSE 108* 91  BUN 15 12  CREATININE 0.93 0.73  CALCIUM 8.2* 7.6*    Liver Function Tests: Recent Labs  Lab 04/30/2020 1641  AST 164*  ALT 121*  ALKPHOS 61  BILITOT 2.0*  PROT 8.3*  ALBUMIN 3.5   No results for input(s): LIPASE, AMYLASE in the last 168 hours. Recent Labs  Lab 04/30/2020 2054  AMMONIA 10    CBC: Recent Labs  Lab 04/24/2020 1641  WBC 7.1  HGB 12.9*  HCT 38.1*  MCV 96.2  PLT 165    Cardiac Enzymes: Recent Labs  Lab 04/10/2020 1641  CKTOTAL 61    BNP: Invalid input(s): POCBNP  CBG: No results for input(s): GLUCAP in the last 168 hours.  Microbiology: Results for orders placed  or performed during the hospital encounter of 04/21/2020  Respiratory Panel by RT PCR (Flu A&B, Covid) - Nasopharyngeal Swab     Status: None   Collection Time: 04/25/2020  7:41 PM   Specimen: Nasopharyngeal Swab  Result Value Ref Range Status   SARS Coronavirus 2 by RT PCR NEGATIVE NEGATIVE Final    Comment: (NOTE) SARS-CoV-2 target nucleic acids are NOT DETECTED. The SARS-CoV-2 RNA is generally detectable in upper respiratoy specimens during the acute phase of infection. The lowest concentration of SARS-CoV-2 viral copies this assay can detect is 131 copies/mL. A negative result does not preclude SARS-Cov-2 infection and should not be used as the sole basis for  treatment or other patient management decisions. A negative result may occur with  improper specimen collection/handling, submission of specimen other than nasopharyngeal swab, presence of viral mutation(s) within the areas targeted by this assay, and inadequate number of viral copies (<131 copies/mL). A negative result must be combined with clinical observations, patient history, and epidemiological information. The expected result is Negative. Fact Sheet for Patients:  PinkCheek.be Fact Sheet for Healthcare Providers:  GravelBags.it This test is not yet ap proved or cleared by the [place] FDA and  has been authorized for detection and/or diagnosis of SARS-CoV-2 by FDA under an Emergency Use Authorization (EUA). This EUA will remain  in effect (meaning this test can be used) for the duration of the COVID-19 declaration under Section 564(b)(1) of the Act, 21 U.S.C. section 360bbb-3(b)(1), unless the authorization is terminated or revoked sooner.    Influenza A by PCR NEGATIVE NEGATIVE Final   Influenza B by PCR NEGATIVE NEGATIVE Final    Comment: (NOTE) The Xpert Xpress SARS-CoV-2/FLU/RSV assay is intended as an aid in  the diagnosis of influenza from Nasopharyngeal swab specimens and  should not be used as a sole basis for treatment. Nasal washings and  aspirates are unacceptable for Xpert Xpress SARS-CoV-2/FLU/RSV  testing. Fact Sheet for Patients: PinkCheek.be Fact Sheet for Healthcare Providers: GravelBags.it This test is not yet approved or cleared by the [place] FDA and  has been authorized for detection and/or diagnosis of SARS-CoV-2 by  FDA under an Emergency Use Authorization (EUA). This EUA will remain  in effect (meaning this test can be used) for the duration of the  Covid-19 declaration under Section 564(b)(1) of the Act, 21  U.S.C. section  360bbb-3(b)(1), unless the authorization is  terminated or revoked. Performed at [facility], [address] [place], [place] [address]     Coagulation Studies: Recent Labs    05/04/2020 03/09/2053  LABPROT 11.7  INR 0.9    Urinalysis:  Recent Labs  Lab 04/10/2020 1641  COLORURINE YELLOW*  LABSPEC 1.008  PHURINE 6.0  GLUCOSEU NEGATIVE  HGBUR SMALL*  BILIRUBINUR NEGATIVE  KETONESUR 5*  PROTEINUR NEGATIVE  NITRITE NEGATIVE  LEUKOCYTESUR NEGATIVE    Lipid Panel:     Component Value Date/Time   CHOL 234 (H) 04/14/2020 0609   TRIG 72 04/14/2020 0609   HDL 128 04/14/2020 0609   CHOLHDL 1.8 04/14/2020 0609   VLDL 14 04/14/2020 0609   LDLCALC 92 04/14/2020 0609    HgbA1C:  Lab Results  Component Value Date   HGBA1C 5.6 04/14/2020    Urine Drug Screen:      Component Value Date/Time   LABOPIA NONE DETECTED 04/19/2020 Mar 09, 2053   COCAINSCRNUR NONE DETECTED 05/01/2020 03/09/53   LABBENZ NONE DETECTED 04/19/2020 03/09/53   AMPHETMU NONE DETECTED 05/01/2020 03/09/53   THCU NONE DETECTED  04/26/2020 2054   LABBARB NONE DETECTED 04/09/2020 2054    Alcohol Level:  Recent Labs  Lab 05/01/2020 2054  ETH <10    Other results: EKG: normal EKG, normal sinus rhythm, unchanged from previous tracings.  Imaging: CT Head Wo Contrast  Result Date: 04/15/2020 CLINICAL DATA:  Balance difficulty EXAM: CT HEAD WITHOUT CONTRAST TECHNIQUE: Contiguous axial images were obtained from the base of the skull through the vertex without intravenous contrast. COMPARISON:  CT 12/16/2019, MRI 5 /10/2019 FINDINGS: Brain: No acute territorial infarction or hemorrhage is visualized. 16 mm slightly dense mass at the left foramen of Monro, consistent with colloid cysts, without significant change. Moderate atrophy. Stable ventricular prominence. Patchy white matter hypodensity consistent with chronic small vessel ischemic change. Vascular: No hyperdense vessel.  Carotid vascular calcification Skull: Normal.  Negative for fracture or focal lesion. Sinuses/Orbits: Mucosal thickening in the left maxillary sinus Other: None IMPRESSION: 1. No CT evidence for acute intracranial abnormality 2. 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 3. Atrophy and chronic small vessel ischemic change of the white matter Electronically Signed   By: Donavan Foil M.D.   On: 04/09/2020 20:49   MR BRAIN WO CONTRAST  Result Date: 04/14/2020 CLINICAL DATA:  Initial evaluation for acute ataxia, dizziness, leg weakness. EXAM: MRI HEAD WITHOUT CONTRAST TECHNIQUE: Multiplanar, multiecho pulse sequences of the brain and surrounding structures were obtained without intravenous contrast. COMPARISON:  Comparison made with prior CT from earlier same day as well as previous brain MRI from 04/20/2019. FINDINGS: Brain: Diffuse prominence of the CSF containing spaces compatible generalized age-related cerebral atrophy. Mild patchy T2/FLAIR hyperintensity within the periventricular deep white matter both cerebral hemispheres most consistent with chronic small vessel ischemic disease, mild for age. No abnormal foci of restricted diffusion to suggest acute or subacute ischemia. Gray-white matter differentiation maintained. No encephalomalacia to suggest chronic cortical infarction. No foci of susceptibility artifact to suggest acute or chronic intracranial hemorrhage. Colloid cyst positioned at the left aspect of the foramen of Monro again seen, relatively stable in size measuring 16 x 10 x 10 mm. Mild ventriculomegaly is unchanged, most likely related to underlying parenchymal volume loss. No evidence for obstructive hydrocephalus. No other mass lesion, mass effect, or midline shift. No extra-axial fluid collection. Small DVA noted at the left cerebellum, stable. Pituitary gland and suprasellar region normal. Midline structures intact. Vascular: Major intracranial vascular flow voids are maintained. Skull and upper cervical spine: Craniocervical  junction within normal limits. Bone marrow signal intensity normal. No scalp soft tissue abnormality. Sinuses/Orbits: Globes and orbital soft tissues within normal limits. Mild-to-moderate mucosal thickening noted within the left maxillary sinus, likely allergic/inflammatory in nature. Paranasal sinuses are otherwise clear. No mastoid effusion. Inner ear structures grossly normal. Other: None. IMPRESSION: 1. No acute intracranial abnormality. 2. Colloid cyst positioned at the left foramen of Monro, stable from previous. Stable ventricular size without evidence for obstructive hydrocephalus. 3. Underlying age-related cerebral atrophy with mild chronic small vessel ischemic disease. Electronically Signed   By: Jeannine Boga M.D.   On: 04/14/2020 01:39   US Carotid Bilateral (at [facility] only)  Result Date: 04/14/2020 CLINICAL DATA:  TIA. EXAM: BILATERAL CAROTID DUPLEX ULTRASOUND TECHNIQUE: Pearline Cables scale imaging, color Doppler and duplex ultrasound were performed of bilateral carotid and vertebral arteries in the neck. COMPARISON:  None. FINDINGS: Criteria: Quantification of carotid stenosis is based on velocity parameters that correlate the residual internal carotid diameter with NASCET-based stenosis levels, using the diameter of the distal internal  carotid lumen as the denominator for stenosis measurement. The following velocity measurements were obtained: RIGHT ICA: 87/22 cm/sec CCA: 123456 cm/sec SYSTOLIC ICA/CCA RATIO:  1.5 ECA: 108 cm/sec LEFT ICA: 98/27 cm/sec CCA: 0000000 cm/sec SYSTOLIC ICA/CCA RATIO:  1.5 ECA: 120 cm/sec RIGHT CAROTID ARTERY: Echogenic calcified plaque at the right carotid bulb. External carotid artery is patent with normal waveform. Normal waveforms and velocities in the internal carotid artery. RIGHT VERTEBRAL ARTERY: Antegrade flow and normal waveform in the right vertebral artery. LEFT CAROTID ARTERY: Echogenic calcified plaque at the left carotid bulb. External carotid artery is  patent with normal waveform. Normal waveforms and velocities in the internal carotid artery. LEFT VERTEBRAL ARTERY: Antegrade flow and normal waveform in the left vertebral artery. IMPRESSION: 1. Atherosclerotic plaque at the carotid bulbs bilaterally. Estimated degree of stenosis in the internal carotid arteries is less than 50% bilaterally. 2. Patent vertebral arteries with antegrade flow. Electronically Signed   By: Markus Daft M.D.   On: 04/14/2020 10:31   ECHOCARDIOGRAM COMPLETE  Result Date: 04/14/2020    ECHOCARDIOGRAM REPORT   Patient Name:   KYSON CICHOWSKI Date of Exam: 04/14/2020 Medical Rec #:  [id number]      Height:       68.0 in Accession #:    [id number]     Weight:       135.0 lb Date of Birth:  06/03/1941     BSA:          1.729 m Patient Age:    79 years       BP:           154/76 mmHg Patient Gender: M              HR:           98 bpm. Exam Location:  [facility] Procedure: 2D Echo, Cardiac Doppler and Color Doppler Indications:     TIA 435.9  History:         Patient has prior history of Echocardiogram examinations, most                  recent 08/28/2016. Risk Factors:Hypertension. Near syncope.  Sonographer:     Sherrie Sport RDCS (AE) Referring Phys:  [id number] [place] Diagnosing Phys: Serafina Royals MD IMPRESSIONS  1. Left ventricular ejection fraction, by estimation, is 55 to 60%. The left ventricle has normal function. The left ventricle has no regional wall motion abnormalities. Left ventricular diastolic parameters were normal.  2. Right ventricular systolic function is normal. The right ventricular size is normal. There is normal pulmonary artery systolic pressure.  3. The mitral valve is normal in structure. Trivial mitral valve regurgitation.  4. The aortic valve is normal in structure. Aortic valve regurgitation is not visualized. FINDINGS  Left Ventricle: Left ventricular ejection fraction, by estimation, is 55 to 60%. The left ventricle has normal function. The left ventricle has no  regional wall motion abnormalities. The left ventricular internal cavity size was normal in size. There is  no left ventricular hypertrophy. Left ventricular diastolic parameters were normal. Right Ventricle: The right ventricular size is normal. No increase in right ventricular wall thickness. Right ventricular systolic function is normal. There is normal pulmonary artery systolic pressure. The tricuspid regurgitant velocity is 1.48 m/s, and  with an assumed right atrial pressure of 10 mmHg, the estimated right ventricular systolic pressure is 123456 mmHg. Left Atrium: Left atrial size was normal in size. Right Atrium: Right atrial size was normal in  size. Pericardium: There is no evidence of pericardial effusion. Mitral Valve: The mitral valve is normal in structure. Trivial mitral valve regurgitation. Tricuspid Valve: The tricuspid valve is normal in structure. Tricuspid valve regurgitation is trivial. Aortic Valve: The aortic valve is normal in structure. Aortic valve regurgitation is not visualized. Aortic valve mean gradient measures 2.0 mmHg. Aortic valve peak gradient measures 4.2 mmHg. Aortic valve area, by VTI measures 2.75 cm. Pulmonic Valve: The pulmonic valve was normal in structure. Pulmonic valve regurgitation is not visualized. Aorta: The aortic root and ascending aorta are structurally normal, with no evidence of dilitation. IAS/Shunts: No atrial level shunt detected by color flow Doppler.  LEFT VENTRICLE PLAX 2D LVIDd:         3.92 cm  Diastology LVIDs:         2.60 cm  LV e' lateral:   5.55 cm/s LV PW:         1.02 cm  LV E/e' lateral: 11.2 LV IVS:        1.06 cm  LV e' medial:    5.55 cm/s LVOT diam:     2.00 cm  LV E/e' medial:  11.2 LV SV:         47 LV SV Index:   27 LVOT Area:     3.14 cm  RIGHT VENTRICLE RV Basal diam:  3.38 cm RV S prime:     21.40 cm/s TAPSE (M-mode): 4.0 cm LEFT ATRIUM           Index       RIGHT ATRIUM           Index LA diam:      3.00 cm 1.73 cm/m  RA Area:     12.70  cm LA Vol (A2C): 33.6 ml 19.43 ml/m RA Volume:   26.10 ml  15.09 ml/m LA Vol (A4C): 51.1 ml 29.55 ml/m  AORTIC VALVE                   PULMONIC VALVE AV Area (Vmax):    2.58 cm    PV Vmax:        0.79 m/s AV Area (Vmean):   2.34 cm    PV Peak grad:   2.5 mmHg AV Area (VTI):     2.75 cm    RVOT Peak grad: 4 mmHg AV Vmax:           102.55 cm/s AV Vmean:          64.500 cm/s AV VTI:            0.172 m AV Peak Grad:      4.2 mmHg AV Mean Grad:      2.0 mmHg LVOT Vmax:         84.20 cm/s LVOT Vmean:        48.100 cm/s LVOT VTI:          0.150 m LVOT/AV VTI ratio: 0.87  AORTA Ao Root diam: 2.90 cm MITRAL VALVE               TRICUSPID VALVE MV Area (PHT): 2.62 cm    TR Peak grad:   8.8 mmHg MV Decel Time: 289 msec    TR Vmax:        148.00 cm/s MV E velocity: 62.10 cm/s MV A velocity: 85.90 cm/s  SHUNTS MV E/A ratio:  0.72        Systemic VTI:  0.15 m  Systemic Diam: 2.00 cm Serafina Royals MD Electronically signed by Serafina Royals MD Signature Date/Time: 04/14/2020/1:42:34 PM    Final      Assessment/Plan:  79 y.o. male with a known history of hypertension, osteoarthritis, alcohol use and C. difficile colitis, who presented to the emergency room with acute onset of ataxia and inability to ambulate that has been progressing over last month to few weeks.  He had a fall in January and a couple weeks ago.  Today he had to use his walker due to significant weakness.  He drinks a couple of beer per night not on a regular basis.  He lives by himself.  He denies any paresthesias or other focal muscle weakness.  No nausea or vomiting or diarrhea or abdominal pain.  No headache or dizziness or blurred vision.  Patient was noted to have intention tremors in the ER and when he stood up to walk he was unsteady and needed 2 person assist with a shuffling like gait  - MRI no acute abnormalities - Sodium improved to 136 from 129 - symptoms likely metabolic related in setting of electrolyte  imbalance - B12 normal - I don't think this is Parkinson's given the lack of tremor and tone is not abnormal without any cog-wheel rigidity - Thiamine 500 IV for 1 more day today after which he can go back on 100 PO daily  - Folate - Pt/ot appreciated - likely stay overnight and will have assistance from pt's son starting tomorrow as he arrives from [place] 04/15/2020, 11:24 AM

## 2020-04-15 NOTE — Progress Notes (Signed)
Initial Nutrition Assessment  DOCUMENTATION CODES:   Severe malnutrition in context of social or environmental circumstances  INTERVENTION:  Provide Ensure Enlive po BID, each supplement provides 350 kcal and 20 grams of protein.   Provide vitamin C 500 mg po daily. Patient is at risk for vitamin C deficiency.  Continue MVI po daily and folic acid 1 mg po daily. Now that patient has finished high-dose IV thiamine recommend providing thiamine 100 mg po daily.  Encouraged adequate intake of calories and protein at meals.  NUTRITION DIAGNOSIS:   Severe Malnutrition related to social / environmental circumstances(inadequate oral intake, EtOH use) as evidenced by severe fat depletion, moderate muscle depletion, severe muscle depletion.  GOAL:   Patient will meet greater than or equal to 90% of their needs  MONITOR:   PO intake, Supplement acceptance, Labs, Weight trends, Skin, I & O's  REASON FOR ASSESSMENT:   Malnutrition Screening Tool    ASSESSMENT:   79 year old male with PMHx of HTN, osteoarthritis, hx C difficile colitis, EtOH use who is admitted with ataxia and recent recurrent falls, elevated LFTs due to alcoholic hepatitis.    Met with patient at bedside. He reports he has had a decreased appetite and intake for several weeks. He typically eats 2 meals per day as he skips breakfast. At lunch he typically has a sandwich with potato salad and chips. Dinner is usually fish or beef. He reports he does not eat any fruit and eats very small amounts of vegetables. Patient is at risk for vitamin C deficiency in setting of EtOH use and low intake of dietary vitamin C. Patient is amenable to drinking Ensure to help meet calorie/protein needs.  Patient reports his UBW was 140 lbs and that he has lost approximately 5 lbs recently related to decreased intake. According to chart patient was 64.3 kg on 06/04/2019. He is currently documented to be 61.2 kg (135 lbs). That is insignificant  weight loss over the past year.   Medications reviewed and include: Decadron 5.64 mg daily, folic acid 1 mg daily, MVI daily, NS at 100 mL/hr. Patient is s/p high-dose IV thiamine.  Labs reviewed.  NUTRITION - FOCUSED PHYSICAL EXAM:    Most Recent Value  Orbital Region  Severe depletion  Upper Arm Region  Severe depletion  Thoracic and Lumbar Region  Moderate depletion  Buccal Region  Severe depletion  Temple Region  Severe depletion  Clavicle Bone Region  Severe depletion  Clavicle and Acromion Bone Region  Moderate depletion  Scapular Bone Region  Moderate depletion  Dorsal Hand  Severe depletion  Patellar Region  Moderate depletion  Anterior Thigh Region  Moderate depletion  Posterior Calf Region  Severe depletion  Edema (RD Assessment)  None  Hair  Reviewed  Eyes  Reviewed  Mouth  Reviewed  Skin  Reviewed  Nails  Reviewed     Diet Order:   Diet Order            Diet regular Room service appropriate? Yes with Assist; Fluid consistency: Thin; Fluid restriction: 1500 mL Fluid  Diet effective now             EDUCATION NEEDS:   Education needs have been addressed  Skin:  Skin Assessment: Reviewed RN Assessment(ecchymosis)  Last BM:  04/15/2020 small type 7  Height:   Ht Readings from Last 1 Encounters:  05/01/2020 5' 8"  (1.727 m)   Weight:   Wt Readings from Last 1 Encounters:  04/19/2020 61.2 kg  Ideal Body Weight:  70 kg  BMI:  Body mass index is 20.53 kg/m.  Estimated Nutritional Needs:   Kcal:  1700-1900  Protein:  85-95 grams  Fluid:  1.7-19 L/day  Jacklynn Barnacle, MS, RD, LDN Pager number available on Amion

## 2020-04-15 NOTE — Progress Notes (Signed)
Physical Therapy Treatment Patient Details Name: Henry Mays MRN: [id number] DOB: [birth year] Today's Date: 04/15/2020    History of Present Illness Henry Mays is a 84yoM who comes to [facility] on 5/5 c BLE weakness and unsteadiness. Pt reports this  began about 2 weeks ago, insidious onset, severe general weakness in BLE, no focal strenght loss or paresthesias. Pt reports prior to 2 weeks ago he would walk a few tiems weekly for exercise outside without device, now he is unable to AMB without using a RW. PMH: HTN, OA< ETOH abuse, C Diff collitis. Imaging thus far unremarkable. Seen by neurology already.    PT Comments    Pt in bed upon entry, finishing up with vascular RN who is placing new IV access. PT agreeable to participate. Pt reports no major developments since previous day. Pt moving somewhat better in bed mobility and transfers, but still very weak and reliance on BUE strength. Pt assisted to [facility] with mostly urine output and gas of bowel, but very minimal liquid bowel contribution- RN made aware. Pt able to AMB around the unit with consistent energy level and performance. Pt still very unsteady and with low confidence when unsupported, but is able to be safe and more confident with RW. PT AMB >383ft. Pt still nervous about return to home alone, but asks if he can stay overnight, as his son from [place] can be here tomorrow to help at home. MD/Neuro made aware. HR WNL during gait, SpO2 fluctuating rapidly from 70s to 90s with poor signal noted on waveform, cold fingers, no clear correlation with with activity or pt presentation- pt had no breathing complaints in session. Author suspects sensor signal error with SpO2. Pt progressing well in independence, still remains far off baseline.    Follow Up Recommendations  Home health PT;Supervision - Intermittent     Equipment Recommendations  None recommended by PT    Recommendations for Other Services       Precautions / Restrictions  Precautions Precautions: Fall Restrictions Weight Bearing Restrictions: No    Mobility  Bed Mobility Overal bed mobility: Needs Assistance Bed Mobility: Supine to Sit     Supine to sit: Modified independent (Device/Increase time) Sit to supine: HOB elevated(Increased time, HOB elevated, and VCs for rest breaks/PLB)   General bed mobility comments: improved effort since 1DA  Transfers Overall transfer level: Modified independent Equipment used: Rolling walker (2 wheeled)             General transfer comment: Improved effort since 1DA, heavy UE use required, but able to perform with improved confidence. From EOB, BSC, and recliner in session.  Ambulation/Gait Ambulation/Gait assistance: Modified independent (Device/Increase time) Gait Distance (Feet): 380 Feet Assistive device: Rolling walker (2 wheeled) Gait Pattern/deviations: WFL(Within Functional Limits) Gait velocity: 1.47m/s   General Gait Details: shuffle less evident by 50% since yesterday   Stairs             Wheelchair Mobility    Modified Rankin (Stroke Patients Only)       Balance Overall balance assessment: History of Falls;Modified Independent                                          Cognition Arousal/Alertness: Awake/alert Behavior During Therapy: WFL for tasks assessed/performed Overall Cognitive Status: Within Functional Limits for tasks assessed  Exercises Other Exercises Other Exercises: Pt educated re: falls prevention, OT role, energy conservation, IS (provided) frequency and use, home/routines modifications Other Exercises: Tooth brushing, don B socks, bed mobility, sup<>sit, IS,     General Comments General comments (skin integrity, edema, etc.): Seated EOB donning B socks: SpO2 86% on RA, HR 102 - resolved to 98% on RA c seated rest break and PLB       Pertinent Vitals/Pain Pain Assessment: No/denies  pain    Home Living Family/patient expects to be discharged to:: Private residence(village at [place]) Living Arrangements: Alone Available Help at Discharge: (2 sons ([place] Type of Home: ( [place] in [place]) Home Access: Level entry   Home Layout: One level Home Equipment: Environmental consultant - 2 wheels;Grab bars - toilet;Grab bars - tub/shower      Prior Function Level of Independence: Independent with assistive device(s)      Comments: independent in ADL/IADL   PT Goals (current goals can now be found in the care plan section) Acute Rehab PT Goals Patient Stated Goal: Regain baseline strength in legs PT Goal Formulation: With patient Time For Goal Achievement: 04/28/20 Potential to Achieve Goals: Good Progress towards PT goals: Progressing toward goals    Frequency    7X/week      PT Plan Current plan remains appropriate    Co-evaluation              AM-PAC PT "6 Clicks" Mobility   Outcome Measure  Help needed turning from your back to your side while in a flat bed without using bedrails?: None Help needed moving from lying on your back to sitting on the side of a flat bed without using bedrails?: None Help needed moving to and from a bed to a chair (including a wheelchair)?: None Help needed standing up from a chair using your arms (e.g., wheelchair or bedside chair)?: None Help needed to walk in hospital room?: A Little Help needed climbing 3-5 steps with a railing? : A Little 6 Click Score: 22    End of Session Equipment Utilized During Treatment: Gait belt Activity Tolerance: Patient tolerated treatment well;No increased pain Patient left: in chair;with call bell/phone within reach Nurse Communication: Mobility status PT Visit Diagnosis: Unsteadiness on feet (R26.81);Difficulty in walking, not elsewhere classified (R26.2);Other abnormalities of gait and mobility (R26.89)     Time: 1050-1130 PT Time Calculation (min) (ACUTE ONLY): 40  min  Charges:  $Therapeutic Exercise: 8-22 mins $Therapeutic Activity: 23-37 mins                     1:49 PM, 04/15/20 Etta Grandchild, PT, DPT Physical Therapist - [facility]  [phone] ([place])    Jamecia Lerman C 04/15/2020, 1:40 PM

## 2020-04-15 NOTE — Progress Notes (Signed)
PROGRESS NOTE  Henry Mays [id number] DOB: 10/26/1941 DOA: 04/25/2020 PCP: Leone Haven, MD  Brief History   Henry Mays  is a 79 y.o. Caucasian male with a known history of hypertension, osteoarthritis, alcohol abuse and C. difficile colitis, who presented to the emergency room with acute onset of ataxia and inability to ambulate.  He had a fall in January and a couple weeks ago.  Today he had to use his walker without he was feeling significantly weak in both legs and unable to ambulate.  He drinks a couple of beer per night not on a regular basis.  He lives by himself.  He denies any paresthesias or other focal muscle weakness.  No nausea or vomiting or diarrhea or abdominal pain.  No headache or dizziness or blurred vision.  Patient was noted to have intention tremors in the ER and when he stood up to walk he was unsteady and needed 2 person assist with a shuffling like gait.  Upon presentation to the emergency room, temperature was 99.4 and heart rate 103 with otherwise normal vital signs.  Blood pressure later on was 175/97 and later improved.  Labs revealed hyponatremia 129 with hypochloremia 93 and borderline potassium of 3.5.  AST was 164 and ALT 121 with total protein of 8.3 with albumin of 3.5.  Indirect bili was 1.6 and total bili 2 with direct bili of 0.4 and ammonia level 10.  CBC showed mild anemia.  COVID-19 PCR and influenza antigens came back negative.  Urine drug screen came back negative.  Alcohol levels less than 10.  Noncontrasted head CT scan revealed no acute intracranial abnormalities.  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 EKG showed normal sinus rhythm with a rate of 94 with slightly poor R wave progression.  The patient was given 500 mL IV normal saline bolus as well as IV thiamine and placed on CIWA protocol.  The patient will be admitted to an observation medical  monitored bed for further evaluation and management.  The patient has been evaluated by neurology. They have recommended thiamine 500 mg IV x 2 days followed by PT as outpatient.  The patient was evaluated by PT. He was found to be hypoxic with ambulation. Although oximeter was not completely reliable, he did drop into the 70's. He was able to ambulate quite far, but was not confident or sure with ambulation.  Consultants  . Neurology  Procedures  . None  Antibiotics   Anti-infectives (From admission, onward)   None     Subjective  The patient is resting quietly. No new complaints.  Objective   Vitals:  Vitals:   04/15/20 0630 04/15/20 0800  BP: 126/65 (!) 143/47  Pulse: 63 62  Resp: 17 16  Temp:  98.8 F (37.1 C)  SpO2: 99% 98%   Exam:  Constitutional:  . The patient is awake, alert, and oriented x 3. No acute distress. Respiratory:  . No increased work of breathing. . No wheezes, rales, or rhonchi . No tactile fremitus Cardiovascular:  . Regular rate and rhythm . No murmurs, ectopy, or gallups. . No lateral PMI. No thrills. Abdomen:  . Abdomen is soft, non-tender, non-distended . No hernias, masses, or organomegaly . Normoactive bowel sounds.  Musculoskeletal:  . No cyanosis, clubbing, or edema Skin:  . No rashes, lesions, ulcers . palpation of skin: no induration or nodules Neurologic:  . CN 2-12 intact .  Sensation all 4 extremities intact . Intention tremor present Psychiatric:  . Mental status o Mood: depressed o Affect: flat o Orientation to person, place, time  . judgment and insight appear intact  I have personally reviewed the following:   Today's Data  . Vitals, Lipid panel.  Imaging  . MRI brain: Atrophy and chronic smaall vessel disease. Colloid cyst at left foramen of Monro. Stable. No sign of obstructive hydrocephalus. BL carotid doppler: Atherosclerotic plaque at the carotid bulbs bilaterally. Estimated degree of stenosis in the  internal carotid arteries is less than 50% bilaterally. Patent vertebral arteries with antegrade flow. .   Cardiology Data  . Echocardiogram: EF of 55-60%. Normal LV function. No regional wall motion abnormalities. Normal diastolic parameters. RV systolic function is normal. Normal PASP. No atrial level shunts. No intracardiac source of thrombus documented.  CXR: Scarring was seen at the left base with a questionable but minimal left pleural effusions.   Scheduled Meds: .  stroke: mapping our early stages of recovery book   Does not apply Once  . aspirin EC  81 mg Oral Daily  . dexamethasone  0.25 mg Oral Daily  . enoxaparin (LOVENOX) injection  40 mg Subcutaneous Q24H  . folic acid  1 mg Oral Daily  . Ipratropium-Albuterol  1 puff Inhalation QID  . multivitamin with minerals  1 tablet Oral Daily   Continuous Infusions: . sodium chloride 100 mL/hr at 04/15/20 [id number]    Active Problems:   Ataxia   Unable to walk   LOS: 1 day   A & P  Ataxia with unsteady gait and recent recurrent falls and inability to ambulate: CVA ruled out. Neurology was consulted and has ruled out parkinsons. He has suggested possibly Wernickes, and has recommended 2 days of high dose (500mg ) IV thiamine. This has been started. He also feels that electrolyte abnormalities are playing a role. These will continue to be addressed and evaluated. The patient will be evaluated by PT/OT and will need to continue therapy after he leaves the hospital. Continue daily aspirin. Will also add simvastatin.  Alcohol abuse with elevated LFTs due to alcoholic hepatitis: Counsel alcohol rehab and cessation of use. Continue supplementation with folic acid, vitamins and high dose IV thiamine as recommended by neurology. As needed ativan is available for symptoms of withdrawal.  Mild hyponatremia and hypochloremia: Likely due to ETOH use. Will place on fluid restriction. Monitor..  Hypertensive urgency: Blood pressures remain  elevated. No CVA, so need for permissive hypertension. Will add Norvasc and as needed hydralazine.  Hypoxia with activity: Likely chronic. Will recheck ambulatory SaO2 in the morning. I believe that the patient will need Oxygen for discharge.  I have seen and examined this patient myself. I have spent 32 minutes in his evaluation and care.  DVT prophylaxis: Lovenox CODE STATUS: Full Code Family Communication: None available. Disposition: Patient is from home. Disposition at discharge anticipated to be to home with home health PT as recommended by PT. Barriers to discharge: Hypoxia with ambulation. Unknown true oxygen requirements for discharge.   Elease Swarm, DO [facility] Direct contact: see [email or web address]  7PM-7AM contact night coverage as above 04/15/2020, 2:13 PM  LOS: 0 days

## 2020-04-16 DIAGNOSIS — F04 Amnestic disorder due to known physiological condition: Secondary | ICD-10-CM

## 2020-04-16 DIAGNOSIS — E538 Deficiency of other specified B group vitamins: Secondary | ICD-10-CM

## 2020-04-16 DIAGNOSIS — F10230 Alcohol dependence with withdrawal, uncomplicated: Secondary | ICD-10-CM

## 2020-04-16 MED ORDER — ENSURE ENLIVE PO LIQD: 237.0000 mL | Freq: Two times a day (BID) | ORAL | 12 refills | Status: AC

## 2020-04-16 MED ORDER — IPRATROPIUM-ALBUTEROL 0.5-2.5 (3) MG/3ML IN SOLN: 3.0000 mL | Freq: Four times a day (QID) | RESPIRATORY_TRACT | Status: DC | PRN

## 2020-04-16 MED ORDER — ASCORBIC ACID 500 MG PO TABS: 500.0000 mg | ORAL_TABLET | Freq: Every day | ORAL | 0 refills | Status: AC

## 2020-04-16 MED ORDER — COMBIVENT RESPIMAT 20-100 MCG/ACT IN AERS: 1.0000 | INHALATION_SPRAY | Freq: Four times a day (QID) | RESPIRATORY_TRACT | 0 refills | Status: AC

## 2020-04-16 NOTE — Progress Notes (Signed)
After a lengthy telephone call between the pt, the pt's son, Aaron Edelman, Dr Benny Lennert, Y. Cross RN and myself; it was determined at this time that it is not safe for the pt to go home; possible rehab placement will be looked at; the discharge is canceled and the pt will not have to have a saline lock; all agreeable at this point

## 2020-04-16 NOTE — Progress Notes (Signed)
MD order received in [facility] to discharge pt home with home health; TOC previously established Home Health PT and OT with [place] at [facility]; verbally reviewed AVS with pt, no questions voiced at this time; pt's discharge pending arrival of his son who is driving in from [place] to pick him up

## 2020-04-16 NOTE — Progress Notes (Signed)
Changed Duoneb from QID to Q6 prn @ pts request. BS are clear & pt is not SOB.

## 2020-04-16 NOTE — Progress Notes (Signed)
Physical Therapy Treatment Patient Details Name: Henry Mays MRN: [id number] DOB: 07-19-1941 Today's Date: 04/16/2020    History of Present Illness Henry Mays is a 22yoM who comes to [facility] on 5/5 c BLE weakness and unsteadiness. Pt reports this  began about 2 weeks ago, insidious onset, severe general weakness in BLE, no focal strenght loss or paresthesias. Pt reports prior to 2 weeks ago he would walk a few tiems weekly for exercise outside without device, now he is unable to AMB without using a RW. PMH: HTN, OA< ETOH abuse, C Diff collitis. Imaging thus far unremarkable. Seen by neurology already.    PT Comments    Pt was supine in bed upon arriving. He agrees to PT session and is motivated and cooperative throughout. Pt was on RM air throughout session.  He requested to get to [facility] for BM. Pt was able to exit R side of bed with increased time. He did use bed rail and HOB elevated but did not require physical assistance. Slight tremors upon sitting up but pt reports its from feel cold. BP 167/72 prior to getting OOB. BP after ambulation 161/89. Pt did have successful BM prior to standing and ambulating in hallway. CGA for safety progressing to supervision with ambulation 200 ft with RW. MD arrived during session and plans to DC pt home with HHPT to follow. He is progressing well with PT and reports he will have a son coming today to stay with him for awhile. At conclusion of session, pt is supine in bed with HOB elevated ~ 20 degrees and bed alarm in place.    Follow Up Recommendations  Home health PT;Supervision - Intermittent     Equipment Recommendations  None recommended by PT    Recommendations for Other Services       Precautions / Restrictions Precautions Precautions: Fall Restrictions Weight Bearing Restrictions: No    Mobility  Bed Mobility Overal bed mobility: Needs Assistance Bed Mobility: Supine to Sit     Supine to sit: Supervision Sit to supine: HOB  elevated;Supervision   General bed mobility comments: Pt was able to exit and re-enter bed without physical assistance however HOB was elevated and pt did use bed rail.  Transfers Overall transfer level: Modified independent Equipment used: Rolling walker (2 wheeled)             General transfer comment: Pt was able to demonstrate safe ability to STS from EOB and BSC. Vcs only for improved technique. no physical assist required  Ambulation/Gait Ambulation/Gait assistance: Supervision;Min guard Gait Distance (Feet): 200 Feet Assistive device: Rolling walker (2 wheeled) Gait Pattern/deviations: Narrow base of support;Step-through pattern(decreased step length) Gait velocity: WNL   General Gait Details: pt has no LOB or unsteadiness with ambulation 200 ft with RW. sao2 stable and > 90 % throughout. he did have one episode of de-sat to 53 however therapist questions if accurate read. no C/O lighthead/dizziness   Marine scientist Rankin (Stroke Patients Only)       Balance                                            Cognition Arousal/Alertness: Awake/alert Behavior During Therapy: WFL for tasks assessed/performed Overall Cognitive Status: Within Functional Limits for tasks assessed  General Comments: Pt is A and O x 4 and motivated. Follows commands well with good carry-over      Exercises      General Comments        Pertinent Vitals/Pain Pain Assessment: No/denies pain    Home Living                      Prior Function            PT Goals (current goals can now be found in the care plan section) Acute Rehab PT Goals Patient Stated Goal: Regain baseline strength in legs Progress towards PT goals: Progressing toward goals    Frequency    7X/week      PT Plan Current plan remains appropriate    Co-evaluation              AM-PAC  PT "6 Clicks" Mobility   Outcome Measure  Help needed turning from your back to your side while in a flat bed without using bedrails?: None Help needed moving from lying on your back to sitting on the side of a flat bed without using bedrails?: None Help needed moving to and from a bed to a chair (including a wheelchair)?: None Help needed standing up from a chair using your arms (e.g., wheelchair or bedside chair)?: None Help needed to walk in hospital room?: A Little Help needed climbing 3-5 steps with a railing? : A Little 6 Click Score: 22    End of Session Equipment Utilized During Treatment: Gait belt Activity Tolerance: Patient tolerated treatment well;No increased pain Patient left: in bed;with call bell/phone within reach;with bed alarm set Nurse Communication: Mobility status PT Visit Diagnosis: Unsteadiness on feet (R26.81);Difficulty in walking, not elsewhere classified (R26.2);Other abnormalities of gait and mobility (R26.89)     Time: [id number] PT Time Calculation (min) (ACUTE ONLY): 23 min  Charges:  $Gait Training: 8-22 mins $Therapeutic Activity: 8-22 mins                     Julaine Fusi PTA 04/16/20, 10:11 AM

## 2020-04-16 NOTE — TOC Transition Note (Signed)
Transition of Care [facility]) - CM/SW Discharge Note   Patient Details  Name: Safari Walsingham MRN: [id number] Date of Birth: [birth year]  Transition of Care [facility]) CM/SW Contact:  Boris Sharper, LCSW Phone Number: 04/16/2020, 1:09 PM   Clinical Narrative:    Pt is medically stable for discharge per MD. Pt will retrun to [facility] and will be transported by his son. CSW notified pt of PT and OT recommendations and he was agreeable to [facility] services. He did not have a preference in [facility] agencies. CSW contacted Corene Cornea with [facility] and they were able to accept. [facility] follow for [facility] OT and PT.    Final next level of care: Home w Home Health Services Barriers to Discharge: No Barriers Identified   Patient Goals and CMS Choice   CMS Medicare.gov Compare Post Acute Care list provided to:: Patient Choice offered to / list presented to : Patient  Discharge Placement                       Discharge Plan and Services     Post Acute Care Choice: Home Health                    HH Arranged: PT, OT [facility] Agency: [place] ([facility]) Date [facility] Agency Contacted: 04/16/20 Time [place]: 1307 Representative spoke with at [place]: [place] ([place]) Interventions     Readmission Risk Interventions No flowsheet data found.

## 2020-04-16 NOTE — Discharge Summary (Addendum)
Physician Discharge Summary  Henry Mays [id number] DOB: [birth year] DOA: 04/29/2020  PCP: Leone Haven, MD  Admit date: 04/16/2020 Discharge date: 04/18/2020  Recommendations for Outpatient Follow-up:  Discharge to home to care of son with home health PT/OT Follow up with PCP in 7-10 days. Follow up with outpatient alcohol rehab program.   Discharge Diagnoses: Principal diagnosis is #1 1. Ataxic gait, frequenf falles, ambulatory dysfunction 2. Alcohol abuse 3. Hyponatremia 4. Hypochloremia 5. Hypertensive urgency 6. Low thiamine 7. Debility 8. Severe malnutrition  Discharge Condition: Fair  Disposition: To home to care of son  Diet recommendation: Heart healthy  Filed Weights   04/16/2020 1609  Weight: 61.2 kg   History of present illness:  Henry Mays  is a 79 y.o. Caucasian male with a known history of hypertension, osteoarthritis, alcohol abuse and C. difficile colitis, who presented to the emergency room with acute onset of ataxia and inability to ambulate.  He had a fall in January and a couple weeks ago.  Today he had to use his walker without he was feeling significantly weak in both legs and unable to ambulate.  He drinks a couple of beer per night not on a regular basis.  He lives by himself.  He denies any paresthesias or other focal muscle weakness.  No nausea or vomiting or diarrhea or abdominal pain.  No headache or dizziness or blurred vision.  Patient was noted to have intention tremors in the ER and when he stood up to walk he was unsteady and needed 2 person assist with a shuffling like gait.   Upon presentation to the emergency room, temperature was 99.4 and heart rate 103 with otherwise normal vital signs.  Blood pressure later on was 175/97 and later improved.  Labs revealed hyponatremia 129 with hypochloremia 93 and borderline potassium of 3.5.  AST was 164 and ALT 121 with total protein of 8.3 with albumin of 3.5.  Indirect bili was 1.6 and total  bili 2 with direct bili of 0.4 and ammonia level 10.  CBC showed mild anemia.  COVID-19 PCR and influenza antigens came back negative.  Urine drug screen came back negative.  Alcohol levels less than 10.  Noncontrasted head CT scan revealed no acute intracranial abnormalities.  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 EKG showed normal sinus rhythm with a rate of 94 with slightly poor R wave progression.   The patient was given 500 mL IV normal saline bolus as well as IV thiamine and placed on CIWA protocol.  The patient will be admitted to an observation medical monitored bed for further evaluation and management.  Hospital Course:  The patient has been evaluated by neurology. They have recommended thiamine 500 mg IV x 2 days followed by PT as outpatient.   The patient was evaluated by PT. He was found to be hypoxic with ambulation. Although oximeter was not completely reliable, he did drop into the 70's. He was able to ambulate quite far, but was not confident or sure with ambulation.  This morning the patient's pulse oximetry was again checked while the patient ambulated. He remained in the 90's with ambulation.   The patient was to be discharged to home in the care of his son today with home health PT/OT on 04/16/2020. However, as nursing was preparing the patient for discharge, it was noted that the patient was very unstable on his feet and was  in danger of falling. After talking with nursing, the patient, and his son, discharge was cancelled for that day and plans were made for him to discharge to SNF within his current retirement community.   On the day of discharge the patient had complaints of cough, nasal congestion, and throat pain. COVID was negative on 04/17/2020, but Respiratory viral pathogen panel was ordered as well as a CXR. CXR demonstrated only left base atelectasis or scarring. If the findings of  the viral pathogen panel are not significant, the patient will be discharged to SNF today.  Today's assessment: S: The patient is resting comfortably. He is complaining of throat soreness, cough, and nasal congestion. O: Vitals:  Vitals:   04/16/20 0711 04/16/20 0736  BP:  (!) 166/69  Pulse:  72  Resp:  20  Temp:  97.7 F (36.5 C)  SpO2: 99% 92%   Exam:   Constitutional:   The patient is awake, alert, and oriented x 3. Mild distress from URI symptoms. Respiratory:   No increased work of breathing.  No wheezes, rales, or rhonchi  No tactile fremitus Cardiovascular:   Regular rate and rhythm  No murmurs, ectopy, or gallups.  No lateral PMI. No thrills. Abdomen:   Abdomen is soft, non-tender, non-distended  No hernias, masses, or organomegaly  Normoactive bowel sounds.  Musculoskeletal:   No cyanosis, clubbing, or edema Skin:   No rashes, lesions, ulcers  palpation of skin: no induration or nodules Neurologic:   CN 2-12 intact  Sensation all 4 extremities intact  Intention tremor present Psychiatric:  1. Mental status ? Mood: depressed ? Affect: flat ? Orientation to person, place, time  2. judgment and insight appear intact  Discharge Instructions  Discharge Instructions     Activity as tolerated - No restrictions   Complete by: As directed    Call MD for:  difficulty breathing, headache or visual disturbances   Complete by: As directed    Diet - low sodium heart healthy   Complete by: As directed    Discharge instructions   Complete by: As directed    Discharge to home to care of son with home health PT/OT Follow up with PCP in 7-10 days. Follow up with outpatient alcohol rehab program.   For home use only DME 4 wheeled rolling walker with seat   Complete by: As directed    Patient needs a walker to treat with the following condition: Ambulatory dysfunction   For home use only DME oxygen   Complete by: As directed    Length of Need:  Lifetime   Mode or (Route): Nasal cannula   Liters per Minute: 2   Frequency: Continuous (stationary and portable oxygen unit needed)   Oxygen conserving device: Yes   Oxygen delivery system: Gas   Increase activity slowly   Complete by: As directed       Allergies as of 04/18/2020   No Known Allergies     Medication List    STOP taking these medications   chlorproMAZINE 10 MG tablet Commonly known as: THORAZINE     TAKE these medications   ascorbic acid 500 MG tablet Commonly known as: VITAMIN C Take 1 tablet (500 mg total) by mouth daily.   aspirin 81 MG EC tablet Take 1 tablet (81 mg total) by mouth daily.   Combivent Respimat 20-100 MCG/ACT Aers respimat Generic drug: Ipratropium-Albuterol Inhale 1 puff into the lungs every 6 (six) hours.   dexamethasone 0.5 MG/5ML elixir TAKE 2 TEASPONFULS  BY MOUTH 3 TIMES DAILY What changed:   how much to take  how to take this  when to take this  additional instructions   dextromethorphan-guaiFENesin 30-600 MG 12hr tablet Commonly known as: MUCINEX DM Take 2 tablets by mouth 2 (two) times daily.   guaiFENesin-dextromethorphan 100-10 MG/5ML syrup Commonly known as: ROBITUSSIN DM Take 5 mLs by mouth every 4 (four) hours as needed for cough.   feeding supplement (ENSURE ENLIVE) Liqd Take 237 mLs by mouth 2 (two) times daily between meals.   folic acid 1 MG tablet Commonly known as: FOLVITE Take 1 tablet (1 mg total) by mouth daily.   magic mouthwash Soln Take 10 mLs by mouth 4 (four) times daily.   multivitamin with minerals Tabs tablet Take 1 tablet by mouth daily.   phenol 1.4 % Liqd Commonly known as: CHLORASEPTIC Use as directed 1 spray in the mouth or throat as needed for throat irritation / pain.   sodium chloride 0.65 % Soln nasal spray Commonly known as: OCEAN Place 2 sprays into both nostrils every 4 (four) hours.   thiamine 100 MG tablet Take 1 tablet (100 mg total) by mouth daily.             Durable Medical Equipment  (From admission, onward)         Start     Ordered   04/18/20 1207  DME Walker  Once    Question Answer Comment  Walker: With 5 Inch Wheels   Patient needs a walker to treat with the following condition Ambulatory dysfunction      04/18/20 1208   04/15/20 0000  For home use only DME 4 wheeled rolling walker with seat    Question:  Patient needs a walker to treat with the following condition  Answer:  Ambulatory dysfunction   04/15/20 1130   04/15/20 0000  For home use only DME oxygen    Question Answer Comment  Length of Need Lifetime   Mode or (Route) Nasal cannula   Liters per Minute 2   Frequency Continuous (stationary and portable oxygen unit needed)   Oxygen conserving device Yes   Oxygen delivery system Gas      04/15/20 1131          No Known Allergies  The results of significant diagnostics from this hospitalization (including imaging, microbiology, ancillary and laboratory) are listed below for reference.    Significant Diagnostic Studies: CT Head Wo Contrast  Result Date: 05/05/2020 CLINICAL DATA:  Balance difficulty EXAM: CT HEAD WITHOUT CONTRAST TECHNIQUE: Contiguous axial images were obtained from the base of the skull through the vertex without intravenous contrast. COMPARISON:  CT 12/16/2019, MRI 5 /10/2019 FINDINGS: Brain: No acute territorial infarction or hemorrhage is visualized. 16 mm slightly dense mass at the left foramen of Monro, consistent with colloid cysts, without significant change. Moderate atrophy. Stable ventricular prominence. Patchy white matter hypodensity consistent with chronic small vessel ischemic change. Vascular: No hyperdense vessel.  Carotid vascular calcification Skull: Normal. Negative for fracture or focal lesion. Sinuses/Orbits: Mucosal thickening in the left maxillary sinus Other: None IMPRESSION: 1. No CT evidence for acute intracranial abnormality 2. Stable colloid cyst at the left foramen of  Monro. 3. Atrophy and chronic small vessel ischemic change of the white matter Electronically Signed   By: Donavan Foil M.D.   On: 04/09/2020 20:49   MR BRAIN WO CONTRAST  Result Date: 04/14/2020 CLINICAL DATA:  Initial evaluation for acute ataxia, dizziness, leg weakness. EXAM: MRI  HEAD WITHOUT CONTRAST TECHNIQUE: Multiplanar, multiecho pulse sequences of the brain and surrounding structures were obtained without intravenous contrast. COMPARISON:  Comparison made with prior CT from earlier same day as well as previous brain MRI from 04/20/2019. FINDINGS: Brain: Diffuse prominence of the CSF containing spaces compatible generalized age-related cerebral atrophy. Mild patchy T2/FLAIR hyperintensity within the periventricular deep white matter both cerebral hemispheres most consistent with chronic small vessel ischemic disease, mild for age. No abnormal foci of restricted diffusion to suggest acute or subacute ischemia. Gray-white matter differentiation maintained. No encephalomalacia to suggest chronic cortical infarction. No foci of susceptibility artifact to suggest acute or chronic intracranial hemorrhage. Colloid cyst positioned at the left aspect of the foramen of Monro again seen, relatively stable in size measuring 16 x 10 x 10 mm. Mild ventriculomegaly is unchanged, most likely related to underlying parenchymal volume loss. No evidence for obstructive hydrocephalus. No other mass lesion, mass effect, or midline shift. No extra-axial fluid collection. Small DVA noted at the left cerebellum, stable. Pituitary gland and suprasellar region normal. Midline structures intact. Vascular: Major intracranial vascular flow voids are maintained. Skull and upper cervical spine: Craniocervical junction within normal limits. Bone marrow signal intensity normal. No scalp soft tissue abnormality. Sinuses/Orbits: Globes and orbital soft tissues within normal limits. Mild-to-moderate mucosal thickening noted within the left  maxillary sinus, likely allergic/inflammatory in nature. Paranasal sinuses are otherwise clear. No mastoid effusion. Inner ear structures grossly normal. Other: None. IMPRESSION: 1. No acute intracranial abnormality. 2. Colloid cyst positioned at the left foramen of Monro, stable from previous. Stable ventricular size without evidence for obstructive hydrocephalus. 3. Underlying age-related cerebral atrophy with mild chronic small vessel ischemic disease. Electronically Signed   By: Jeannine Boga M.D.   On: 04/14/2020 01:39   US Carotid Bilateral (at [facility] and [facility] only)  Result Date: 04/14/2020 CLINICAL DATA:  TIA. EXAM: BILATERAL CAROTID DUPLEX ULTRASOUND TECHNIQUE: Pearline Cables scale imaging, color Doppler and duplex ultrasound were performed of bilateral carotid and vertebral arteries in the neck. COMPARISON:  None. FINDINGS: Criteria: Quantification of carotid stenosis is based on velocity parameters that correlate the residual internal carotid diameter with NASCET-based stenosis levels, using the diameter of the distal internal carotid lumen as the denominator for stenosis measurement. The following velocity measurements were obtained: RIGHT ICA: 87/22 cm/sec CCA: 123456 cm/sec SYSTOLIC ICA/CCA RATIO:  1.5 ECA: 108 cm/sec LEFT ICA: 98/27 cm/sec CCA: 0000000 cm/sec SYSTOLIC ICA/CCA RATIO:  1.5 ECA: 120 cm/sec RIGHT CAROTID ARTERY: Echogenic calcified plaque at the right carotid bulb. External carotid artery is patent with normal waveform. Normal waveforms and velocities in the internal carotid artery. RIGHT VERTEBRAL ARTERY: Antegrade flow and normal waveform in the right vertebral artery. LEFT CAROTID ARTERY: Echogenic calcified plaque at the left carotid bulb. External carotid artery is patent with normal waveform. Normal waveforms and velocities in the internal carotid artery. LEFT VERTEBRAL ARTERY: Antegrade flow and normal waveform in the left vertebral artery. IMPRESSION: 1. Atherosclerotic plaque at the  carotid bulbs bilaterally. Estimated degree of stenosis in the internal carotid arteries is less than 50% bilaterally. 2. Patent vertebral arteries with antegrade flow. Electronically Signed   By: Markus Daft M.D.   On: 04/14/2020 10:31   DG Chest Port 1 View  Result Date: 04/15/2020 CLINICAL DATA:  Hypoxia EXAM: PORTABLE CHEST 1 VIEW COMPARISON:  September 01, 2016 FINDINGS: There is scarring in the left base. There is a questionable minimal left pleural effusion. The lungs elsewhere are clear. Heart size and pulmonary vascularity are  normal. No adenopathy. There is evidence of old trauma in the proximal right humerus with remodeling. There is severe arthropathy in the right shoulder. IMPRESSION: Scarring left base with questionable minimal left pleural effusion. Lungs elsewhere clear. Cardiac silhouette within normal limits. Prior trauma with remodeling proximal right humerus. Electronically Signed   By: Lowella Grip III M.D.   On: 04/15/2020 12:17   ECHOCARDIOGRAM COMPLETE  Result Date: 04/14/2020    ECHOCARDIOGRAM REPORT   Patient Name:   ESLIE ORRELL Date of Exam: 04/14/2020 Medical Rec #:  [id number]      Height:       68.0 in Accession #:    [id number]     Weight:       135.0 lb Date of Birth:  [birth year]     BSA:          1.729 m Patient Age:    41 years       BP:           154/76 mmHg Patient Gender: M              HR:           98 bpm. Exam Location:  [facility] Procedure: 2D Echo, Cardiac Doppler and Color Doppler Indications:     TIA 435.9  History:         Patient has prior history of Echocardiogram examinations, most                  recent 08/28/2016. Risk Factors:Hypertension. Near syncope.  Sonographer:     Sherrie Sport RDCS (AE) Referring Phys:  [id number] [place] Diagnosing Phys: Serafina Royals MD IMPRESSIONS  1. Left ventricular ejection fraction, by estimation, is 55 to 60%. The left ventricle has normal function. The left ventricle has no regional wall motion abnormalities. Left ventricular  diastolic parameters were normal.  2. Right ventricular systolic function is normal. The right ventricular size is normal. There is normal pulmonary artery systolic pressure.  3. The mitral valve is normal in structure. Trivial mitral valve regurgitation.  4. The aortic valve is normal in structure. Aortic valve regurgitation is not visualized. FINDINGS  Left Ventricle: Left ventricular ejection fraction, by estimation, is 55 to 60%. The left ventricle has normal function. The left ventricle has no regional wall motion abnormalities. The left ventricular internal cavity size was normal in size. There is  no left ventricular hypertrophy. Left ventricular diastolic parameters were normal. Right Ventricle: The right ventricular size is normal. No increase in right ventricular wall thickness. Right ventricular systolic function is normal. There is normal pulmonary artery systolic pressure. The tricuspid regurgitant velocity is 1.48 m/s, and  with an assumed right atrial pressure of 10 mmHg, the estimated right ventricular systolic pressure is 123456 mmHg. Left Atrium: Left atrial size was normal in size. Right Atrium: Right atrial size was normal in size. Pericardium: There is no evidence of pericardial effusion. Mitral Valve: The mitral valve is normal in structure. Trivial mitral valve regurgitation. Tricuspid Valve: The tricuspid valve is normal in structure. Tricuspid valve regurgitation is trivial. Aortic Valve: The aortic valve is normal in structure. Aortic valve regurgitation is not visualized. Aortic valve mean gradient measures 2.0 mmHg. Aortic valve peak gradient measures 4.2 mmHg. Aortic valve area, by VTI measures 2.75 cm. Pulmonic Valve: The pulmonic valve was normal in structure. Pulmonic valve regurgitation is not visualized. Aorta: The aortic root and ascending aorta are structurally normal, with no evidence of dilitation. IAS/Shunts: No atrial level  shunt detected by color flow Doppler.  LEFT VENTRICLE  PLAX 2D LVIDd:         3.92 cm  Diastology LVIDs:         2.60 cm  LV e' lateral:   5.55 cm/s LV PW:         1.02 cm  LV E/e' lateral: 11.2 LV IVS:        1.06 cm  LV e' medial:    5.55 cm/s LVOT diam:     2.00 cm  LV E/e' medial:  11.2 LV SV:         47 LV SV Index:   27 LVOT Area:     3.14 cm  RIGHT VENTRICLE RV Basal diam:  3.38 cm RV S prime:     21.40 cm/s TAPSE (M-mode): 4.0 cm LEFT ATRIUM           Index       RIGHT ATRIUM           Index LA diam:      3.00 cm 1.73 cm/m  RA Area:     12.70 cm LA Vol (A2C): 33.6 ml 19.43 ml/m RA Volume:   26.10 ml  15.09 ml/m LA Vol (A4C): 51.1 ml 29.55 ml/m  AORTIC VALVE                   PULMONIC VALVE AV Area (Vmax):    2.58 cm    PV Vmax:        0.79 m/s AV Area (Vmean):   2.34 cm    PV Peak grad:   2.5 mmHg AV Area (VTI):     2.75 cm    RVOT Peak grad: 4 mmHg AV Vmax:           102.55 cm/s AV Vmean:          64.500 cm/s AV VTI:            0.172 m AV Peak Grad:      4.2 mmHg AV Mean Grad:      2.0 mmHg LVOT Vmax:         84.20 cm/s LVOT Vmean:        48.100 cm/s LVOT VTI:          0.150 m LVOT/AV VTI ratio: 0.87  AORTA Ao Root diam: 2.90 cm MITRAL VALVE               TRICUSPID VALVE MV Area (PHT): 2.62 cm    TR Peak grad:   8.8 mmHg MV Decel Time: 289 msec    TR Vmax:        148.00 cm/s MV E velocity: 62.10 cm/s MV A velocity: 85.90 cm/s  SHUNTS MV E/A ratio:  0.72        Systemic VTI:  0.15 m                            Systemic Diam: 2.00 cm Serafina Royals MD Electronically signed by Serafina Royals MD Signature Date/Time: 04/14/2020/1:42:34 PM    Final     Microbiology: Recent Results (from the past 240 hour(s))  Respiratory Panel by RT PCR (Flu A&B, Covid) - Nasopharyngeal Swab     Status: None   Collection Time: 04/14/2020  7:41 PM   Specimen: Nasopharyngeal Swab  Result Value Ref Range Status   SARS Coronavirus 2 by RT PCR NEGATIVE NEGATIVE Final    Comment: (NOTE) SARS-CoV-2  target nucleic acids are NOT DETECTED. The SARS-CoV-2 RNA is generally  detectable in upper respiratoy specimens during the acute phase of infection. The lowest concentration of SARS-CoV-2 viral copies this assay can detect is 131 copies/mL. A negative result does not preclude SARS-Cov-2 infection and should not be used as the sole basis for treatment or other patient management decisions. A negative result may occur with  improper specimen collection/handling, submission of specimen other than nasopharyngeal swab, presence of viral mutation(s) within the areas targeted by this assay, and inadequate number of viral copies (<131 copies/mL). A negative result must be combined with clinical observations, patient history, and epidemiological information. The expected result is Negative. Fact Sheet for Patients:  PinkCheek.be Fact Sheet for Healthcare Providers:  GravelBags.it This test is not yet ap proved or cleared by the [place] FDA and  has been authorized for detection and/or diagnosis of SARS-CoV-2 by FDA under an Emergency Use Authorization (EUA). This EUA will remain  in effect (meaning this test can be used) for the duration of the COVID-19 declaration under Section 564(b)(1) of the Act, 21 U.S.C. section 360bbb-3(b)(1), unless the authorization is terminated or revoked sooner.    Influenza A by PCR NEGATIVE NEGATIVE Final   Influenza B by PCR NEGATIVE NEGATIVE Final    Comment: (NOTE) The Xpert Xpress SARS-CoV-2/FLU/RSV assay is intended as an aid in  the diagnosis of influenza from Nasopharyngeal swab specimens and  should not be used as a sole basis for treatment. Nasal washings and  aspirates are unacceptable for Xpert Xpress SARS-CoV-2/FLU/RSV  testing. Fact Sheet for Patients: PinkCheek.be Fact Sheet for Healthcare Providers: GravelBags.it This test is not yet approved or cleared by the [place] FDA and  has been  authorized for detection and/or diagnosis of SARS-CoV-2 by  FDA under an Emergency Use Authorization (EUA). This EUA will remain  in effect (meaning this test can be used) for the duration of the  Covid-19 declaration under Section 564(b)(1) of the Act, 21  U.S.C. section 360bbb-3(b)(1), unless the authorization is  terminated or revoked. Performed at [facility], [address] [place], [place] [address]      Labs: Basic Metabolic Panel: Recent Labs  Lab 04/30/2020 1641 04/15/20 0441  NA 129* 136  K 3.5 3.7  CL 93* 104  CO2 25 24  GLUCOSE 108* 91  BUN 15 12  CREATININE 0.93 0.73  CALCIUM 8.2* 7.6*   Liver Function Tests: Recent Labs  Lab 04/30/2020 1641  AST 164*  ALT 121*  ALKPHOS 61  BILITOT 2.0*  PROT 8.3*  ALBUMIN 3.5   No results for input(s): LIPASE, AMYLASE in the last 168 hours. Recent Labs  Lab 04/20/2020 2054  AMMONIA 10   CBC: Recent Labs  Lab 04/28/2020 1641  WBC 7.1  HGB 12.9*  HCT 38.1*  MCV 96.2  PLT 165   Cardiac Enzymes: Recent Labs  Lab 05/04/2020 1641  CKTOTAL 61   BNP: BNP (last 3 results) No results for input(s): BNP in the last 8760 hours.  ProBNP (last 3 results) No results for input(s): PROBNP in the last 8760 hours.  CBG: No results for input(s): GLUCAP in the last 168 hours.  Active Problems:   Ataxia   Unable to walk   Protein-calorie malnutrition, severe   Time coordinating discharge: 38 minutes  Signed:        Deeric Cruise, DO [facility]  04/18/2020, 12:39

## 2020-04-16 NOTE — Progress Notes (Signed)
Patient bed alarm ringing. This RN went to the bedside to see patient's son attempting to help patient use urinal. This RN assisted the patient to stand at the bedside to use urinal. Patient was unsteady on his feet. Patient incontinent of bowel. Primary RN for this patient notified.

## 2020-04-16 NOTE — Plan of Care (Signed)

## 2020-04-16 NOTE — Discharge Instructions (Signed)
[facility] services

## 2020-04-17 LAB — SARS CORONAVIRUS 2 (TAT 6-24 HRS): SARS Coronavirus 2: NEGATIVE

## 2020-04-17 MED ORDER — PHENOL 1.4 % MT LIQD
1.0000 | OROMUCOSAL | Status: DC | PRN
  Filled 2020-04-17: qty 177

## 2020-04-17 MED ORDER — PHENOL 1.4 % MT LIQD: 1.0000 | OROMUCOSAL | 0 refills | Status: AC | PRN

## 2020-04-17 NOTE — Progress Notes (Signed)
PROGRESS NOTE  Henry Mays [id number] DOB: 1941-10-25 DOA: 04/15/2020 PCP: Leone Haven, MD  Brief History   Henry Mays  is a 79 y.o. Caucasian male with a known history of hypertension, osteoarthritis, alcohol abuse and C. difficile colitis, who presented to the emergency room with acute onset of ataxia and inability to ambulate.  He had a fall in January and a couple weeks ago.  Today he had to use his walker without he was feeling significantly weak in both legs and unable to ambulate.  He drinks a couple of beer per night not on a regular basis.  He lives by himself.  He denies any paresthesias or other focal muscle weakness.  No nausea or vomiting or diarrhea or abdominal pain.  No headache or dizziness or blurred vision.  Patient was noted to have intention tremors in the ER and when he stood up to walk he was unsteady and needed 2 person assist with a shuffling like gait.  Upon presentation to the emergency room, temperature was 99.4 and heart rate 103 with otherwise normal vital signs.  Blood pressure later on was 175/97 and later improved.  Labs revealed hyponatremia 129 with hypochloremia 93 and borderline potassium of 3.5.  AST was 164 and ALT 121 with total protein of 8.3 with albumin of 3.5.  Indirect bili was 1.6 and total bili 2 with direct bili of 0.4 and ammonia level 10.  CBC showed mild anemia.  COVID-19 PCR and influenza antigens came back negative.  Urine drug screen came back negative.  Alcohol levels less than 10.  Noncontrasted head CT scan revealed no acute intracranial abnormalities.  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 EKG showed normal sinus rhythm with a rate of 94 with slightly poor R wave progression.  The patient was given 500 mL IV normal saline bolus as well as IV thiamine and placed on CIWA protocol.  The patient will be admitted to an observation medical  monitored bed for further evaluation and management.  The patient has been evaluated by neurology. They have recommended thiamine 500 mg IV x 2 days followed by PT as outpatient.  The patient was evaluated by PT. Their recommendation was for discharge to home with home health PT. This was also the patient's desire. However, when the patient was being prepared for discharge, nursing found him to be very unsteady on his feet and unsafe to be left on his own. After speaking with the patient and the patient's son, it was determined that discharge would be cancelled and disposition would be changed to SNF, preferably within the patient's residential community in which he had previously resided in an independent living situation.  Consultants  . Neurology  Procedures  . None  Antibiotics   Anti-infectives (From admission, onward)   None     Subjective  The patient is resting quietly. No new complaints.  Objective   Vitals:  Vitals:   04/17/20 0020 04/17/20 0022  BP:  (!) 144/68  Pulse:  80  Resp: 18 18  Temp:  98.3 F (36.8 C)  SpO2:  99%   Exam:  Constitutional:  . The patient is awake, alert, and oriented x 3. No acute distress. Respiratory:  . No increased work of breathing. . No wheezes, rales, or rhonchi . No tactile fremitus Cardiovascular:  . Regular rate and rhythm . No murmurs, ectopy, or gallups. . No lateral PMI.  No thrills. Abdomen:  . Abdomen is soft, non-tender, non-distended . No hernias, masses, or organomegaly . Normoactive bowel sounds.  Musculoskeletal:  . No cyanosis, clubbing, or edema Skin:  . No rashes, lesions, ulcers . palpation of skin: no induration or nodules Neurologic:  . CN 2-12 intact . Sensation all 4 extremities intact . Intention tremor present Psychiatric:  . Mental status o Mood: depressed o Affect: flat o Orientation to person, place, time  . judgment and insight appear intact  I have personally reviewed the following:     Today's Data  . Vitals  Imaging  . MRI brain: Atrophy and chronic smaall vessel disease. Colloid cyst at left foramen of Monro. Stable. No sign of obstructive hydrocephalus. BL carotid doppler: Atherosclerotic plaque at the carotid bulbs bilaterally. Estimated degree of stenosis in the internal carotid arteries is less than 50% bilaterally. Patent vertebral arteries with antegrade flow. .   Cardiology Data  . Echocardiogram: EF of 55-60%. Normal LV function. No regional wall motion abnormalities. Normal diastolic parameters. RV systolic function is normal. Normal PASP. No atrial level shunts. No intracardiac source of thrombus documented.  CXR: Scarring was seen at the left base with a questionable but minimal left pleural effusions.   Scheduled Meds: .  stroke: mapping our early stages of recovery book   Does not apply Once  . vitamin C  500 mg Oral Daily  . aspirin EC  81 mg Oral Daily  . dexamethasone  0.25 mg Oral Daily  . enoxaparin (LOVENOX) injection  40 mg Subcutaneous Q24H  . feeding supplement (ENSURE ENLIVE)  237 mL Oral BID BM  . folic acid  1 mg Oral Daily  . multivitamin with minerals  1 tablet Oral Daily   Continuous Infusions: . sodium chloride Stopped (04/16/20 [id number])    Active Problems:   Ataxia   Unable to walk   Protein-calorie malnutrition, severe   LOS: 3 days   A & P  Ataxia with unsteady gait and recent recurrent falls and inability to ambulate: CVA ruled out. Neurology was consulted and has ruled out parkinsons. He has suggested possibly Wernickes, and has recommended 2 days of high dose (500mg ) IV thiamine. This has been started. He also feels that electrolyte abnormalities are playing a role. These will continue to be addressed and evaluated. Transitions of care has been consulted to seek SNF placement for rehab for patient.  Alcohol abuse with elevated LFTs due to alcoholic hepatitis: Counsel alcohol rehab and cessation of use. Continue  supplementation with folic acid, vitamins and high dose IV thiamine as recommended by neurology. As needed ativan is available for symptoms of withdrawal.  Mild hyponatremia and hypochloremia: Likely due to ETOH use. Will place on fluid restriction. Monitor..  Hypertensive urgency: Blood pressures remain elevated. No CVA, so need for permissive hypertension. Will add Norvasc and as needed hydralazine.  Hypoxia with activity: Likely chronic. Will recheck ambulatory SaO2 in the morning. I believe that the patient will need Oxygen for discharge.  I have seen and examined this patient myself. I have spent 30 minutes in his evaluation and care.  DVT prophylaxis: Lovenox CODE STATUS: Full Code Family Communication: None available. Disposition: Patient is from home. Disposition at discharge anticipated to be to SNF for rehab.. Barriers to discharge: Arrangements for SNF placement. The patient is medically cleared for discharge.  Henry Brule, DO [facility] Direct contact: see [email or web address]  7PM-7AM contact night coverage as above 04/17/2020, 11:53 AM  LOS: 0 days

## 2020-04-17 NOTE — Progress Notes (Addendum)
Physical Therapy Treatment Patient Details Name: Usama Boshers MRN: [id number] DOB: 01/06/1941 Today's Date: 04/17/2020    History of Present Illness Henry Mays is a 6yoM who comes to [facility] on 5/5 c BLE weakness and unsteadiness. Pt reports this  began about 2 weeks ago, insidious onset, severe general weakness in BLE, no focal strenght loss or paresthesias. Pt reports prior to 2 weeks ago he would walk a few tiems weekly for exercise outside without device, now he is unable to AMB without using a RW. PMH: HTN, OA< ETOH abuse, C Diff collitis. Imaging thus far unremarkable. Seen by neurology already.    PT Comments    Ready for session.  OOB with min guard and rails.  Stood and was able to walk with RW and min guard.  Once in hallway, pt inc loose BM and turned quickly to make it back to the bathroom with continued incontinence.  To commode and care provided by Agricultural consultant.  Obtained brief and he is able to walk around nursing unit with +1 assist.  Some SOB noted but sats 97% upon sitting.  Reports general fatigue.  Overall improved gait and mobility today.  RN observed in hallway and confirms improved balance.  Pt stated his son is here and another is coming to stay with him.  Recommend +1 assist for mobility upon discharge.  PT lives at [facility].  If pt and family feel further assistance is needed, he may benefit from a respite stay at facility or private aides in the home.  Son was in room but left after BM in hallway and had not returned at end of session to discuss.  Pt feels he is ready to return home to cottage with support of son's but stated "They will decide for me."   Follow Up Recommendations  Home health PT;Supervision for mobility/OOB     Equipment Recommendations  Rolling walker with 5" wheels    Recommendations for Other Services       Precautions / Restrictions Precautions Precautions: Fall Restrictions Weight Bearing Restrictions: No    Mobility  Bed  Mobility Overal bed mobility: Needs Assistance Bed Mobility: Supine to Sit     Supine to sit: Supervision        Transfers Overall transfer level: Modified independent Equipment used: Rolling walker (2 wheeled)                Ambulation/Gait Ambulation/Gait assistance: Supervision;Min guard Gait Distance (Feet): 200 Feet Assistive device: Rolling walker (2 wheeled) Gait Pattern/deviations: Narrow base of support;Step-through pattern Gait velocity: WNL   General Gait Details: generally steadier today but still requires +1 assist for safety   Stairs             Wheelchair Mobility    Modified Rankin (Stroke Patients Only)       Balance Overall balance assessment: Needs assistance Sitting-balance support: Feet supported Sitting balance-Leahy Scale: Good     Standing balance support: Bilateral upper extremity supported Standing balance-Leahy Scale: Fair                              Cognition Arousal/Alertness: Awake/alert Behavior During Therapy: WFL for tasks assessed/performed Overall Cognitive Status: Within Functional Limits for tasks assessed                                 General Comments: Pt is A and  O x 4 and motivated. Follows commands well with good carry-over      Exercises Other Exercises Other Exercises: to bathroom    General Comments        Pertinent Vitals/Pain Pain Assessment: No/denies pain    Home Living                      Prior Function            PT Goals (current goals can now be found in the care plan section) Progress towards PT goals: Progressing toward goals    Frequency    7X/week      PT Plan Current plan remains appropriate    Co-evaluation              AM-PAC PT "6 Clicks" Mobility   Outcome Measure  Help needed turning from your back to your side while in a flat bed without using bedrails?: None Help needed moving from lying on your back to  sitting on the side of a flat bed without using bedrails?: None Help needed moving to and from a bed to a chair (including a wheelchair)?: A Little Help needed standing up from a chair using your arms (e.g., wheelchair or bedside chair)?: A Little Help needed to walk in hospital room?: A Little Help needed climbing 3-5 steps with a railing? : A Little 6 Click Score: 20    End of Session Equipment Utilized During Treatment: Gait belt Activity Tolerance: Patient tolerated treatment well;No increased pain Patient left: in chair;with call bell/phone within reach;with chair alarm set Nurse Communication: Mobility status       Time: [id number] PT Time Calculation (min) (ACUTE ONLY): 33 min  Charges:  $Gait Training: 8-22 mins $Therapeutic Activity: 8-22 mins                    Chesley Noon, PTA 04/17/20, 11:24 AM

## 2020-04-17 NOTE — Progress Notes (Signed)
PROGRESS NOTE  Masanobu Colas [id number] DOB: Sep 29, 1941 DOA: 04/17/2020 PCP: Leone Haven, MD  Brief History   Gavynn Pole  is a 79 y.o. Caucasian male with a known history of hypertension, osteoarthritis, alcohol abuse and C. difficile colitis, who presented to the emergency room with acute onset of ataxia and inability to ambulate.  He had a fall in January and a couple weeks ago.  Today he had to use his walker without he was feeling significantly weak in both legs and unable to ambulate.  He drinks a couple of beer per night not on a regular basis.  He lives by himself.  He denies any paresthesias or other focal muscle weakness.  No nausea or vomiting or diarrhea or abdominal pain.  No headache or dizziness or blurred vision.  Patient was noted to have intention tremors in the ER and when he stood up to walk he was unsteady and needed 2 person assist with a shuffling like gait.  Upon presentation to the emergency room, temperature was 99.4 and heart rate 103 with otherwise normal vital signs.  Blood pressure later on was 175/97 and later improved.  Labs revealed hyponatremia 129 with hypochloremia 93 and borderline potassium of 3.5.  AST was 164 and ALT 121 with total protein of 8.3 with albumin of 3.5.  Indirect bili was 1.6 and total bili 2 with direct bili of 0.4 and ammonia level 10.  CBC showed mild anemia.  COVID-19 PCR and influenza antigens came back negative.  Urine drug screen came back negative.  Alcohol levels less than 10.  Noncontrasted head CT scan revealed no acute intracranial abnormalities.  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 EKG showed normal sinus rhythm with a rate of 94 with slightly poor R wave progression.  The patient was given 500 mL IV normal saline bolus as well as IV thiamine and placed on CIWA protocol.  The patient will be admitted to an observation medical  monitored bed for further evaluation and management.  The patient has been evaluated by neurology. They have recommended thiamine 500 mg IV x 2 days followed by PT as outpatient.  The patient was evaluated by PT. Their recommendation was for discharge to home with home health PT. This was also the patient's desire. However, when the patient was being prepared for discharge, nursing found him to be very unsteady on his feet and unsafe to be left on his own. After speaking with the patient and the patient's son, it was determined that discharge would be cancelled and disposition would be changed to SNF, preferably within the patient's residential community in which he had previously resided in an independentn living situation.  Consultants  . Neurology  Procedures  . None  Antibiotics   Anti-infectives (From admission, onward)   None     Subjective  The patient is resting quietly. No new complaints.  Objective   Vitals:  Vitals:   04/17/20 0020 04/17/20 0022  BP:  (!) 144/68  Pulse:  80  Resp: 18 18  Temp:  98.3 F (36.8 C)  SpO2:  99%   Exam:  Constitutional:  . The patient is awake, alert, and oriented x 3. No acute distress. Respiratory:  . No increased work of breathing. . No wheezes, rales, or rhonchi . No tactile fremitus Cardiovascular:  . Regular rate and rhythm . No murmurs, ectopy, or gallups. . No lateral PMI.  No thrills. Abdomen:  . Abdomen is soft, non-tender, non-distended . No hernias, masses, or organomegaly . Normoactive bowel sounds.  Musculoskeletal:  . No cyanosis, clubbing, or edema Skin:  . No rashes, lesions, ulcers . palpation of skin: no induration or nodules Neurologic:  . CN 2-12 intact . Sensation all 4 extremities intact . Intention tremor present Psychiatric:  . Mental status o Mood: depressed o Affect: flat o Orientation to person, place, time  . judgment and insight appear intact  I have personally reviewed the following:    Today's Data  . Vitals  Imaging  . MRI brain: Atrophy and chronic smaall vessel disease. Colloid cyst at left foramen of Monro. Stable. No sign of obstructive hydrocephalus. BL carotid doppler: Atherosclerotic plaque at the carotid bulbs bilaterally. Estimated degree of stenosis in the internal carotid arteries is less than 50% bilaterally. Patent vertebral arteries with antegrade flow. .   Cardiology Data  . Echocardiogram: EF of 55-60%. Normal LV function. No regional wall motion abnormalities. Normal diastolic parameters. RV systolic function is normal. Normal PASP. No atrial level shunts. No intracardiac source of thrombus documented.  CXR: Scarring was seen at the left base with a questionable but minimal left pleural effusions.   Scheduled Meds: .  stroke: mapping our early stages of recovery book   Does not apply Once  . vitamin C  500 mg Oral Daily  . aspirin EC  81 mg Oral Daily  . dexamethasone  0.25 mg Oral Daily  . enoxaparin (LOVENOX) injection  40 mg Subcutaneous Q24H  . feeding supplement (ENSURE ENLIVE)  237 mL Oral BID BM  . folic acid  1 mg Oral Daily  . multivitamin with minerals  1 tablet Oral Daily   Continuous Infusions: . sodium chloride Stopped (04/16/20 [id number])    Active Problems:   Ataxia   Unable to walk   Protein-calorie malnutrition, severe   LOS: 3 days   A & P  Ataxia with unsteady gait and recent recurrent falls and inability to ambulate: CVA ruled out. Neurology was consulted and has ruled out parkinsons. He has suggested possibly Wernickes, and has recommended 2 days of high dose (500mg ) IV thiamine. This has been started. He also feels that electrolyte abnormalities are playing a role. These will continue to be addressed and evaluated. Transitions of care has been consulted to seek SNF placement for rehab for patient.  Alcohol abuse with elevated LFTs due to alcoholic hepatitis: Counsel alcohol rehab and cessation of use. Continue  supplementation with folic acid, vitamins and high dose IV thiamine as recommended by neurology. As needed ativan is available for symptoms of withdrawal.  Mild hyponatremia and hypochloremia: Likely due to ETOH use. Will place on fluid restriction. Monitor..  Hypertensive urgency: Blood pressures remain elevated. No CVA, so need for permissive hypertension. Will add Norvasc and as needed hydralazine.  Hypoxia with activity: Likely chronic. Will recheck ambulatory SaO2 in the morning. I believe that the patient will need Oxygen for discharge.  I have seen and examined this patient myself. I have spent 40 minutes in his evaluation and care.  DVT prophylaxis: Lovenox CODE STATUS: Full Code Family Communication: None available. Disposition: Patient is from home. Disposition at discharge anticipated to be to SNF for rehab.. Barriers to discharge: Arrangements for SNF placement. The patient is medically cleared for discharge.  Nikkita Adeyemi, DO [facility] Direct contact: see [email or web address]  7PM-7AM contact night coverage as above 04/16/2020, 6:58 PM  LOS: 0 days

## 2020-04-18 ENCOUNTER — Inpatient Hospital Stay: Payer: Medicare Other

## 2020-04-18 ENCOUNTER — Telehealth: Payer: Self-pay | Admitting: Family Medicine

## 2020-04-18 ENCOUNTER — Encounter
Admission: RE | Admit: 2020-04-18 | Discharge: 2020-04-18 | Disposition: A | Discharge status: Home / Self Care | Payer: Medicare Other | Source: Ambulatory Visit | Attending: Internal Medicine | Admitting: Internal Medicine

## 2020-04-18 DIAGNOSIS — R569 Unspecified convulsions: Secondary | ICD-10-CM

## 2020-04-18 DIAGNOSIS — E43 Unspecified severe protein-calorie malnutrition: Secondary | ICD-10-CM

## 2020-04-18 DIAGNOSIS — J9601 Acute respiratory failure with hypoxia: Secondary | ICD-10-CM

## 2020-04-18 LAB — CBC
HCT: 33.9 % — ABNORMAL LOW (ref 39.0–52.0)
Hemoglobin: 11.6 g/dL — ABNORMAL LOW (ref 13.0–17.0)
MCH: 32.2 pg (ref 26.0–34.0)
MCHC: 34.2 g/dL (ref 30.0–36.0)
MCV: 94.2 fL (ref 80.0–100.0)
Platelets: 151 10*3/uL (ref 150–400)
RBC: 3.6 MIL/uL — ABNORMAL LOW (ref 4.22–5.81)
RDW: 13.3 % (ref 11.5–15.5)
WBC: 10.3 10*3/uL (ref 4.0–10.5)
nRBC: 0 % (ref 0.0–0.2)

## 2020-04-18 LAB — COMPREHENSIVE METABOLIC PANEL
ALT: 29 U/L (ref 0–44)
AST: 24 U/L (ref 15–41)
Albumin: 3.1 g/dL — ABNORMAL LOW (ref 3.5–5.0)
Alkaline Phosphatase: 49 U/L (ref 38–126)
Anion gap: 9 (ref 5–15)
BUN: 14 mg/dL (ref 8–23)
CO2: 25 mmol/L (ref 22–32)
Calcium: 8.2 mg/dL — ABNORMAL LOW (ref 8.9–10.3)
Chloride: 96 mmol/L — ABNORMAL LOW (ref 98–111)
Creatinine, Ser: 0.79 mg/dL (ref 0.61–1.24)
GFR calc Af Amer: >60 mL/min (ref >60)
GFR calc non Af Amer: >60 mL/min (ref >60)
Glucose, Bld: 206 mg/dL — ABNORMAL HIGH (ref 70–99)
Potassium: 3.3 mmol/L — ABNORMAL LOW (ref 3.5–5.1)
Sodium: 130 mmol/L — ABNORMAL LOW (ref 135–145)
Total Bilirubin: 1.3 mg/dL — ABNORMAL HIGH (ref 0.3–1.2)
Total Protein: 7.4 g/dL (ref 6.5–8.1)

## 2020-04-18 LAB — BLOOD GAS, ARTERIAL
Acid-base deficit: 0.4 mmol/L (ref 0.0–2.0)
Bicarbonate: 24.8 mmol/L (ref 20.0–28.0)
FIO2: 0.3
MECHVT: 450 mL
Mechanical Rate: 18
O2 Saturation: 98.8 %
PEEP: 5 cmH2O
Patient temperature: 37
pCO2 arterial: 42 mmHg (ref 32.0–48.0)
pH, Arterial: 7.38 (ref 7.350–7.450)
pO2, Arterial: 127 mmHg — ABNORMAL HIGH (ref 83.0–108.0)

## 2020-04-18 LAB — GLUCOSE, CAPILLARY: Glucose-Capillary: 149 mg/dL — ABNORMAL HIGH (ref 70–99)

## 2020-04-18 LAB — PHOSPHORUS
Phosphorus: 2 mg/dL — ABNORMAL LOW (ref 2.5–4.6)
Phosphorus: 3.5 mg/dL (ref 2.5–4.6)

## 2020-04-18 LAB — MAGNESIUM
Magnesium: 1.9 mg/dL (ref 1.7–2.4)
Magnesium: 1.8 mg/dL (ref 1.7–2.4)

## 2020-04-18 MED ORDER — CHLORHEXIDINE GLUCONATE CLOTH 2 % EX PADS
6.0000 | MEDICATED_PAD | Freq: Every day | CUTANEOUS | Status: DC
  Administered 2020-04-18 – 2020-04-20 (×3): 6 via TOPICAL

## 2020-04-18 MED ORDER — VITAL HIGH PROTEIN PO LIQD
1000.0000 mL | ORAL | Status: DC
  Administered 2020-04-18: 21:00:00 1000 mL

## 2020-04-18 MED ORDER — FENTANYL 2500MCG IN NS 250ML (10MCG/ML) PREMIX INFUSION
25.0000 ug/h | INTRAVENOUS | Status: DC
  Administered 2020-04-18: 20:00:00 25 ug/h via INTRAVENOUS
  Administered 2020-04-19: 200 ug/h via INTRAVENOUS
  Filled 2020-04-18 (×2): qty 250

## 2020-04-18 MED ORDER — MIDAZOLAM HCL 2 MG/2ML IJ SOLN: 2.0000 mg | Freq: Once | INTRAMUSCULAR | Status: AC

## 2020-04-18 MED ORDER — DOCUSATE SODIUM 50 MG/5ML PO LIQD: 100.0000 mg | Freq: Two times a day (BID) | ORAL | Status: DC

## 2020-04-18 MED ORDER — GUAIFENESIN-DM 100-10 MG/5ML PO SYRP
5.0000 mL | ORAL_SOLUTION | ORAL | Status: DC | PRN
  Administered 2020-04-18: 5 mL via ORAL
  Filled 2020-04-18: qty 5

## 2020-04-18 MED ORDER — SODIUM CHLORIDE 0.9 % IV BOLUS
1000.0000 mL | Freq: Once | INTRAVENOUS | Status: AC
  Administered 2020-04-18: 20:00:00 1000 mL via INTRAVENOUS

## 2020-04-18 MED ORDER — LEVETIRACETAM IN NACL 1000 MG/100ML IV SOLN
1000.0000 mg | Freq: Once | INTRAVENOUS | Status: AC
  Administered 2020-04-18: 16:00:00 1000 mg via INTRAVENOUS
  Filled 2020-04-18: qty 100

## 2020-04-18 MED ORDER — LEVETIRACETAM IN NACL 500 MG/100ML IV SOLN
500.0000 mg | Freq: Two times a day (BID) | INTRAVENOUS | Status: DC
  Administered 2020-04-19 – 2020-04-20 (×3): 500 mg via INTRAVENOUS
  Filled 2020-04-18 (×5): qty 100

## 2020-04-18 MED ORDER — DM-GUAIFENESIN ER 30-600 MG PO TB12
2.0000 | ORAL_TABLET | Freq: Two times a day (BID) | ORAL | Status: DC
  Administered 2020-04-18: 11:00:00 2 via ORAL
  Filled 2020-04-18 (×2): qty 1

## 2020-04-18 MED ORDER — MAGIC MOUTHWASH
10.0000 mL | Freq: Four times a day (QID) | ORAL | Status: DC
  Administered 2020-04-18 – 2020-04-19 (×3): 10 mL via ORAL
  Filled 2020-04-18 (×8): qty 10

## 2020-04-18 MED ORDER — FENTANYL CITRATE (PF) 100 MCG/2ML IJ SOLN
INTRAMUSCULAR | Status: AC
  Administered 2020-04-18: 50 ug via INTRAVENOUS
  Filled 2020-04-18: qty 2

## 2020-04-18 MED ORDER — LIDOCAINE VISCOUS HCL 2 % MT SOLN
15.0000 mL | Freq: Four times a day (QID) | OROMUCOSAL | Status: DC
  Administered 2020-04-18: 07:00:00 15 mL via OROMUCOSAL
  Filled 2020-04-18: qty 15

## 2020-04-18 MED ORDER — POLYETHYLENE GLYCOL 3350 17 G PO PACK: 17.0000 g | PACK | Freq: Every day | ORAL | Status: DC | PRN

## 2020-04-18 MED ORDER — LORAZEPAM 2 MG/ML IJ SOLN: 1.0000 mg | Freq: Once | INTRAMUSCULAR | Status: DC

## 2020-04-18 MED ORDER — NOREPINEPHRINE 4 MG/250ML-% IV SOLN
2.0000 ug/min | INTRAVENOUS | Status: DC
  Administered 2020-04-18: 20:00:00 10 ug/min via INTRAVENOUS
  Administered 2020-04-19: 05:00:00 7 ug/min via INTRAVENOUS
  Filled 2020-04-18: qty 250

## 2020-04-18 MED ORDER — LORAZEPAM 2 MG/ML IJ SOLN
1.0000 mg | INTRAMUSCULAR | Status: AC | PRN
  Administered 2020-04-20: 4 mg via INTRAVENOUS
  Administered 2020-04-21: 2 mg via INTRAVENOUS
  Administered 2020-04-21 (×2): 4 mg via INTRAVENOUS
  Filled 2020-04-18 (×2): qty 2
  Filled 2020-04-18 (×2): qty 1
  Filled 2020-04-18: qty 2

## 2020-04-18 MED ORDER — THIAMINE HCL 100 MG/ML IJ SOLN
500.0000 mg | Freq: Every day | INTRAVENOUS | Status: DC
  Administered 2020-04-18: 500 mg via INTRAVENOUS
  Filled 2020-04-18 (×2): qty 5

## 2020-04-18 MED ORDER — DM-GUAIFENESIN ER 30-600 MG PO TB12: 2.0000 | ORAL_TABLET | Freq: Two times a day (BID) | ORAL | 0 refills | Status: AC

## 2020-04-18 MED ORDER — FOLIC ACID 1 MG PO TABS: 1.0000 mg | ORAL_TABLET | Freq: Every day | ORAL | Status: DC

## 2020-04-18 MED ORDER — ROCURONIUM BROMIDE 50 MG/5ML IV SOLN: 50.0000 mg | INTRAVENOUS | Status: AC

## 2020-04-18 MED ORDER — LIDOCAINE VISCOUS HCL 2 % MT SOLN
15.0000 mL | Freq: Four times a day (QID) | OROMUCOSAL | Status: DC
  Filled 2020-04-18: qty 15

## 2020-04-18 MED ORDER — LORAZEPAM 1 MG PO TABS: 1.0000 mg | ORAL_TABLET | ORAL | Status: AC | PRN

## 2020-04-18 MED ORDER — POTASSIUM CHLORIDE 10 MEQ/100ML IV SOLN
10.0000 meq | INTRAVENOUS | Status: AC
  Administered 2020-04-18: 10 meq via INTRAVENOUS
  Filled 2020-04-18 (×7): qty 100

## 2020-04-18 MED ORDER — THIAMINE HCL 100 MG/ML IJ SOLN: 100.0000 mg | Freq: Every day | INTRAMUSCULAR | Status: DC

## 2020-04-18 MED ORDER — DEXAMETHASONE 1 MG/ML PO CONC
0.2500 mg | Freq: Every day | ORAL | Status: DC
  Administered 2020-04-19: 0.25 mg
  Filled 2020-04-18 (×4): qty 0.25

## 2020-04-18 MED ORDER — GUAIFENESIN-DM 100-10 MG/5ML PO SYRP: 5.0000 mL | ORAL_SOLUTION | ORAL | 0 refills | Status: AC | PRN

## 2020-04-18 MED ORDER — SALINE SPRAY 0.65 % NA SOLN: 2.0000 | NASAL | 0 refills | Status: AC

## 2020-04-18 MED ORDER — SODIUM CHLORIDE 0.9 % IV SOLN
1.0000 mg | Freq: Once | INTRAVENOUS | Status: AC
  Administered 2020-04-18: 1 mg via INTRAVENOUS
  Filled 2020-04-18: qty 0.2

## 2020-04-18 MED ORDER — ADULT MULTIVITAMIN W/MINERALS CH: 1.0000 | ORAL_TABLET | Freq: Every day | ORAL | Status: DC

## 2020-04-18 MED ORDER — FENTANYL BOLUS VIA INFUSION
25.0000 ug | INTRAVENOUS | Status: DC | PRN
  Filled 2020-04-18: qty 25

## 2020-04-18 MED ORDER — DEXMEDETOMIDINE HCL IN NACL 400 MCG/100ML IV SOLN
0.0000 ug/kg/h | INTRAVENOUS | Status: DC
  Administered 2020-04-18: 0.4 ug/kg/h via INTRAVENOUS

## 2020-04-18 MED ORDER — POLYETHYLENE GLYCOL 3350 17 G PO PACK: 17.0000 g | PACK | Freq: Every day | ORAL | Status: DC

## 2020-04-18 MED ORDER — FENTANYL CITRATE (PF) 100 MCG/2ML IJ SOLN
100.0000 ug | Freq: Once | INTRAMUSCULAR | Status: AC
  Administered 2020-04-18: 20:00:00 100 ug via INTRAVENOUS

## 2020-04-18 MED ORDER — MAGIC MOUTHWASH: 10.0000 mL | Freq: Four times a day (QID) | ORAL | 0 refills | Status: AC

## 2020-04-18 MED ORDER — ETOMIDATE 2 MG/ML IV SOLN
INTRAVENOUS | Status: AC
  Administered 2020-04-18: 2 mg
  Filled 2020-04-18: qty 10

## 2020-04-18 MED ORDER — MAGIC MOUTHWASH: 10.0000 mL | Freq: Four times a day (QID) | ORAL | Status: DC

## 2020-04-18 MED ORDER — ASPIRIN 81 MG PO CHEW
81.0000 mg | CHEWABLE_TABLET | Freq: Every day | ORAL | Status: DC
  Administered 2020-04-18 – 2020-04-19 (×2): 81 mg
  Filled 2020-04-18: qty 1

## 2020-04-18 MED ORDER — FAMOTIDINE IN NACL 20-0.9 MG/50ML-% IV SOLN
20.0000 mg | INTRAVENOUS | Status: DC
  Administered 2020-04-18 – 2020-04-20 (×3): 20 mg via INTRAVENOUS
  Filled 2020-04-18 (×3): qty 50

## 2020-04-18 MED ORDER — ROCURONIUM BROMIDE 50 MG/5ML IV SOLN
INTRAVENOUS | Status: AC
  Administered 2020-04-18: 50 mg via INTRAVENOUS
  Filled 2020-04-18: qty 1

## 2020-04-18 MED ORDER — INSULIN ASPART 100 UNIT/ML ~~LOC~~ SOLN
0.0000 [IU] | SUBCUTANEOUS | Status: DC
  Administered 2020-04-18 – 2020-04-19 (×2): 2 [IU] via SUBCUTANEOUS
  Administered 2020-04-19: 3 [IU] via SUBCUTANEOUS
  Filled 2020-04-18 (×3): qty 1

## 2020-04-18 MED ORDER — SALINE SPRAY 0.65 % NA SOLN
2.0000 | NASAL | Status: DC
  Administered 2020-04-18: 11:00:00 2 via NASAL
  Filled 2020-04-18: qty 44

## 2020-04-18 MED ORDER — DOCUSATE SODIUM 50 MG/5ML PO LIQD
100.0000 mg | Freq: Two times a day (BID) | ORAL | Status: DC
  Administered 2020-04-18 – 2020-04-19 (×3): 100 mg
  Filled 2020-04-18 (×4): qty 10

## 2020-04-18 MED ORDER — FENTANYL CITRATE (PF) 100 MCG/2ML IJ SOLN
INTRAMUSCULAR | Status: AC
  Filled 2020-04-18: qty 2

## 2020-04-18 MED ORDER — MAGIC MOUTHWASH W/LIDOCAINE: 5.0000 mL | Freq: Four times a day (QID) | ORAL | Status: DC

## 2020-04-18 MED ORDER — FENTANYL CITRATE (PF) 100 MCG/2ML IJ SOLN
25.0000 ug | Freq: Once | INTRAMUSCULAR | Status: AC
  Administered 2020-04-18: 25 ug via INTRAVENOUS

## 2020-04-18 MED ORDER — ADULT MULTIVITAMIN LIQUID CH
15.0000 mL | Freq: Every day | ORAL | Status: DC
  Administered 2020-04-18 – 2020-04-19 (×2): 15 mL
  Filled 2020-04-18 (×4): qty 15

## 2020-04-18 MED ORDER — MIDAZOLAM HCL 2 MG/2ML IJ SOLN
INTRAMUSCULAR | Status: AC
  Administered 2020-04-18: 2 mg via INTRAVENOUS
  Filled 2020-04-18: qty 2

## 2020-04-18 MED ORDER — THIAMINE HCL 100 MG/ML IJ SOLN
500.0000 mg | Freq: Every day | INTRAVENOUS | Status: DC
  Administered 2020-04-20: 500 mg via INTRAVENOUS
  Filled 2020-04-18 (×4): qty 5

## 2020-04-18 MED ORDER — SODIUM CHLORIDE 0.9 % IV SOLN
250.0000 mL | INTRAVENOUS | Status: DC
  Administered 2020-04-18: 21:00:00 250 mL via INTRAVENOUS

## 2020-04-18 MED ORDER — MAGNESIUM SULFATE 2 GM/50ML IV SOLN
2.0000 g | Freq: Once | INTRAVENOUS | Status: AC
  Administered 2020-04-18: 2 g via INTRAVENOUS
  Filled 2020-04-18: qty 50

## 2020-04-18 MED ORDER — THIAMINE HCL 100 MG PO TABS: 100.0000 mg | ORAL_TABLET | Freq: Every day | ORAL | Status: DC

## 2020-04-18 MED ORDER — MAGIC MOUTHWASH W/LIDOCAINE: 10.0000 mL | Freq: Four times a day (QID) | ORAL | Status: DC

## 2020-04-18 MED ORDER — ETOMIDATE 2 MG/ML IV SOLN
20.0000 mg | Freq: Once | INTRAVENOUS | Status: AC
  Administered 2020-04-18: 20:00:00 20 mg via INTRAVENOUS

## 2020-04-18 MED ORDER — FENTANYL CITRATE (PF) 100 MCG/2ML IJ SOLN: 50.0000 ug | Freq: Once | INTRAMUSCULAR | Status: AC

## 2020-04-18 MED ORDER — PRO-STAT SUGAR FREE PO LIQD
30.0000 mL | Freq: Two times a day (BID) | ORAL | Status: DC
  Administered 2020-04-18 – 2020-04-19 (×2): 30 mL

## 2020-04-18 NOTE — Progress Notes (Signed)
Subjective: Patient scheduled for discharge today.  Prior to discharge noted to have generalized shaking.  Initially able to communicate but became more slurred and when evaluated by hospitalist felt to be a seizure.  Patient given 2mg  of Ativan.  Patient now sedated.  Continues to have tremors but no full body shaking noted.    Objective: Current vital signs: BP (!) 126/46   Pulse 100   Temp 99.4 F (37.4 C) (Axillary)   Resp (!) 28   Ht 5\' 8"  (1.727 m)   Wt 61.2 kg   SpO2 99%   BMI 20.53 kg/m  Vital signs in last 24 hours: Temp:  [98.4 F (36.9 C)-99.4 F (37.4 C)] 99.4 F (37.4 C) (05/10 1606) Pulse Rate:  [81-156] 100 (05/10 1606) Resp:  [19-33] 28 (05/10 1606) BP: (122-177)/(46-142) 126/46 (05/10 1606) SpO2:  [95 %-99 %] 99 % (05/10 1606)  Intake/Output from previous day: 05/09 0701 - 05/10 0700 In: 360 [P.O.:360] Out: 300 [Urine:300] Intake/Output this shift: No intake/output data recorded. Nutritional status:  Diet Order            Diet general        Diet - low sodium heart healthy        Diet regular Room service appropriate? Yes with Assist; Fluid consistency: Thin; Fluid restriction: 1500 mL Fluid  Diet effective now              Neurologic Exam: Mental Status: Lethargic but able to be aroused with light sternal rub.  Markedly dysarthric.  Able to follow simple commands.   Cranial Nerves: II: Visual fields grossly normal, pupils equal, round, reactive to light and accommodation III,IV, VI: ptosis not present, extra-ocular motions intact bilaterally V,VII: smile symmetric, facial light touch sensation normal bilaterally VIII: hearing normal bilaterally IX,X: gag reflex reduced XI: bilateral shoulder shrug XII: midline tongue extension Motor: Moves all extremities weakly with no focal weakness appreciated.   Sensory: Pinprick and light touch intact throughout, bilaterally  Lab Results: Basic Metabolic Panel: Recent Labs  Lab 04/27/2020 1641  04/15/20 0441  NA 129* 136  K 3.5 3.7  CL 93* 104  CO2 25 24  GLUCOSE 108* 91  BUN 15 12  CREATININE 0.93 0.73  CALCIUM 8.2* 7.6*    Liver Function Tests: Recent Labs  Lab 05/09/2020 1641  AST 164*  ALT 121*  ALKPHOS 61  BILITOT 2.0*  PROT 8.3*  ALBUMIN 3.5   No results for input(s): LIPASE, AMYLASE in the last 168 hours. Recent Labs  Lab 05/04/2020 2054  AMMONIA 10    CBC: Recent Labs  Lab 05/03/2020 1641 04/18/20 1600  WBC 7.1 10.3  HGB 12.9* 11.6*  HCT 38.1* 33.9*  MCV 96.2 94.2  PLT 165 151    Cardiac Enzymes: Recent Labs  Lab 04/28/2020 1641  CKTOTAL 61    Lipid Panel: Recent Labs  Lab 04/14/20 0609  CHOL 234*  TRIG 72  HDL 128  CHOLHDL 1.8  VLDL 14  LDLCALC 92    CBG: No results for input(s): GLUCAP in the last 168 hours.  Microbiology: Results for orders placed or performed during the hospital encounter of 04/12/2020  Respiratory Panel by RT PCR (Flu A&B, Covid) - Nasopharyngeal Swab     Status: None   Collection Time: 04/16/2020  7:41 PM   Specimen: Nasopharyngeal Swab  Result Value Ref Range Status   SARS Coronavirus 2 by RT PCR NEGATIVE NEGATIVE Final    Comment: (NOTE) SARS-CoV-2 target nucleic acids are  NOT DETECTED. The SARS-CoV-2 RNA is generally detectable in upper respiratoy specimens during the acute phase of infection. The lowest concentration of SARS-CoV-2 viral copies this assay can detect is 131 copies/mL. A negative result does not preclude SARS-Cov-2 infection and should not be used as the sole basis for treatment or other patient management decisions. A negative result may occur with  improper specimen collection/handling, submission of specimen other than nasopharyngeal swab, presence of viral mutation(s) within the areas targeted by this assay, and inadequate number of viral copies (<131 copies/mL). A negative result must be combined with clinical observations, patient history, and epidemiological information.  The expected result is Negative. Fact Sheet for Patients:  PinkCheek.be Fact Sheet for Healthcare Providers:  GravelBags.it This test is not yet ap proved or cleared by the [place] FDA and  has been authorized for detection and/or diagnosis of SARS-CoV-2 by FDA under an Emergency Use Authorization (EUA). This EUA will remain  in effect (meaning this test can be used) for the duration of the COVID-19 declaration under Section 564(b)(1) of the Act, 21 U.S.C. section 360bbb-3(b)(1), unless the authorization is terminated or revoked sooner.    Influenza A by PCR NEGATIVE NEGATIVE Final   Influenza B by PCR NEGATIVE NEGATIVE Final    Comment: (NOTE) The Xpert Xpress SARS-CoV-2/FLU/RSV assay is intended as an aid in  the diagnosis of influenza from Nasopharyngeal swab specimens and  should not be used as a sole basis for treatment. Nasal washings and  aspirates are unacceptable for Xpert Xpress SARS-CoV-2/FLU/RSV  testing. Fact Sheet for Patients: PinkCheek.be Fact Sheet for Healthcare Providers: GravelBags.it This test is not yet approved or cleared by the [place] FDA and  has been authorized for detection and/or diagnosis of SARS-CoV-2 by  FDA under an Emergency Use Authorization (EUA). This EUA will remain  in effect (meaning this test can be used) for the duration of the  Covid-19 declaration under Section 564(b)(1) of the Act, 21  U.S.C. section 360bbb-3(b)(1), unless the authorization is  terminated or revoked. Performed at [facility], [place], [place] [address]   SARS CORONAVIRUS 2 (TAT 6-24 HRS) Nasopharyngeal Nasopharyngeal Swab     Status: None   Collection Time: 04/17/20  1:27 PM   Specimen: Nasopharyngeal Swab  Result Value Ref Range Status   SARS Coronavirus 2 NEGATIVE NEGATIVE Final    Comment: (NOTE) SARS-CoV-2  target nucleic acids are NOT DETECTED. The SARS-CoV-2 RNA is generally detectable in upper and lower respiratory specimens during the acute phase of infection. Negative results do not preclude SARS-CoV-2 infection, do not rule out co-infections with other pathogens, and should not be used as the sole basis for treatment or other patient management decisions. Negative results must be combined with clinical observations, patient history, and epidemiological information. The expected result is Negative. Fact Sheet for Patients: SugarRoll.be Fact Sheet for Healthcare Providers: https://www.woods-mathews.com/ This test is not yet approved or cleared by the [place] FDA and  has been authorized for detection and/or diagnosis of SARS-CoV-2 by FDA under an Emergency Use Authorization (EUA). This EUA will remain  in effect (meaning this test can be used) for the duration of the COVID-19 declaration under Section 56 4(b)(1) of the Act, 21 U.S.C. section 360bbb-3(b)(1), unless the authorization is terminated or revoked sooner. Performed at [facility], [address], [place], [place] [address]     Coagulation Studies: No results for input(s): LABPROT, INR in the last 72 hours.  Imaging: CT  HEAD WO CONTRAST  Result Date: 04/18/2020 CLINICAL DATA:  Recent seizure-like activity EXAM: CT HEAD WITHOUT CONTRAST TECHNIQUE: Contiguous axial images were obtained from the base of the skull through the vertex without intravenous contrast. COMPARISON:  04/14/2011 FINDINGS: Brain: Mild atrophic changes are identified as are mild chronic white matter ischemic change. A stable colloid cyst is noted just to the left of the foramen of [place] measuring approximately 16 mm. No new focal mass lesion is seen. No findings to suggest acute hemorrhage or acute infarction are noted. Vascular: No hyperdense vessel or unexpected calcification. Skull: Normal. Negative for  fracture or focal lesion. Sinuses/Orbits: Mild mucosal thickening is noted in the left maxillary antrum. This is chronic in nature and stable from the previous exam. Other: None IMPRESSION: Chronic atrophic and ischemic changes. 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 Electronically Signed   By: Inez Catalina M.D.   On: 04/18/2020 15:28   DG Chest Port 1 View  Result Date: 04/18/2020 CLINICAL DATA:  Cough EXAM: PORTABLE CHEST 1 VIEW COMPARISON:  04/15/2020 FINDINGS: Left basilar scarring or atelectasis. Right lung clear. Heart is normal size. No effusions or acute bony abnormality. Old healed proximal right humeral fracture with deformity, stable. IMPRESSION: Left base scarring or atelectasis. Electronically Signed   By: Rolm Baptise M.D.   On: 04/18/2020 09:37    Medications:  I have reviewed the patient's current medications. Scheduled: .  stroke: mapping our early stages of recovery book   Does not apply Once  . vitamin C  500 mg Oral Daily  . aspirin EC  81 mg Oral Daily  . dexamethasone  0.25 mg Oral Daily  . dextromethorphan-guaiFENesin  2 tablet Oral BID  . enoxaparin (LOVENOX) injection  40 mg Subcutaneous Q24H  . feeding supplement (ENSURE ENLIVE)  237 mL Oral BID BM  . lidocaine  15 mL Mouth/Throat QID   And  . magic mouthwash  10 mL Oral QID  . LORazepam  1 mg Intravenous Once  . multivitamin with minerals  1 tablet Oral Daily  . sodium chloride  2 spray Each Nare Q4H    Assessment/Plan: 79 year old male with a known history ofhypertension, osteoarthritis, alcohol use and C. difficile colitis, who was initially admitted with ataxia and tremor.  Felt to be metabolic in etiology.  Patient scheduled for discharge today.  Noted to have what was felt to be a generalized tonic-clonic seizure.  Patient given Ativan and now sedated.  Speech dysarthric but no other focality noted.  Patient with a recent MRI of the brain on 5/5 that was only significant for some atrophy and  small vessel ischemic changes.  Head CT without contrast performed today and personally reviewed.  It shows no acute changes.  Patient outside window for seizure related to DT's. Although seizure may still be ETOH related.  There was concern initially for thiamine deficiency as well.    Recommendations: 1. Seizure precautions 2. Telemetry 3. HOB elevated due to fact that patient is not handling secretions 4. O2 monitor 5. EEG 6. Keppra 100mg  IV now with maintenance of 500mg  IV q 12 hours until patient able to take po when would change to 500mg  BID po.   7. Neuro checks q 2 hours 8. Recommend transfer to step down.  Dr. Benny Lennert contacted.   9. If no improvement in neurological examination in AM would repeat MRI of the brain without contrast 10. Thiamine 500mg  IV daily 11. Routine lab work with thiamine level  LOS: 4 days   Alexis Goodell, MD Neurology [phone] 04/18/2020  4:25 PM

## 2020-04-18 NOTE — Progress Notes (Signed)
[facility] responded to RR @ 2pm; when [facility] arrived, RR team attending to pt.; pt.'s son Henry Mays in rm.  Son said pt. said he was feeling 'strange' and 'needed to lie down'.  Pt. began to convulse and son pushed call button several times and yelled for help; pt. does not have history of tremors/seizures, acc. to son.  Son said pt. was originally scheduled for discharge today.  Son called pt.'s other son, who arrived shortly afterward.  Second son told [place] pt.'s wife (their mother) died 25mo ago; sons have not seen their father in person since funeral due to [place]; son grew tearful in describing his father's loneliness; said fr. has had a history of alcohol abuse and that this hospitalization was related to this.  MD arrived after some time --> said plan is to send pt. to CT to evaluate reason for seizures; MD suspects alcohol withdrawal as possible reason.  No needs expressed by sons or medical team at this time; [facility] remains available.

## 2020-04-18 NOTE — Progress Notes (Addendum)
RN coming back from lunch break when nurse tech ran up to RN and RN to come to pt room because pt was having seizure-like activity. Another RN present at bedside, taking pt vitals and son also at bedside. Pt alert and able to answer questions appropriately while having full-body jerking movements and grasping for staff members. PRNs administered and MD paged. Total of 2mg  of ativan administered and 20mg  of labetalol.

## 2020-04-18 NOTE — Procedures (Signed)
Endotracheal Intubation Procedure Note  Indication for endotracheal intubation: airway compromise. Airway Assessment: Mallampati Class: II (hard and soft palate, upper portion of tonsils anduvula visible). Sedation: etomidate and fentanyl. Paralytic: rocuronium. Lidocaine: no. Atropine: no. Equipment: glidescope utilized . Cricoid Pressure: no. Number of attempts: 1. ETT location confirmed by by auscultation, by CXR and ETCO2 monitor.  Henry Mays, [place] Pager [phone] (please enter 7 digits) PCCM Consult Pager [phone] (please enter 7 digits)

## 2020-04-18 NOTE — Telephone Encounter (Signed)
Verbal order can be given. Please try to contact the patient to let him know that they are trying to reach him.

## 2020-04-18 NOTE — Telephone Encounter (Signed)
Henry Mays from [facility], [phone], option 2. She needs a verbal order for home health care starting on May 13th. [facility] can not reach patient.

## 2020-04-18 NOTE — Significant Event (Signed)
Rapid Response Event Note  Overview:  RR paged at 1350, patient RN also called me on way to room to update me as to what was going on with patient. Full body convulsions, prepared for discharge before event so no IV was currently in place. Dr. Benny Lennert had already been notified of patient change of condition and was made aware that a RR was going to be called.    Initial Focused Assessment: Patient full body convulsions, remained able to communicate. Able to state his name and where he was at. Speech garbled and slurred, possibly due to convulsions as well as tachypnea. Patient very warm to touch, profusely sweating. Per monitor, HR sustaining 150's. RN again stated she had spoken to Dr. Benny Lennert and alerted her of situation. I attempted to page Dr. Benny Lennert again with no return phone call. I asked supervisor to attempt to get in touch with a hospitalist to respond to rapid response as we needed medical guidance. No return phone calls. Dr. Benny Lennert did come to bedside, stat CT head ordered. I accompanied patient to CT with cardiac monitoring in place.  Interventions: Total of 2mg  of Ativan administered. 10 mg of Labetalol administered. These were already ordered and on MAR. Responded well to Ativan, resting comfortably after head CT and returned to room. Plan of Care (if not transferred): Dr. Benny Lennert assessed patient in person. Medication orders followed. CT scan completed. Returned patient to room, VSS, resting comfortably. Informed RN to repage or call me if further assistance was needed.  Event Summary: Name of Physician Notified: Dr. Benny Lennert at 1350    at    Outcome: Stayed in room and stabalized  Event End Time: [place]

## 2020-04-18 NOTE — NC FL2 (Signed)
[place] LEVEL OF CARE SCREENING TOOL     IDENTIFICATION  Patient Name: Henry Mays Birthdate: [birth year] Sex: male Admission Date (Current Location): 05/02/2020  [place] and [place] Number:  Engineering geologist and Address:  [facility], [address], [place] [address]      Provider Number: [id number]  Attending Physician Name and Address:  Karie Kirks, DO  Relative Name and Phone Number:  Ladaris Besse [phone]    Current Level of Care: Hospital Recommended Level of Care: [place] Prior Approval Number:    Date Approved/Denied:   PASRR Number: [id number]  Discharge Plan: SNF    Current Diagnoses: Patient Active Problem List   Diagnosis Date Noted  . Protein-calorie malnutrition, severe 04/15/2020  . Unable to walk 04/14/2020  . Balance problem 04/25/2020  . Ataxia 05/01/2020  . Cerumen impaction 08/28/2019  . Personal history of fall 08/28/2019  . Skin cancer 08/03/2019  . Elevated PSA 07/04/2018  . Mouth sores 07/04/2018  . Dental anomaly 04/01/2018  . Plantar wart of right foot 05/14/2017  . PVC (premature ventricular contraction) 08/28/2016  . Intractable hiccups 08/07/2016  . Colloid cyst of brain ([place]) 05/22/2016  . Osteoporosis 05/22/2016  . Decreased right shoulder range of motion 04/10/2016  . MGUS (monoclonal gammopathy of unknown significance) 04/10/2016    Orientation RESPIRATION BLADDER Height & Weight     Self, Time, Situation, Place  Normal Continent Weight: 61.2 kg Height:  5\' 8"  (172.7 cm)  BEHAVIORAL SYMPTOMS/MOOD NEUROLOGICAL BOWEL NUTRITION STATUS      Continent Diet(see discharge summary)  AMBULATORY STATUS COMMUNICATION OF NEEDS Skin   Supervision Verbally Normal                       Personal Care Assistance Level of Assistance  Bathing, Feeding, Dressing Bathing Assistance: Limited assistance Feeding assistance: Independent Dressing Assistance:  Limited assistance     Functional Limitations Info             SPECIAL CARE FACTORS FREQUENCY  PT (By licensed PT), OT (By licensed OT)     PT Frequency: 3-5 times per week OT Frequency: 2 times per week            Contractures Contractures Info: Not present    Additional Factors Info  Code Status, Allergies Code Status Info: Full Allergies Info: None           Current Medications (04/18/2020):  This is the current hospital active medication list Current Facility-Administered Medications  Medication Dose Route Frequency Provider Last Rate Last Admin  .  stroke: mapping our early stages of recovery book   Does not apply Once Mansy, Jan A, MD      . 0.9 %  sodium chloride infusion   Intravenous Continuous Mansy, Arvella Merles, MD   Stopped at 04/16/20 [phone]  . acetaminophen (TYLENOL) tablet 650 mg  650 mg Oral Q4H PRN Mansy, Arvella Merles, MD       Or  . acetaminophen (TYLENOL) 160 MG/5ML solution 650 mg  650 mg Per Tube Q4H PRN Mansy, Arvella Merles, MD       Or  . acetaminophen (TYLENOL) suppository 650 mg  650 mg Rectal Q4H PRN Mansy, Jan A, MD      . ascorbic acid (VITAMIN C) tablet 500 mg  500 mg Oral Daily Swayze, Ava, DO   500 mg at 04/18/20 0905  . aspirin EC tablet 81 mg  81 mg Oral  Daily Mansy, Jan A, MD   81 mg at 04/18/20 0905  . dexamethasone (DECADRON) 1 MG/ML solution 0.25 mg  0.25 mg Oral Daily Mansy, Jan A, MD   0.25 mg at 04/18/20 0905  . dextromethorphan-guaiFENesin (MUCINEX DM) 30-600 MG per 12 hr tablet 2 tablet  2 tablet Oral BID Swayze, Ava, DO      . enoxaparin (LOVENOX) injection 40 mg  40 mg Subcutaneous Q24H Mansy, Jan A, MD   40 mg at 04/18/20 [id number]  . feeding supplement (ENSURE ENLIVE) (ENSURE ENLIVE) liquid 237 mL  237 mL Oral BID BM Swayze, Ava, DO   237 mL at 04/18/20 0906  . folic acid (FOLVITE) tablet 1 mg  1 mg Oral Daily Leotis Pain, MD   1 mg at 04/18/20 0905  . guaiFENesin-dextromethorphan (ROBITUSSIN DM) 100-10 MG/5ML syrup 5 mL  5 mL Oral Q4H PRN Swayze,  Ava, DO      . ipratropium-albuterol (DUONEB) 0.5-2.5 (3) MG/3ML nebulizer solution 3 mL  3 mL Inhalation Q6H PRN Swayze, Ava, DO      . labetalol (NORMODYNE) injection 20 mg  20 mg Intravenous Q3H PRN Mansy, Jan A, MD   20 mg at 04/15/20 0511  . lidocaine (XYLOCAINE) 2 % viscous mouth solution 15 mL  15 mL Mouth/Throat QID Swayze, Ava, DO   15 mL at 04/18/20 [id number]   And  . magic mouthwash  10 mL Oral QID Swayze, Ava, DO   10 mL at 04/18/20 [id number]  . LORazepam (ATIVAN) injection 1 mg  1 mg Intravenous Q1H PRN Mansy, Jan A, MD      . multivitamin with minerals tablet 1 tablet  1 tablet Oral Daily Mansy, Jan A, MD   1 tablet at 04/18/20 0905  . phenol (CHLORASEPTIC) mouth spray 1 spray  1 spray Mouth/Throat PRN Swayze, Ava, DO      . senna-docusate (Senokot-S) tablet 1 tablet  1 tablet Oral QHS PRN Mansy, Jan A, MD      . sodium chloride (OCEAN) 0.65 % nasal spray 2 spray  2 spray Each Nare Q4H Swayze, Ava, DO         Discharge Medications: Please see discharge summary for a list of discharge medications.  Relevant Imaging Results:  Relevant Lab Results:   Additional Information SS# [id number]  Shelbie Hutching, RN

## 2020-04-18 NOTE — Progress Notes (Signed)
PT Cancellation Note  Patient Details Name: Henry Mays MRN: [id number] DOB: 1941-01-30   Cancelled Treatment:    Reason Eval/Treat Not Completed: Pain limiting ability to participate   Pt with sore throat and cough today.  Stated he feels bad and declined therapy interventions.     Chesley Noon 04/18/2020, 10:55 AM

## 2020-04-18 NOTE — Progress Notes (Addendum)
PROGRESS NOTE  Olanrewaju Ortel [id number] DOB: 1941/06/22 DOA: 05/04/2020 PCP: Leone Haven, MD  Brief History   Henry Mays  is a 79 y.o. Caucasian male with a known history of hypertension, osteoarthritis, alcohol abuse and C. difficile colitis, who presented to the emergency room with acute onset of ataxia and inability to ambulate.  He had a fall in January and a couple weeks ago.  Today he had to use his walker without he was feeling significantly weak in both legs and unable to ambulate.  He drinks a couple of beer per night not on a regular basis.  He lives by himself.  He denies any paresthesias or other focal muscle weakness.  No nausea or vomiting or diarrhea or abdominal pain.  No headache or dizziness or blurred vision.  Patient was noted to have intention tremors in the ER and when he stood up to walk he was unsteady and needed 2 person assist with a shuffling like gait.  Upon presentation to the emergency room, temperature was 99.4 and heart rate 103 with otherwise normal vital signs.  Blood pressure later on was 175/97 and later improved.  Labs revealed hyponatremia 129 with hypochloremia 93 and borderline potassium of 3.5.  AST was 164 and ALT 121 with total protein of 8.3 with albumin of 3.5.  Indirect bili was 1.6 and total bili 2 with direct bili of 0.4 and ammonia level 10.  CBC showed mild anemia.  COVID-19 PCR and influenza antigens came back negative.  Urine drug screen came back negative.  Alcohol levels less than 10.  Noncontrasted head CT scan revealed no acute intracranial abnormalities.  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 EKG showed normal sinus rhythm with a rate of 94 with slightly poor R wave progression.  The patient was given 500 mL IV normal saline bolus as well as IV thiamine and placed on CIWA protocol.  The patient will be admitted to an observation medical  monitored bed for further evaluation and management.  The patient has been evaluated by neurology. They have recommended thiamine 500 mg IV x 2 days followed by PT as outpatient.  The patient was evaluated by PT. Their recommendation was for discharge to home with home health PT. This was also the patient's desire. However, when the patient was being prepared for discharge, nursing found him to be very unsteady on his feet and unsafe to be left on his own. After speaking with the patient and the patient's son, it was determined that discharge would be cancelled and disposition would be changed to SNF, preferably within the patient's residential community in which he had previously resided in an independent living situation.  The patient was being prepared for discharge when he developed severe shaking, but for a time was able to converse with nursing. This was accompanied by elevated heart rate and blood pressure. The patient then became unresponsive and was convulsing. He was given IV labetalol and IV ativan. His blood pressure came down to less than 200 and his convulsions became more of a shaking. He was given another dose of ativan. His blood pressure came down to the 130's and the patient became calm and was able to communicate. Seizure precautions were put inplace and CIWA protocol was restarted.  He was sent for stat CT head without contrast. This was unchanged from previous.   Neurology has been consulted. They have loaded the  patient with Keppra and ordered EEG. They recommend repeat MRI brain in the am.  Consultants  . Neurology  Procedures  . None  Antibiotics   Anti-infectives (From admission, onward)   None     Subjective  The patient is now resting quietly. No new complaints. Sons are at bedside.  Objective   Vitals:  Vitals:   04/18/20 1453 04/18/20 1606  BP: 122/63 (!) 126/46  Pulse: (!) 101 100  Resp: (!) 28 (!) 28  Temp:  99.4 F (37.4 C)  SpO2: 97% 99%    Exam:  Constitutional:  . The patient is awake, alert, and oriented x 3. Still somewhat tremulous. Respiratory:  . No increased work of breathing. . No wheezes, rales, or rhonchi . No tactile fremitus Cardiovascular:  . Regular rate and rhythm . No murmurs, ectopy, or gallups. . No lateral PMI. No thrills. Abdomen:  . Abdomen is soft, non-tender, non-distended . No hernias, masses, or organomegaly . Normoactive bowel sounds.  Musculoskeletal:  . No cyanosis, clubbing, or edema Skin:  . No rashes, lesions, ulcers . palpation of skin: no induration or nodules Neurologic:  . CN 2-12 intact . Sensation all 4 extremities intact . Intention tremor present Psychiatric:  . Mental status o Mood: depressed o Affect: flat o Orientation to person, place, time  . judgment and insight appear intact  I have personally reviewed the following:   Today's Data  . Vitals  Imaging  . MRI brain: Atrophy and chronic smaall vessel disease. Colloid cyst at left foramen of Monro. Stable. No sign of obstructive hydrocephalus. BL carotid doppler: Atherosclerotic plaque at the carotid bulbs bilaterally. Estimated degree of stenosis in the internal carotid arteries is less than 50% bilaterally. Patent vertebral arteries with antegrade flow. .   Cardiology Data  . Echocardiogram: EF of 55-60%. Normal LV function. No regional wall motion abnormalities. Normal diastolic parameters. RV systolic function is normal. Normal PASP. No atrial level shunts. No intracardiac source of thrombus documented.  CXR: Scarring was seen at the left base with a questionable but minimal left pleural effusions.   Scheduled Meds: .  stroke: mapping our early stages of recovery book   Does not apply Once  . vitamin C  500 mg Oral Daily  . aspirin EC  81 mg Oral Daily  . dexamethasone  0.25 mg Oral Daily  . dextromethorphan-guaiFENesin  2 tablet Oral BID  . enoxaparin (LOVENOX) injection  40 mg Subcutaneous Q24H   . feeding supplement (ENSURE ENLIVE)  237 mL Oral BID BM  . lidocaine  15 mL Mouth/Throat QID   And  . magic mouthwash  10 mL Oral QID  . LORazepam  1 mg Intravenous Once  . multivitamin with minerals  1 tablet Oral Daily  . sodium chloride  2 spray Each Nare Q4H   Continuous Infusions: . sodium chloride Stopped (04/16/20 0937)  . folic acid (FOLVITE) IVPB    . [START ON 04/19/2020] levETIRAcetam    . thiamine injection      Active Problems:   Ataxia   Unable to walk   Protein-calorie malnutrition, severe   LOS: 4 days   A & P  ETOH withdrawal seizures: Pt is on seizure precautions. He is being transferred to step down status. He has been loaded and restarted on keppra. Neurology has been consulted. EEG planned. MRI brain in the am.  Ataxia with unsteady gait and recent recurrent falls and inability to ambulate: CVA ruled out. Neurology was consulted and has  ruled out parkinsons. He has suggested possibly Wernickes, and has recommended 2 days of high dose (500mg ) IV thiamine. This has been started. He also feels that electrolyte abnormalities are playing a role. These will continue to be addressed and evaluated. Transitions of care has been consulted to seek SNF placement for rehab for patient.  Alcohol abuse with elevated LFTs due to alcoholic hepatitis: Counsel alcohol rehab and cessation of use. Continue supplementation with folic acid, vitamins and high dose IV thiamine as recommended by neurology. As needed ativan is available for symptoms of withdrawal.  Hypokalemia: Supplement and monitor.  Mild hyponatremia and hypochloremia: Likely due to ETOH use. Will place on fluid restriction. Monitor..  Hypertensive urgency: Blood pressures remain elevated. No CVA, so need for permissive hypertension. Will add Norvasc and as needed hydralazine.  Hypoxia with activity: Likely chronic. Will recheck ambulatory SaO2 in the morning. I believe that the patient will need Oxygen for  discharge.  I have seen and examined this patient myself. I have spent 45 minutes in his evaluation and care.  DVT prophylaxis: Lovenox CODE STATUS: Full Code Family Communication: None available. Disposition: Patient is from home. Disposition at discharge anticipated to be to SNF for rehab.. Barriers to discharge: Arrangements for SNF placement. Barriers to discharge: Resolution of alcohol withdrawal and seizures as well as work up for seizures. Shatyra Becka, DO [facility] Direct contact: see [email or web address]  7PM-7AM contact night coverage as above 04/17/2020, 11:53 AM  LOS: 0 days

## 2020-04-18 NOTE — TOC Transition Note (Signed)
Transition of Care [facility]) - CM/SW Discharge Note   Patient Details  Name: Henry Mays MRN: [id number] Date of Birth: 1941/05/17  Transition of Care [facility]) CM/SW Contact:  Shelbie Hutching, RN Phone Number: 04/18/2020, 1:34 PM   Clinical Narrative:    Patient is cleared for discharge to skilled nursing rehab at the [facility].  Bedside RN will call report to [phone].  Patient's son Aaron Edelman will be transporting the patient.     Final next level of care: Skilled Nursing Facility Barriers to Discharge: Barriers Resolved   Patient Goals and CMS Choice   CMS Medicare.gov Compare Post Acute Care list provided to:: Patient Choice offered to / list presented to : Patient  Discharge Placement PASRR number recieved: 04/18/20            Patient chooses bed at: [facility] Patient to be transferred to facility by: Son Name of family member notified: Aaron Edelman Patient and family notified of of transfer: 04/18/20  Discharge Plan and Services     Post Acute Care Choice: [place] Arranged: PT, OT [facility] Agency: [place] ([place]) Date [facility] Agency Contacted: 04/16/20 Time [place]: 1307 Representative spoke with at [place]: [place] ([place]) Interventions     Readmission Risk Interventions No flowsheet data found.

## 2020-04-18 NOTE — Care Management Important Message (Signed)
Important Message  Patient Details  Name: Henry Mays MRN: [id number] Date of Birth: [birth year]   Medicare Important Message Given:  Yes     Shelbie Hutching, RN 04/18/2020, 1:36 PM

## 2020-04-18 NOTE — TOC Progression Note (Signed)
Transition of Care [facility]) - Progression Note    Patient Details  Name: Henry Mays MRN: [id number] Date of Birth: Apr 13, 1941  Transition of Care [facility]) CM/SW Contact  Shelbie Hutching, RN Phone Number: 04/18/2020, 10:18 AM  Clinical Narrative:     Patient reports that he feels lousy today, he has a cough, sore throat, and congestion.  COVID test competed yesterday with negative results.  Plan for discharge is skilled nursing rehab over at [facility].  Patient's son will transport.    Expected Discharge Plan: Skilled Nursing Facility Barriers to Discharge: Continued Medical Work up(chest x ray)  Expected Discharge Plan and Services Expected Discharge Plan: [place] Choice: [place] Living arrangements for the past 2 months: [place] Expected Discharge Date: 04/16/20                         HH Arranged: PT, OT HH Agency: [place] ([place]) Date [place]: 04/16/20 Time [place]: 1307 Representative spoke with at [place]: [place] ([place]) Interventions    Readmission Risk Interventions No flowsheet data found.

## 2020-04-18 NOTE — TOC Progression Note (Signed)
Transition of Care [facility]) - Progression Note    Patient Details  Name: Meki Sustaita MRN: [id number] Date of Birth: 01-29-1941  Transition of Care [facility]) CM/SW Contact  Shelbie Hutching, RN Phone Number: 04/18/2020, 3:20 PM  Clinical Narrative:    Patient will not be discharging today due to seizure like activity.  RNCM will cont to follow.  [place] [employer].    Expected Discharge Plan: Skilled Nursing Facility Barriers to Discharge: Barriers Resolved  Expected Discharge Plan and Services Expected Discharge Plan: [place] Choice: [place] Living arrangements for the past 2 months: [place] Expected Discharge Date: 04/18/20                         HH Arranged: PT, OT HH Agency: [place] ([place]) Date [place]: 04/16/20 Time [place]: 1307 Representative spoke with at [place]: [place] ([place]) Interventions    Readmission Risk Interventions No flowsheet data found.

## 2020-04-18 NOTE — Progress Notes (Signed)
OT Cancellation Note  Patient Details Name: Henry Mays MRN: [id number] DOB: 1941/04/12   Cancelled Treatment:    Reason Eval/Treat Not Completed: Patient declined, no reason specified. Pt has sore throat/cough and declined therapy services this date. Will follow up as available.   Dessie Coma, M.S. OTR/L  04/18/20, 11:00 AM

## 2020-04-18 NOTE — Consult Note (Addendum)
Name: Henry Mays MRN: [id number] DOB: [birth year]    ADMISSION DATE:  05/09/2020 CONSULTATION DATE: 04/18/2020  REFERRING MD : Dr. Benny Lennert  CHIEF COMPLAINT: Altered Mental Status   BRIEF PATIENT DESCRIPTION:  79 yo male with ETOH abuse hx admitted to the medsurg unit with ataxia, recurrent falls, inability to ambulate suspected secondary to metabolic derangement.  Required transfer to ICU 05/10 following development of seizure activity/tremors and subsequently required mechanical intubation for airway protection following administration of sedating medications  SIGNIFICANT EVENTS/STUDIES:  05/5: Pt admitted to the medsurg unit with ataxia, recurrent falls, and inability to ambulate 05/5: CT Head revealed no evidence for acute intracranial abnormality. 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 Atrophy and chronic small vessel ischemic change of the white matter 05/6: MR Brain revealed no acute intracranial abnormality. Colloid cyst positioned at the left foramen of Monro, stable from previous. Stable ventricular size without evidence for obstructive hydrocephalus. Underlying age-related cerebral atrophy with mild chronic small vessel ischemic disease. 05/6: US Carotid Bilateral revealed atherosclerotic plaque at the carotid bulbs bilaterally. Estimated degree of stenosis in the internal carotid arteries is less than 50% bilaterally. Patent vertebral arteries with antegrade flow 05/6: Echo revealed EF 55 to 60% 05/6: Neurology consulted suspected symptoms likely metabolic related in the setting of electrolyte imbalance. Recommended Thiamine 500 mg iv daily for 1-2 days; continue folate; check B-12 level; and PT/OT  05/7: Pt able to ambulate with rolling walker PT recommended home health PT  05/9: Initially planned to discharge pt home, however after hospitalist spoke with pts sons discharge to home canceled, and disposition changed to SNF placement 05/10: Rapid response called pt  noted to have seizure-like activity/tremors, but no full body shaking along with slurred speech suspected secondary to ETOH withdrawal symptoms; he received 2 mg of iv ativan and 10 mg of iv labetalol x1 dose; CT Head ordered, and pt transferred to the stepdown unit.  Discharge to [facility] canceled  05/10: CT Head revealed chronic atrophic and ischemic changes. Stable colloid cyst within the left lateral ventricle 05/10: Upon arrival to the stepdown unit pt developed severe agitation with tremors requiring 2 mg of versed and 50 mcg of fentanyl x1 dose.  Pt subsequently unable to protect airway due to excessive secretions and sedating medications requiring mechanical intubation PCCM team assumed care   HISTORY OF PRESENT ILLNESS:   This is a 79 yo male with a PMH of Near Syncope, MGUS, HTN, Cdiff, Basal Cell Carcinoma, ETOH Abuse, and Arthritis.  He presented to [facility] ER on 05/5 with acute onset of ataxia and inability to ambulate.  Per ER notes pt reported having a fall in Jan 2021, which at baseline he uses his walker at night only to prevent falls.  Pt reported 1 week prior to ER presentation his legs felt shaky; he had difficulty standing, therefore he started using his walker to ambulate during the day.  He lives alone at the [facility]. He does have a hx of ETOH abuse, which ER lab results revealed: Na+ 129, chloride 93, glucose 108, AST 164, ALT 121, and ammonia level 10.  Therefore, ER physician contacted hospitalist team for medsurg admission given pts need for MRI to r/o stroke, additional lab workup, and PT/OT evaluation to determine if pt safe to be discharged back to the independent living facility. Detailed hospital course outline above under significant events/studies.   PAST MEDICAL HISTORY :   has a past medical history of Arthritis, Basal  cell carcinoma, Chickenpox, History of Clostridium difficile infection, Hypertension, MGUS (monoclonal gammopathy of unknown  significance), and Near syncope.  has a past surgical history that includes Hernia repair and Excision basal cell carcinoma. Prior to Admission medications   Medication Sig Start Date End Date Taking? Authorizing Provider  dexamethasone 0.5 MG/5ML elixir TAKE 2 TEASPONFULS BY MOUTH 3 TIMES DAILY Patient taking differently: Take 0.25 mg by mouth daily.  12/10/19  Yes Leone Haven, MD  ascorbic acid (VITAMIN C) 500 MG tablet Take 1 tablet (500 mg total) by mouth daily. 04/16/20   Swayze, Ava, DO  aspirin EC 81 MG EC tablet Take 1 tablet (81 mg total) by mouth daily. 04/16/20   Swayze, Ava, DO  chlorproMAZINE (THORAZINE) 10 MG tablet Take 1 tablet (10 mg total) by mouth 3 (three) times daily as needed for hiccoughs. Patient not taking: Reported on 04/11/2020 09/25/19   Leone Haven, MD  dextromethorphan-guaiFENesin [facility] DM) 30-600 MG 12hr tablet Take 2 tablets by mouth 2 (two) times daily. 04/18/20   Swayze, Ava, DO  feeding supplement, ENSURE ENLIVE, (ENSURE ENLIVE) LIQD Take 237 mLs by mouth 2 (two) times daily between meals. 04/16/20   Swayze, Ava, DO  folic acid (FOLVITE) 1 MG tablet Take 1 tablet (1 mg total) by mouth daily. 04/16/20   Swayze, Ava, DO  guaiFENesin-dextromethorphan (ROBITUSSIN DM) 100-10 MG/5ML syrup Take 5 mLs by mouth every 4 (four) hours as needed for cough. 04/18/20   Swayze, Ava, DO  Ipratropium-Albuterol (COMBIVENT RESPIMAT) 20-100 MCG/ACT AERS respimat Inhale 1 puff into the lungs every 6 (six) hours. 04/16/20   Swayze, Ava, DO  magic mouthwash SOLN Take 10 mLs by mouth 4 (four) times daily. 04/18/20   Swayze, Ava, DO  Multiple Vitamin (MULTIVITAMIN WITH MINERALS) TABS tablet Take 1 tablet by mouth daily. 04/16/20   Swayze, Ava, DO  phenol (CHLORASEPTIC) 1.4 % LIQD Use as directed 1 spray in the mouth or throat as needed for throat irritation / pain. 04/17/20   Swayze, Ava, DO  sodium chloride (OCEAN) 0.65 % SOLN nasal spray Place 2 sprays into both nostrils every 4 (four)  hours. 04/18/20   Swayze, Ava, DO  thiamine 100 MG tablet Take 1 tablet (100 mg total) by mouth daily. 04/15/20   Swayze, Ava, DO   No Known Allergies  FAMILY HISTORY:  family history includes Arthritis in an other family member; CAD in his mother; Hypertension in an other family member; Leukemia in his father; Stroke in an other family member. SOCIAL HISTORY:  reports that he has quit smoking. He has never used smokeless tobacco. He reports previous alcohol use. He reports that he does not use drugs.  REVIEW OF SYSTEMS:   Unable to assess pt mechanically intubated   SUBJECTIVE:  Unable to assess pt mechanically intubated  VITAL SIGNS: Temp:  [98.4 F (36.9 C)-100.2 F (37.9 C)] 98.4 F (36.9 C) (05/10 1804) Pulse Rate:  [81-156] 91 (05/10 1804) Resp:  [19-33] 24 (05/10 1804) BP: (114-177)/(46-142) 155/87 (05/10 1804) SpO2:  [95 %-99 %] 99 % (05/10 1804)  PHYSICAL EXAMINATION: General: acutely ill appearing male, NAD resting in bed mechanically intubated  Neuro: sedated, intermittently agitated, bilateral pupils 1 mm reactive  HEENT: supple, no JVD  Cardiovascular: nsr, rrr, no RG  Lungs: rhonchi throughout, even, non labored  Abdomen: +BS x4, soft, non distended  Musculoskeletal: normal bulk and tone, no edema  Skin: scattered ecchymosis   Recent Labs  Lab 05/07/2020 1641 04/15/20 0441 04/18/20 1600  NA 129* 136 130*  K 3.5 3.7 3.3*  CL 93* 104 96*  CO2 25 24 25   BUN 15 12 14   CREATININE 0.93 0.73 0.79  GLUCOSE 108* 91 206*   Recent Labs  Lab 04/24/2020 1641 04/18/20 1600  HGB 12.9* 11.6*  HCT 38.1* 33.9*  WBC 7.1 10.3  PLT 165 151   DG Abd 1 View  Result Date: 04/18/2020 CLINICAL DATA:  Check gastric catheter placement EXAM: ABDOMEN - 1 VIEW COMPARISON:  09/29/2016 FINDINGS: Gastric catheter is now seen with the tip in the stomach. Proximal side port lies at the gastroesophageal junction. This could be advanced further into the stomach. The visualized upper  abdomen is unremarkable. IMPRESSION: Gastric catheter as described which could be advanced further into the stomach. Electronically Signed   By: Inez Catalina M.D.   On: 04/18/2020 19:56   CT HEAD WO CONTRAST  Result Date: 04/18/2020 CLINICAL DATA:  Recent seizure-like activity EXAM: CT HEAD WITHOUT CONTRAST TECHNIQUE: Contiguous axial images were obtained from the base of the skull through the vertex without intravenous contrast. COMPARISON:  04/14/2011 FINDINGS: Brain: Mild atrophic changes are identified as are mild chronic white matter ischemic change. A stable colloid cyst is noted just to the left of the foramen of Monro measuring approximately 16 mm. No new focal mass lesion is seen. No findings to suggest acute hemorrhage or acute infarction are noted. Vascular: No hyperdense vessel or unexpected calcification. Skull: Normal. Negative for fracture or focal lesion. Sinuses/Orbits: Mild mucosal thickening is noted in the left maxillary antrum. This is chronic in nature and stable from the previous exam. Other: None IMPRESSION: Chronic atrophic and ischemic changes. 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 Electronically Signed   By: Inez Catalina M.D.   On: 04/18/2020 15:28   DG Chest Port 1 View  Result Date: 04/18/2020 CLINICAL DATA:  Check endotracheal tube placement EXAM: PORTABLE CHEST 1 VIEW COMPARISON:  Film from earlier in the same day. FINDINGS: Cardiac shadow is stable. Aortic calcifications are again seen. Endotracheal tube is noted 6 cm above the carina in satisfactory position. The lungs are hyperinflated with mild left basilar scarring identified IMPRESSION: COPD with left basilar scarring. Endotracheal tube in satisfactory position. Electronically Signed   By: Inez Catalina M.D.   On: 04/18/2020 19:55   DG Chest Port 1 View  Result Date: 04/18/2020 CLINICAL DATA:  Cough EXAM: PORTABLE CHEST 1 VIEW COMPARISON:  04/15/2020 FINDINGS: Left basilar scarring or atelectasis.  Right lung clear. Heart is normal size. No effusions or acute bony abnormality. Old healed proximal right humeral fracture with deformity, stable. IMPRESSION: Left base scarring or atelectasis. Electronically Signed   By: Rolm Baptise M.D.   On: 04/18/2020 09:37    ASSESSMENT / PLAN:  Acute respiratory failure secondary to seizure-like activity and sedating medications  Mechanical intubation  Full vent support for now-vent settings reviewed and established  SBT once all parameters  VAP bundle implemented Prn bronchodilator therapy  Trend WBC and monitor fever curve   Hypotension secondary to sedating medications  Continuous telemetry monitoring for now  Continue aspirin Prn levophed gt to maintain map >65  Hyponatremia/hypomagnesia/hypokalemia Trend BMP  Replace electrolytes as indicated  Monitor UOP Continue NS @100  ml/hr   Anemia without obvious acute blood loss Trend CBC  Monitor for s/sx of bleeding and transfuse for hgb <7  Hyperglycemia  CBG's q4hrs  SSI  Hemoglobin A1c in the am   Seizure activity suspected secondary to ETOH withdrawal  Acute metabolic encephalopathy  Mechanical intubation pain/discomfort  Maintain RASS goal -1 to -2 for now  Precedex and fentanyl gtts to maintain RASS goal and for pain management Neurology consulted appreciate input-per recommendations neuro checks q2rs, continue thiamine 500 mg iv daily/trend thiamine levels, repeat EEG, 500 mg iv keppra q12hrs, and pt will likely need repeat MR Brain in the am  Continue seizure and aspiration precautions  Best Practice: VTE px: subq lovenox SUP px: iv pepcid Diet: TF's   Prior to mechanical intubation I spoke with pts sons who were tearful.  They requested to discuss pts code status and treatment plan along with goals of treatment.  They informed me they were finally allowed to see their father at the independent living facility 2 weeks ago (previously unable to see the pt due to COVID-19  restrictions).  According to pts sons they knew the pt an ETOH abuse hx, however when they visited their father 2 weeks ago they found multiple beer bottles along with empty large bottles of liquor in his trash can. Pts sons were quite tearful and stated "We didn't know it was that bad" referring to his alcohol intake.  They suspect due to the isolation and pts loneliness this may have caused their father to drink heavily.  Both sons wanted to discuss pts code status, and following a long discussion they wanted to proceed with mechanical intubation. But in the event the pt were to cardiac arrest they DID NOT want CPR; administration of ACLS medications; or defibrillation/ cardioversion. Therefore, per pts sons request code status changed to Limited Code.  They were both very appreciative of the care provided and all questions were answered.  Marda Stalker, [place] Pager [phone] (please enter 7 digits) PCCM Consult Pager [phone] (please enter 7 digits)

## 2020-04-19 ENCOUNTER — Inpatient Hospital Stay: Payer: Medicare Other

## 2020-04-19 DIAGNOSIS — J9601 Acute respiratory failure with hypoxia: Secondary | ICD-10-CM

## 2020-04-19 DIAGNOSIS — R270 Ataxia, unspecified: Secondary | ICD-10-CM

## 2020-04-19 DIAGNOSIS — R262 Difficulty in walking, not elsewhere classified: Secondary | ICD-10-CM

## 2020-04-19 LAB — GLUCOSE, CAPILLARY
Glucose-Capillary: 118 mg/dL — ABNORMAL HIGH (ref 70–99)
Glucose-Capillary: 124 mg/dL — ABNORMAL HIGH (ref 70–99)
Glucose-Capillary: 124 mg/dL — ABNORMAL HIGH (ref 70–99)
Glucose-Capillary: 144 mg/dL — ABNORMAL HIGH (ref 70–99)
Glucose-Capillary: 146 mg/dL — ABNORMAL HIGH (ref 70–99)
Glucose-Capillary: 185 mg/dL — ABNORMAL HIGH (ref 70–99)
Glucose-Capillary: 199 mg/dL — ABNORMAL HIGH (ref 70–99)
Glucose-Capillary: 78 mg/dL (ref 70–99)

## 2020-04-19 LAB — PHOSPHORUS
Phosphorus: 3 mg/dL (ref 2.5–4.6)
Phosphorus: 2.5 mg/dL (ref 2.5–4.6)

## 2020-04-19 LAB — CBC WITH DIFFERENTIAL/PLATELET
Abs Immature Granulocytes: 0.05 10*3/uL (ref 0.00–0.07)
Basophils Absolute: 0.1 10*3/uL (ref 0.0–0.1)
Basophils Relative: 0 %
Eosinophils Absolute: 0.1 10*3/uL (ref 0.0–0.5)
Eosinophils Relative: 0 %
HCT: 35 % — ABNORMAL LOW (ref 39.0–52.0)
Hemoglobin: 11.8 g/dL — ABNORMAL LOW (ref 13.0–17.0)
Immature Granulocytes: 0 %
Lymphocytes Relative: 13 %
Lymphs Abs: 1.5 10*3/uL (ref 0.7–4.0)
MCH: 32.6 pg (ref 26.0–34.0)
MCHC: 33.7 g/dL (ref 30.0–36.0)
MCV: 96.7 fL (ref 80.0–100.0)
Monocytes Absolute: 2.7 10*3/uL — ABNORMAL HIGH (ref 0.1–1.0)
Monocytes Relative: 23 %
Neutro Abs: 7.4 10*3/uL (ref 1.7–7.7)
Neutrophils Relative %: 64 %
Platelets: 201 10*3/uL (ref 150–400)
RBC: 3.62 MIL/uL — ABNORMAL LOW (ref 4.22–5.81)
RDW: 13.7 % (ref 11.5–15.5)
Smear Review: NORMAL
WBC: 11.8 10*3/uL — ABNORMAL HIGH (ref 4.0–10.5)
nRBC: 0 % (ref 0.0–0.2)

## 2020-04-19 LAB — BASIC METABOLIC PANEL
Anion gap: 8 (ref 5–15)
BUN: 18 mg/dL (ref 8–23)
CO2: 23 mmol/L (ref 22–32)
Calcium: 7.7 mg/dL — ABNORMAL LOW (ref 8.9–10.3)
Chloride: 101 mmol/L (ref 98–111)
Creatinine, Ser: 0.73 mg/dL (ref 0.61–1.24)
GFR calc Af Amer: >60 mL/min (ref >60)
GFR calc non Af Amer: >60 mL/min (ref >60)
Glucose, Bld: 165 mg/dL — ABNORMAL HIGH (ref 70–99)
Potassium: 3.7 mmol/L (ref 3.5–5.1)
Sodium: 132 mmol/L — ABNORMAL LOW (ref 135–145)

## 2020-04-19 LAB — MAGNESIUM
Magnesium: 2.4 mg/dL (ref 1.7–2.4)
Magnesium: 2.1 mg/dL (ref 1.7–2.4)

## 2020-04-19 LAB — HEMOGLOBIN A1C
Hgb A1c MFr Bld: 5.6 % (ref 4.8–5.6)
Mean Plasma Glucose: 114.02 mg/dL

## 2020-04-19 MED ORDER — VITAL AF 1.2 CAL PO LIQD
1000.0000 mL | ORAL | Status: DC
  Administered 2020-04-19: 16:00:00 1000 mL

## 2020-04-19 MED ORDER — ORAL CARE MOUTH RINSE
15.0000 mL | OROMUCOSAL | Status: DC
  Administered 2020-04-19 – 2020-04-20 (×5): 15 mL via OROMUCOSAL

## 2020-04-19 MED ORDER — INSULIN ASPART 100 UNIT/ML ~~LOC~~ SOLN
0.0000 [IU] | SUBCUTANEOUS | Status: DC
  Administered 2020-04-19 – 2020-04-20 (×4): 2 [IU] via SUBCUTANEOUS
  Filled 2020-04-19 (×4): qty 1

## 2020-04-19 MED ORDER — CHLORHEXIDINE GLUCONATE 0.12% ORAL RINSE (MEDLINE KIT)
15.0000 mL | Freq: Two times a day (BID) | OROMUCOSAL | Status: DC
  Administered 2020-04-19 – 2020-04-20 (×2): 15 mL via OROMUCOSAL

## 2020-04-19 NOTE — Progress Notes (Signed)
Subjective: Patient with no further noted seizure activity.  Became agitated overnight and after further sedation was unable to protect his airway.  Is now intubated.    Objective: Current vital signs: BP (!) 99/56 (BP Location: Left Arm)   Pulse 66   Temp 97.8 F (36.6 C) (Oral)   Resp 18   Ht 5\' 8"  (1.727 m)   Wt 59 kg   SpO2 99%   BMI 19.78 kg/m  Vital signs in last 24 hours: Temp:  [97.8 F (36.6 C)-100.2 F (37.9 C)] 97.8 F (36.6 C) (05/11 0800) Pulse Rate:  [57-156] 66 (05/11 0600) Resp:  [18-33] 18 (05/11 0600) BP: (79-177)/(46-142) 99/56 (05/11 0600) SpO2:  [95 %-100 %] 99 % (05/11 0600) FiO2 (%):  [21 %-40 %] 21 % (05/11 0745) Weight:  [59 kg] 59 kg (05/11 0451)  Intake/Output from previous day: No intake/output data recorded. Intake/Output this shift: Total I/O In: 675.3 [I.V.:525.3; IV Piggyback:150] Out: 650 [Urine:650] Nutritional status:  Diet Order            Diet general        Diet - low sodium heart healthy              Neurologic Exam: Mental Status: Patient does not respond to verbal stimuli.  Arouses with deep sternal rub.  Cranial Nerves: II: patient does not respond confrontation bilaterally, pupils right 2 mm, left 2 mm,and unreactive bilaterally III,IV,VI: Oculocephalic response absent bilaterally.  V,VII: corneal reflex reduced bilaterally  VIII: patient does not respond to verbal stimuli IX,X: gag reflex reduced, XI: trapezius strength unable to test bilaterally XII: tongue strength unable to test Motor: Moves all extremities weakly but against gravity   Lab Results: Basic Metabolic Panel: Recent Labs  Lab 04/21/2020 1641 04/12/2020 1641 04/15/20 0441 04/18/20 1600 04/18/20 1958 04/19/20 0630 04/19/20 0632  NA 129*  --  136 130*  --   --  132*  K 3.5  --  3.7 3.3*  --   --  3.7  CL 93*  --  104 96*  --   --  101  CO2 25  --  24 25  --   --  23  GLUCOSE 108*  --  91 206*  --   --  165*  BUN 15  --  12 14  --   --  18   CREATININE 0.93  --  0.73 0.79  --   --  0.73  CALCIUM 8.2*   < > 7.6* 8.2*  --   --  7.7*  MG  --   --   --  1.9 1.8 2.4  --   PHOS  --   --   --  2.0* 3.5 3.0  --    < > = values in this interval not displayed.    Liver Function Tests: Recent Labs  Lab 04/10/2020 1641 04/18/20 1600  AST 164* 24  ALT 121* 29  ALKPHOS 61 49  BILITOT 2.0* 1.3*  PROT 8.3* 7.4  ALBUMIN 3.5 3.1*   No results for input(s): LIPASE, AMYLASE in the last 168 hours. Recent Labs  Lab 05/06/2020 2054  AMMONIA 10    CBC: Recent Labs  Lab 04/18/2020 1641 04/18/20 1600 04/19/20 0632  WBC 7.1 10.3 11.8*  NEUTROABS  --   --  7.4  HGB 12.9* 11.6* 11.8*  HCT 38.1* 33.9* 35.0*  MCV 96.2 94.2 96.7  PLT 165 151 201    Cardiac Enzymes: Recent Labs  Lab  05/06/2020 1641  CKTOTAL 61    Lipid Panel: Recent Labs  Lab 04/14/20 0609  CHOL 234*  TRIG 72  HDL 128  CHOLHDL 1.8  VLDL 14  LDLCALC 92    CBG: Recent Labs  Lab 04/18/20 2149 04/18/20 2359 04/19/20 0156 04/19/20 0522 04/19/20 0717  GLUCAP 149* 185* 199* 146* 124*    Microbiology: Results for orders placed or performed during the hospital encounter of 04/21/2020  Respiratory Panel by RT PCR (Flu A&B, Covid) - Nasopharyngeal Swab     Status: None   Collection Time: 04/29/2020  7:41 PM   Specimen: Nasopharyngeal Swab  Result Value Ref Range Status   SARS Coronavirus 2 by RT PCR NEGATIVE NEGATIVE Final    Comment: (NOTE) SARS-CoV-2 target nucleic acids are NOT DETECTED. The SARS-CoV-2 RNA is generally detectable in upper respiratoy specimens during the acute phase of infection. The lowest concentration of SARS-CoV-2 viral copies this assay can detect is 131 copies/mL. A negative result does not preclude SARS-Cov-2 infection and should not be used as the sole basis for treatment or other patient management decisions. A negative result may occur with  improper specimen collection/handling, submission of specimen other than  nasopharyngeal swab, presence of viral mutation(s) within the areas targeted by this assay, and inadequate number of viral copies (<131 copies/mL). A negative result must be combined with clinical observations, patient history, and epidemiological information. The expected result is Negative. Fact Sheet for Patients:  PinkCheek.be Fact Sheet for Healthcare Providers:  GravelBags.it This test is not yet ap proved or cleared by the [place] FDA and  has been authorized for detection and/or diagnosis of SARS-CoV-2 by FDA under an Emergency Use Authorization (EUA). This EUA will remain  in effect (meaning this test can be used) for the duration of the COVID-19 declaration under Section 564(b)(1) of the Act, 21 U.S.C. section 360bbb-3(b)(1), unless the authorization is terminated or revoked sooner.    Influenza A by PCR NEGATIVE NEGATIVE Final   Influenza B by PCR NEGATIVE NEGATIVE Final    Comment: (NOTE) The Xpert Xpress SARS-CoV-2/FLU/RSV assay is intended as an aid in  the diagnosis of influenza from Nasopharyngeal swab specimens and  should not be used as a sole basis for treatment. Nasal washings and  aspirates are unacceptable for Xpert Xpress SARS-CoV-2/FLU/RSV  testing. Fact Sheet for Patients: PinkCheek.be Fact Sheet for Healthcare Providers: GravelBags.it This test is not yet approved or cleared by the [place] FDA and  has been authorized for detection and/or diagnosis of SARS-CoV-2 by  FDA under an Emergency Use Authorization (EUA). This EUA will remain  in effect (meaning this test can be used) for the duration of the  Covid-19 declaration under Section 564(b)(1) of the Act, 21  U.S.C. section 360bbb-3(b)(1), unless the authorization is  terminated or revoked. Performed at [facility], [place], [place] [address]   SARS  CORONAVIRUS 2 (TAT 6-24 HRS) Nasopharyngeal Nasopharyngeal Swab     Status: None   Collection Time: 04/17/20  1:27 PM   Specimen: Nasopharyngeal Swab  Result Value Ref Range Status   SARS Coronavirus 2 NEGATIVE NEGATIVE Final    Comment: (NOTE) SARS-CoV-2 target nucleic acids are NOT DETECTED. The SARS-CoV-2 RNA is generally detectable in upper and lower respiratory specimens during the acute phase of infection. Negative results do not preclude SARS-CoV-2 infection, do not rule out co-infections with other pathogens, and should not be used as the sole basis for treatment or other patient management decisions.  Negative results must be combined with clinical observations, patient history, and epidemiological information. The expected result is Negative. Fact Sheet for Patients: SugarRoll.be Fact Sheet for Healthcare Providers: https://www.woods-mathews.com/ This test is not yet approved or cleared by the [place] FDA and  has been authorized for detection and/or diagnosis of SARS-CoV-2 by FDA under an Emergency Use Authorization (EUA). This EUA will remain  in effect (meaning this test can be used) for the duration of the COVID-19 declaration under Section 56 4(b)(1) of the Act, 21 U.S.C. section 360bbb-3(b)(1), unless the authorization is terminated or revoked sooner. Performed at [facility], [place] [address], [place], [place] [address]     Coagulation Studies: No results for input(s): LABPROT, INR in the last 72 hours.  Imaging: DG Abd 1 View  Result Date: 04/18/2020 CLINICAL DATA:  Check gastric catheter placement EXAM: ABDOMEN - 1 VIEW COMPARISON:  09/29/2016 FINDINGS: Gastric catheter is now seen with the tip in the stomach. Proximal side port lies at the gastroesophageal junction. This could be advanced further into the stomach. The visualized upper abdomen is unremarkable. IMPRESSION: Gastric catheter as described which  could be advanced further into the stomach. Electronically Signed   By: Inez Catalina M.D.   On: 04/18/2020 19:56   CT HEAD WO CONTRAST  Result Date: 04/18/2020 CLINICAL DATA:  Recent seizure-like activity EXAM: CT HEAD WITHOUT CONTRAST TECHNIQUE: Contiguous axial images were obtained from the base of the skull through the vertex without intravenous contrast. COMPARISON:  04/14/2011 FINDINGS: Brain: Mild atrophic changes are identified as are mild chronic white matter ischemic change. A stable colloid cyst is noted just to the left of the foramen of Monro measuring approximately 16 mm. No new focal mass lesion is seen. No findings to suggest acute hemorrhage or acute infarction are noted. Vascular: No hyperdense vessel or unexpected calcification. Skull: Normal. Negative for fracture or focal lesion. Sinuses/Orbits: Mild mucosal thickening is noted in the left maxillary antrum. This is chronic in nature and stable from the previous exam. Other: None IMPRESSION: Chronic atrophic and ischemic changes. 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 Electronically Signed   By: Inez Catalina M.D.   On: 04/18/2020 15:28   DG Chest Port 1 View  Result Date: 04/18/2020 CLINICAL DATA:  Check endotracheal tube placement EXAM: PORTABLE CHEST 1 VIEW COMPARISON:  Film from earlier in the same day. FINDINGS: Cardiac shadow is stable. Aortic calcifications are again seen. Endotracheal tube is noted 6 cm above the carina in satisfactory position. The lungs are hyperinflated with mild left basilar scarring identified IMPRESSION: COPD with left basilar scarring. Endotracheal tube in satisfactory position. Electronically Signed   By: Inez Catalina M.D.   On: 04/18/2020 19:55   DG Chest Port 1 View  Result Date: 04/18/2020 CLINICAL DATA:  Cough EXAM: PORTABLE CHEST 1 VIEW COMPARISON:  04/15/2020 FINDINGS: Left basilar scarring or atelectasis. Right lung clear. Heart is normal size. No effusions or acute bony  abnormality. Old healed proximal right humeral fracture with deformity, stable. IMPRESSION: Left base scarring or atelectasis. Electronically Signed   By: Rolm Baptise M.D.   On: 04/18/2020 09:37    Medications:  I have reviewed the patient's current medications. Scheduled: .  stroke: mapping our early stages of recovery book   Does not apply Once  . vitamin C  500 mg Oral Daily  . aspirin  81 mg Per Tube Daily  . Chlorhexidine Gluconate Cloth  6 each Topical Daily  . dexamethasone  0.25 mg Per Tube Daily  . docusate  100 mg Per Tube BID  . enoxaparin (LOVENOX) injection  40 mg Subcutaneous Q24H  . feeding supplement (PRO-STAT SUGAR FREE 64)  30 mL Per Tube BID  . feeding supplement (VITAL HIGH PROTEIN)  1,000 mL Per Tube Q24H  . insulin aspart  0-15 Units Subcutaneous Q4H  . lidocaine  15 mL Mouth/Throat QID   And  . magic mouthwash  10 mL Oral QID  . LORazepam  1 mg Intravenous Once  . multivitamin  15 mL Per Tube Daily    Assessment/Plan: 79 year old male with a known history ofhypertension, osteoarthritis, alcohol use and C. difficile colitis, who was initially admitted with ataxia and tremor.  Felt to be metabolic in etiology.  Patient scheduled for discharge yesterday but noted to have what was felt to be a generalized tonic-clonic seizure prior to discharge.  Patient with a recent MRI of the brain on 5/5 that was only significant for some atrophy and small vessel ischemic changes.  Head CT without contrast performed yesterday showed no acute changes.  Patient outside window for seizure related to DT's. Although seizure may still be ETOH related.  There was concern initially for thiamine deficiency as well.  Patient now on Keppra and IV thiamine.  EEG reviewed and only significant for normal sleep.  No epileptiform activity is noted.  Magnesium and phosphorus are normal.    Recommendations: 1. Continue Thiamine and Keppra at current doses 2. Seizure precautions 3. Patient  scheduled for repeat MRI of the brain without contrast    LOS: 5 days   Alexis Goodell, MD Neurology [phone] 04/19/2020  10:28 AM

## 2020-04-19 NOTE — Progress Notes (Signed)
The Clinical status was relayed to family in detail.  Updated and notified of patients medical condition.  Patient with high probability of very low chance of meaningful recovery despite all aggressive and optimal medical therapy.   Family understands the situation.  They have consented and agreed to DNR Plan for re-assessment of SAT/SBT in 24 hrs  Family are satisfied with Plan of action and management. All questions answered  Additional CC time 32 mins   Kaylanie Capili Patricia Pesa, M.D.  [facility]  Medical Director [place] Director [facility] Cardio-Pulmonary Department

## 2020-04-19 NOTE — Progress Notes (Signed)
Nutrition Follow-up  DOCUMENTATION CODES:   Severe malnutrition in context of social or environmental circumstances  INTERVENTION:  Initiate Vital AF 1.2 Cal at 15 mL/hr and advance by 20 mL/hr every 8 hours to goal rate of 55 mL/hr (1320 mL goal daily volume). Provides 1584 kcal, 99 grams of protein, 1069 mL H2O daily.  Provide minimum free water flush of 30 mL Q4hrs to maintain tube patency.  Monitor magnesium, potassium, and phosphorus daily for at least 3 days, MD to replete as needed, as pt is at risk for refeeding syndrome given severe malnutrition.   NUTRITION DIAGNOSIS:   Severe Malnutrition related to social / environmental circumstances(inadequate oral intake, EtOH use) as evidenced by severe fat depletion, moderate muscle depletion, severe muscle depletion.  Ongoing.  GOAL:   Patient will meet greater than or equal to 90% of their needs  Progressing with initiation of tube feeds.  MONITOR:   PO intake, Supplement acceptance, Labs, Weight trends, Skin, I & O's  REASON FOR ASSESSMENT:   Malnutrition Screening Tool, Ventilator, Consult Enteral/tube feeding initiation and management  ASSESSMENT:   79 year old male with PMHx of HTN, osteoarthritis, hx C difficile colitis, EtOH use who is admitted with ataxia and recent recurrent falls, elevated LFTs due to alcoholic hepatitis.  -Patient was transferred to ICU on 5/10 following seizure activity/tremors and required mechanical intubation for airway protection following administration of sedating medications.  Patient is currently intubated on ventilator support MV: 8.1 L/min Temp (24hrs), Avg:98.6 F (37 C), Min:97.8 F (36.6 C), Max:100.2 F (37.9 C)  Propofol: N/A  Medications reviewed and include: vitamin C 500 mg daily, Decadron 0.25 mg daily per tube, Colace 100 mg BID, Novolog 0-15 units Q4hrs, liquid MVI daily per tube, famotidine, fentanyl gtt, Keppra, norepinephrine gtt at 7 mcg/min, thiamine 500 mg IV  daily.  Labs reviewed: CBG 118-146, Sodium 132. Potassium, Phosphorus, and Magnesium are WNL today.  Enteral Access: 14 Fr. OGT; per abdominal x-ray 5/10 tip is in stomach with side port at GE junction with recommendation to advance tube further into stomach; discussed with RN and plan is to advance OGT  Discussed with RN and on rounds. Patient was started on tube feeds per protocol.  Diet Order:   Diet Order            Diet general        Diet - low sodium heart healthy             EDUCATION NEEDS:   Education needs have been addressed  Skin:  Skin Assessment: Reviewed RN Assessment(ecchymosis)  Last BM:  04/18/2020 per chart  Height:   Ht Readings from Last 1 Encounters:  05/08/2020 5\' 8"  (1.727 m)   Weight:   Wt Readings from Last 1 Encounters:  04/19/20 59 kg   Ideal Body Weight:  70 kg  BMI:  Body mass index is 19.78 kg/m.  Estimated Nutritional Needs:   Kcal:  [id number] (PSU 2003b w/ MSJ 1290, Ve 8.1, Tmax 37.9)  Protein:  90-105 grams  Fluid:  1.7-19 L/day  Jacklynn Barnacle, MS, RD, LDN Pager number available on Amion

## 2020-04-19 NOTE — Telephone Encounter (Signed)
I called to give verbal orders and Henry Mays of home health stated that it was no longer needed because the patient was in ICU in the hospital and he is not doing well.  So verbal orders are not needed at this time.  Victorino Fatzinger,cma

## 2020-04-19 NOTE — Progress Notes (Signed)
eeg completed ° °

## 2020-04-19 NOTE — Procedures (Signed)
ELECTROENCEPHALOGRAM REPORT   Patient: Henry Mays       Room #: IC20A-AA EEG No. ID: [id number] Age: 79 y.o.        Sex: male Requesting Physician: Kasa Report Date:  04/19/2020        Interpreting Physician: Alexis Goodell  History: Daoud Vanthof is an 79 y.o. male with new onset seizure  Medications:  ASA, Decadron, Keppra, Thiamine, Fentanyl, Levophed  Conditions of Recording:  This is a 21 channel routine scalp EEG performed with bipolar and monopolar montages arranged in accordance to the international 10/20 system of electrode placement. One channel was dedicated to EKG recording.  The patient is in the intubated and sedated state.  Description:  The background activity is slow and poorly organized.  This activity is diffusely distributed with superimposed sleep transients that include symmetrical sleep spindles and vertex central sharp transients.  No epileptiform activity is noted.   Hyperventilation and intermittent photic stimulation were not performed.  IMPRESSION: Normal asleep electroencephalogram. There are no focal lateralizing or epileptiform features.   Alexis Goodell, MD Neurology [phone] 04/19/2020, 11:27 AM

## 2020-04-19 NOTE — Progress Notes (Signed)
PT Cancellation Note  Patient Details Name: Henry Mays MRN: [id number] DOB: 1941-06-28   Cancelled Treatment:    Reason Eval/Treat Not Completed: Medical issues which prohibited therapy Pt was transferred to CCU and is now sedated and on the vent.  He does have continue on transfer orders to follow with PT but is not appropriate at this time.  Plan to maintain on caseload and check-in for appropriateness.    Kreg Shropshire, DPT 04/19/2020, 12:52 PM

## 2020-04-19 NOTE — Progress Notes (Signed)
Pt was transported to MRI from CCU and back to CCU while on the vent

## 2020-04-19 NOTE — Progress Notes (Signed)
CRITICAL CARE NOTE  79 yo male with ETOH abuse hx admitted to the medsurg unit with ataxia, recurrent falls, inability to ambulate suspected secondary to metabolic derangement.  Required transfer to ICU 05/10 following development of seizure activity/tremors and subsequently required mechanical intubation for airway protection following administration of sedating medications  SIGNIFICANT EVENTS/STUDIES:  05/5: Pt admitted to the medsurg unit with ataxia, recurrent falls, and inability to ambulate 05/5: CT Head revealed no evidence for acute intracranial abnormality. 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 Atrophy and chronic small vessel ischemic change of the white matter 05/6: MR Brain revealed no acute intracranial abnormality. Colloid cyst positioned at the left foramen of Monro, stable from previous. Stable ventricular size without evidence for obstructive hydrocephalus. Underlying age-related cerebral atrophy with mild chronic small vessel ischemic disease. 05/6: US Carotid Bilateral revealed atherosclerotic plaque at the carotid bulbs bilaterally. Estimated degree of stenosis in the internal carotid arteries is less than 50% bilaterally. Patent vertebral arteries with antegrade flow 05/6: Echo revealed EF 55 to 60% 05/6: Neurology consulted suspected symptoms likely metabolic related in the setting of electrolyte imbalance. Recommended Thiamine 500 mg iv daily for 1-2 days; continue folate; check B-12 level; and PT/OT  05/7: Pt able to ambulate with rolling walker PT recommended home health PT  05/9: Initially planned to discharge pt home, however after hospitalist spoke with pts sons discharge to home canceled, and disposition changed to SNF placement 05/10: Rapid response called pt noted to have seizure-like activity/tremors, but no full body shaking along with slurred speech suspected secondary to ETOH withdrawal symptoms; he received 2 mg of iv ativan and 10 mg of iv labetalol x1  dose; CT Head ordered, and pt transferred to the stepdown unit.  Discharge to [facility] canceled  05/10: CT Head revealed chronic atrophic and ischemic changes. Stable colloid cyst within the left lateral ventricle 05/10: Upon arrival to the stepdown unit pt developed severe agitation with tremors requiring 2 mg of versed and 50 mcg of fentanyl x1 dose.  Pt subsequently unable to protect airway due to excessive secretions and sedating medications requiring mechanical i   CC  follow up respiratory failure  SUBJECTIVE Patient remains critically ill Prognosis is guarded   BP (!) 99/56 (BP Location: Left Arm)   Pulse 66   Temp 97.8 F (36.6 C) (Oral)   Resp 18   Ht 5' 8"  (1.727 m)   Wt 59 kg   SpO2 99%   BMI 19.78 kg/m    I/O last 3 completed shifts: In: -  Out: 100 [Urine:100] Total I/O In: 675.3 [I.V.:525.3; IV Piggyback:150] Out: 650 [Urine:650]  SpO2: 99 % O2 Flow Rate (L/min): 3 L/min FiO2 (%): 21 %  Estimated body mass index is 19.78 kg/m as calculated from the following:   Height as of this encounter: 5' 8"  (1.727 m).   Weight as of this encounter: 59 kg.  SIGNIFICANT EVENTS   REVIEW OF SYSTEMS  PATIENT IS UNABLE TO PROVIDE COMPLETE REVIEW OF SYSTEMS DUE TO SEVERE CRITICAL ILLNESS        PHYSICAL EXAMINATION:  GENERAL:critically ill appearing, +resp distress HEAD: Normocephalic, atraumatic.  EYES: Pupils equal, round, reactive to light.  No scleral icterus.  MOUTH: Moist mucosal membrane. NECK: Supple.  PULMONARY: +rhonchi, +wheezing CARDIOVASCULAR: S1 and S2. Regular rate and rhythm. No murmurs, rubs, or gallops.  GASTROINTESTINAL: Soft, nontender, -distended.  Positive bowel sounds.   MUSCULOSKELETAL: No swelling, clubbing, or edema.  NEUROLOGIC: obtunded, GCS<8 SKIN:intact,warm,dry  MEDICATIONS: I  have reviewed all medications and confirmed regimen as documented   CULTURE RESULTS   Recent Results (from the past 240 hour(s))  Respiratory  Panel by RT PCR (Flu A&B, Covid) - Nasopharyngeal Swab     Status: None   Collection Time: 05/08/2020  7:41 PM   Specimen: Nasopharyngeal Swab  Result Value Ref Range Status   SARS Coronavirus 2 by RT PCR NEGATIVE NEGATIVE Final    Comment: (NOTE) SARS-CoV-2 target nucleic acids are NOT DETECTED. The SARS-CoV-2 RNA is generally detectable in upper respiratoy specimens during the acute phase of infection. The lowest concentration of SARS-CoV-2 viral copies this assay can detect is 131 copies/mL. A negative result does not preclude SARS-Cov-2 infection and should not be used as the sole basis for treatment or other patient management decisions. A negative result may occur with  improper specimen collection/handling, submission of specimen other than nasopharyngeal swab, presence of viral mutation(s) within the areas targeted by this assay, and inadequate number of viral copies (<131 copies/mL). A negative result must be combined with clinical observations, patient history, and epidemiological information. The expected result is Negative. Fact Sheet for Patients:  PinkCheek.be Fact Sheet for Healthcare Providers:  GravelBags.it This test is not yet ap proved or cleared by the [place] FDA and  has been authorized for detection and/or diagnosis of SARS-CoV-2 by FDA under an Emergency Use Authorization (EUA). This EUA will remain  in effect (meaning this test can be used) for the duration of the COVID-19 declaration under Section 564(b)(1) of the Act, 21 U.S.C. section 360bbb-3(b)(1), unless the authorization is terminated or revoked sooner.    Influenza A by PCR NEGATIVE NEGATIVE Final   Influenza B by PCR NEGATIVE NEGATIVE Final    Comment: (NOTE) The Xpert Xpress SARS-CoV-2/FLU/RSV assay is intended as an aid in  the diagnosis of influenza from Nasopharyngeal swab specimens and  should not be used as a sole basis for  treatment. Nasal washings and  aspirates are unacceptable for Xpert Xpress SARS-CoV-2/FLU/RSV  testing. Fact Sheet for Patients: PinkCheek.be Fact Sheet for Healthcare Providers: GravelBags.it This test is not yet approved or cleared by the [place] FDA and  has been authorized for detection and/or diagnosis of SARS-CoV-2 by  FDA under an Emergency Use Authorization (EUA). This EUA will remain  in effect (meaning this test can be used) for the duration of the  Covid-19 declaration under Section 564(b)(1) of the Act, 21  U.S.C. section 360bbb-3(b)(1), unless the authorization is  terminated or revoked. Performed at [facility], [place], [place] [address]   SARS CORONAVIRUS 2 (TAT 6-24 HRS) Nasopharyngeal Nasopharyngeal Swab     Status: None   Collection Time: 04/17/20  1:27 PM   Specimen: Nasopharyngeal Swab  Result Value Ref Range Status   SARS Coronavirus 2 NEGATIVE NEGATIVE Final    Comment: (NOTE) SARS-CoV-2 target nucleic acids are NOT DETECTED. The SARS-CoV-2 RNA is generally detectable in upper and lower respiratory specimens during the acute phase of infection. Negative results do not preclude SARS-CoV-2 infection, do not rule out co-infections with other pathogens, and should not be used as the sole basis for treatment or other patient management decisions. Negative results must be combined with clinical observations, patient history, and epidemiological information. The expected result is Negative. Fact Sheet for Patients: SugarRoll.be Fact Sheet for Healthcare Providers: https://www.woods-mathews.com/ This test is not yet approved or cleared by the [place] FDA and  has been authorized for detection and/or diagnosis of SARS-CoV-2  by FDA under an Emergency Use Authorization (EUA). This EUA will remain  in effect (meaning this test can be  used) for the duration of the COVID-19 declaration under Section 56 4(b)(1) of the Act, 21 U.S.C. section 360bbb-3(b)(1), unless the authorization is terminated or revoked sooner. Performed at [facility], [place] [address], [place], [place] [address]           IMAGING    DG Abd 1 View  Result Date: 04/18/2020 CLINICAL DATA:  Check gastric catheter placement EXAM: ABDOMEN - 1 VIEW COMPARISON:  09/29/2016 FINDINGS: Gastric catheter is now seen with the tip in the stomach. Proximal side port lies at the gastroesophageal junction. This could be advanced further into the stomach. The visualized upper abdomen is unremarkable. IMPRESSION: Gastric catheter as described which could be advanced further into the stomach. Electronically Signed   By: Inez Catalina M.D.   On: 04/18/2020 19:56   CT HEAD WO CONTRAST  Result Date: 04/18/2020 CLINICAL DATA:  Recent seizure-like activity EXAM: CT HEAD WITHOUT CONTRAST TECHNIQUE: Contiguous axial images were obtained from the base of the skull through the vertex without intravenous contrast. COMPARISON:  04/14/2011 FINDINGS: Brain: Mild atrophic changes are identified as are mild chronic white matter ischemic change. A stable colloid cyst is noted just to the left of the foramen of Monro measuring approximately 16 mm. No new focal mass lesion is seen. No findings to suggest acute hemorrhage or acute infarction are noted. Vascular: No hyperdense vessel or unexpected calcification. Skull: Normal. Negative for fracture or focal lesion. Sinuses/Orbits: Mild mucosal thickening is noted in the left maxillary antrum. This is chronic in nature and stable from the previous exam. Other: None IMPRESSION: Chronic atrophic and ischemic changes. 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 Electronically Signed   By: Inez Catalina M.D.   On: 04/18/2020 15:28   DG Chest Port 1 View  Result Date: 04/18/2020 CLINICAL DATA:  Check endotracheal tube placement EXAM:  PORTABLE CHEST 1 VIEW COMPARISON:  Film from earlier in the same day. FINDINGS: Cardiac shadow is stable. Aortic calcifications are again seen. Endotracheal tube is noted 6 cm above the carina in satisfactory position. The lungs are hyperinflated with mild left basilar scarring identified IMPRESSION: COPD with left basilar scarring. Endotracheal tube in satisfactory position. Electronically Signed   By: Inez Catalina M.D.   On: 04/18/2020 19:55     Nutrition Status: Nutrition Problem: Severe Malnutrition Etiology: social / environmental circumstances(inadequate oral intake, EtOH use) Signs/Symptoms: severe fat depletion, moderate muscle depletion, severe muscle depletion Interventions: Refer to RD note for recommendations     Indwelling Urinary Catheter continued, requirement due to   Reason to continue Indwelling Urinary Catheter strict Intake/Output monitoring for hemodynamic instability         Ventilator continued, requirement due to severe respiratory failure   Ventilator Sedation RASS 0 to -2      ASSESSMENT AND PLAN SYNOPSIS   Severe ACUTE Hypoxic and Hypercapnic Respiratory Failure from acute SEIZURES AND ACUTE ENCEPHALOPATHY WITH INABILITY TO PROTECT AIRWAY WITH ACUTE ASPIRATION PNEUMONIA -continue Full MV support -continue Bronchodilator Therapy -Wean Fio2 and PEEP as tolerated -will perform SAT/SBT when respiratory parameters are met -VAP/VENT bundle implementation   NEUROLOGY - intubated and sedated - minimal sedation to achieve a RASS goal: -1 Await MRI and EEG ?ETOH WITHDRAWAL   CARDIAC ICU monitoring  ID -continue IV abx as prescibed -follow up cultures  GI GI PROPHYLAXIS as indicated  NUTRITIONAL STATUS Nutrition Status:  Nutrition Problem: Severe Malnutrition Etiology: social / environmental circumstances(inadequate oral intake, EtOH use) Signs/Symptoms: severe fat depletion, moderate muscle depletion, severe muscle depletion Interventions:  Refer to RD note for recommendations   DIET-->TF's as tolerated Constipation protocol as indicated  ENDO - will use ICU hypoglycemic\Hyperglycemia protocol if indicated   ELECTROLYTES -follow labs as needed -replace as needed -pharmacy consultation and following   DVT/GI PRX ordered TRANSFUSIONS AS NEEDED MONITOR FSBS ASSESS the need for LABS as needed   Critical Care Time devoted to patient care services described in this note is 32 minutes.   Overall, patient is critically ill, prognosis is guarded.  Patient with Multiorgan failure and at high risk for cardiac arrest and death.    Corrin Parker, M.D.  [facility]  Medical Director [place] Director [facility] Cardio-Pulmonary Department

## 2020-04-19 NOTE — Progress Notes (Signed)
PROGRESS NOTE  Jahmarley Cutrer [id number] DOB: [birth year] DOA: 05/08/2020 PCP: Leone Haven, MD  Brief History   Jermiah Lamping  is a 79 y.o. Caucasian male with a known history of hypertension, osteoarthritis, alcohol abuse and C. difficile colitis, who presented to the emergency room with acute onset of ataxia and inability to ambulate.  He had a fall in January and a couple weeks ago.  Today he had to use his walker without he was feeling significantly weak in both legs and unable to ambulate.  He drinks a couple of beer per night not on a regular basis.  He lives by himself.  He denies any paresthesias or other focal muscle weakness.  No nausea or vomiting or diarrhea or abdominal pain.  No headache or dizziness or blurred vision.  Patient was noted to have intention tremors in the ER and when he stood up to walk he was unsteady and needed 2 person assist with a shuffling like gait.  Upon presentation to the emergency room, temperature was 99.4 and heart rate 103 with otherwise normal vital signs.  Blood pressure later on was 175/97 and later improved.  Labs revealed hyponatremia 129 with hypochloremia 93 and borderline potassium of 3.5.  AST was 164 and ALT 121 with total protein of 8.3 with albumin of 3.5.  Indirect bili was 1.6 and total bili 2 with direct bili of 0.4 and ammonia level 10.  CBC showed mild anemia.  COVID-19 PCR and influenza antigens came back negative.  Urine drug screen came back negative.  Alcohol levels less than 10.  Noncontrasted head CT scan revealed no acute intracranial abnormalities.  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 EKG showed normal sinus rhythm with a rate of 94 with slightly poor R wave progression.  The patient was given 500 mL IV normal saline bolus as well as IV thiamine and placed on CIWA protocol.  The patient will be admitted to an observation medical  monitored bed for further evaluation and management.  The patient has been evaluated by neurology. They have recommended thiamine 500 mg IV x 2 days followed by PT as outpatient.  The patient was evaluated by PT. Their recommendation was for discharge to home with home health PT. This was also the patient's desire. However, when the patient was being prepared for discharge, nursing found him to be very unsteady on his feet and unsafe to be left on his own. After speaking with the patient and the patient's son, it was determined that discharge would be cancelled and disposition would be changed to SNF, preferably within the patient's residential community in which he had previously resided in an independent living situation.  The patient was being prepared for discharge when he developed severe shaking, but for a time was able to converse with nursing. This was accompanied by elevated heart rate and blood pressure. The patient then became unresponsive and was convulsing. He was given IV labetalol and IV ativan. His blood pressure came down to less than 200 and his convulsions became more of a shaking. He was given another dose of ativan. His blood pressure came down to the 130's and the patient became calm and was able to communicate. Seizure precautions were put inplace and CIWA protocol was restarted.  He was sent for stat CT head without contrast. This was unchanged from previous.   Neurology was consulted. They have loaded the patient  with Keppra and ordered EEG. They recommend repeat MRI brain in the am. Early evening of 04/18/2020 the patient was transferred to step down. Once there he again went into a tonic clonic seizure. The patient required intubation and mechanical ventilation to protect his airway due to seizures and the need for further benzodiazepines to stop seizures.  This morning after discussing the patient with Dr. Mortimer Fries the patient's sons decided to make him a DNR. Repeat MRI brain was  obtained. It did not demonstrate a potential cause for seizures.  Consultants  . Neurology . PCCM  Procedures  . Intubation . Mechanical ventilation  Antibiotics   Anti-infectives (From admission, onward)   None     Subjective  The patient is intubated and sedated. No acute distress. Sons at bedside.  Objective   Vitals:  Vitals:   04/19/20 1100 04/19/20 1200  BP: 118/61 (!) 152/76  Pulse: (!) 58 80  Resp: 18 20  Temp:  97.9 F (36.6 C)  SpO2: 99% 99%   Exam:  Constitutional:  The patient is intubated and sedated. No acute distress. Respiratory:  . No increased work of breathing. . No wheezes, rales, or rhonchi . No tactile fremitus Cardiovascular:  . Regular rate and rhythm . No murmurs, ectopy, or gallups. . No lateral PMI. No thrills. Abdomen:  . Abdomen is soft, non-tender, non-distended . No hernias, masses, or organomegaly . Normoactive bowel sounds.  Musculoskeletal:  . No cyanosis, clubbing, or edema Skin:  . No rashes, lesions, ulcers . palpation of skin: no induration or nodules Neurologic:  . The patient is unable to cooperate with exam Psychiatric:  . The patient is unable to cooperate with exam.  I have personally reviewed the following:   Today's Data  . VitalsBMP, CBC, phosphorus, magnesium  Imaging  . MRI brain: Atrophy and chronic smaall vessel disease. Colloid cyst at left foramen of Monro. Stable. No sign of obstructive hydrocephalus. BL carotid doppler: Atherosclerotic plaque at the carotid bulbs bilaterally. Estimated degree of stenosis in the internal carotid arteries is less than 50% bilaterally. Patent vertebral arteries with antegrade flow. Repeat MRI: 1. No acute intracranial abnormality identified. 2. 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 3. Mild  chronic small vessel ischemia and parenchymal volume loss. . 4. Paranasal sinus disease.  Cardiology Data  . Echocardiogram: EF of 55-60%. Normal LV function. No regional wall motion abnormalities. Normal diastolic parameters. RV systolic function is normal. Normal PASP. No atrial level shunts. No intracardiac source of thrombus documented.  CXR: Scarring was seen at the left base with a questionable but minimal left pleural effusions.   Scheduled Meds: .  stroke: mapping our early stages of recovery book   Does not apply Once  . vitamin C  500 mg Oral Daily  . aspirin  81 mg Per Tube Daily  . Chlorhexidine Gluconate Cloth  6 each Topical Daily  . dexamethasone  0.25 mg Per Tube Daily  . docusate  100 mg Per Tube BID  . enoxaparin (LOVENOX) injection  40 mg Subcutaneous Q24H  . insulin aspart  0-15 Units Subcutaneous Q4H  . LORazepam  1 mg Intravenous Once  . multivitamin  15 mL Per Tube Daily   Continuous Infusions: . sodium chloride Stopped (04/16/20 0937)  . sodium chloride Stopped (04/19/20 0725)  . famotidine (PEPCID) IV Stopped (04/18/20 2225)  . feeding supplement (  VITAL AF 1.2 CAL) 1,000 mL (04/19/20 1628)  . fentaNYL infusion INTRAVENOUS Stopped (04/19/20 1120)  . levETIRAcetam Stopped (04/19/20 0550)  . norepinephrine (LEVOPHED) Adult infusion 7 mcg/min (04/19/20 1200)  . thiamine injection      Active Problems:   Ataxia   Unable to walk   Protein-calorie malnutrition, severe   Difficulty walking   Acute respiratory failure with hypoxia (HCC)   LOS: 5 days   A & P  ETOH withdrawal seizures: Pt is on seizure precautions. He is being transferred to step down status. He has been loaded and restarted on keppra. Neurology has been consulted. EEG planned. Repeat MRI brain in the am does not demonstrate a cause for the patient's seizures. EEG is pending. High dose IV thiamine and keppra have been continued. Seizures may be alcohol related even though they are outside of  the window of alcohol withdrawal seizures.  Ataxia with unsteady gait and recent recurrent falls and inability to ambulate: CVA ruled out. Neurology was consulted and has ruled out parkinsons. He has suggested possibly Wernickes, and has recommended 2 days of high dose (500mg ) IV thiamine. This has been started. He also feels that electrolyte abnormalities are playing a role. These will continue to be addressed and evaluated. Transitions of care has been consulted to seek SNF placement for rehab for patient.  Alcohol abuse with elevated LFTs due to alcoholic hepatitis: Counsel alcohol rehab and cessation of use. Continue supplementation with folic acid, vitamins and high dose IV thiamine as recommended by neurology. As needed ativan is available for symptoms of withdrawal.  Hypokalemia: Supplement and monitor.  Hypophosphatemia: Supplement and monitor  Hypomagnesemia: Supplement and monitor.  Mild hyponatremia and hypochloremia: Likely due to ETOH use. Will place on fluid restriction. Monitor..  Hypertensive urgency: Blood pressures controlled. No CVA, so no need for permissive hypertension.  Hypoxia with activity: Hypoxia resolved.  I have seen and examined this patient myself. I have spent 34 minutes in his evaluation and care.  DVT prophylaxis: Lovenox CODE STATUS: Full Code Family Communication: None available. Disposition: Patient is from home. Disposition at discharge anticipated to be to SNF for rehab.. Barriers to discharge: Arrangements for SNF placement. Barriers to discharge: Resolution of alcohol withdrawal and seizures as well as work up for seizures. Samuele Storey, DO [facility] Direct contact: see [email or web address]  7PM-7AM contact night coverage as above 04/19/2020, 5:36 PM  LOS: 0 days

## 2020-04-19 NOTE — Progress Notes (Signed)
OT Cancellation Note  Patient Details Name: Henry Mays MRN: [id number] DOB: 1941-10-07   Cancelled Treatment:    Reason Eval/Treat Not Completed: Medical issues which prohibited therapy. Chart reviewed. Pt noted to have been recently intubated. Will require new OT orders 2/2 this change in medical status. Will complete current orders at this time. Please re-consult once pt medically appropriate for OT tx.   Shara Blazing, M.S., OTR/L Ascom: [phone] 04/19/20, 8:31 AM

## 2020-04-19 NOTE — Progress Notes (Signed)
[facility] for Electrolyte Monitoring and Replacement   Recent Labs: Potassium (mmol/L)  Date Value  04/19/2020 3.7   Magnesium (mg/dL)  Date Value  04/19/2020 2.4   Calcium (mg/dL)  Date Value  04/19/2020 7.7 (L)   Albumin (g/dL)  Date Value  04/18/2020 3.1 (L)   Phosphorus (mg/dL)  Date Value  04/19/2020 3.0   Sodium (mmol/L)  Date Value  04/19/2020 132 (L)     Assessment: 79 year old male transferred to ICU, required intubation. Patient remains intubated and sedated. Neurology following, patient on Keppra and high dose thiamine. Pharmacy to manage electrolytes. Patient is at risk for refeeding syndrome.  Goal of Therapy:  Electrolytes WNL  Plan:  No replacement today. Will follow up with morning labs.  Tawnya Crook ,PharmD Clinical Pharmacist 04/19/2020 1:00 PM

## 2020-04-19 NOTE — Progress Notes (Addendum)
9:50am - CH encountered pt.'s two sons in ICU hallway --> had met them previously yesterday following RR for pt. on 1C.  Sons shared pt. is devout Catholic --> inquired re: possibility of having priest come administer last rites.  CH will follow up after ICU rounds and after sons have spoken w/medical team re: pt.'s prognosis.  11:10am - Sons would like priest to come ASAP; [facility] contacted [facility] in [place] to arrange visit from Fr. Eddie Dibbles.  1pm - Fr. Eddie Dibbles arrived to administer last rites for pt.  Sons @ bedside; sons shared pt.'s wife died little over 51yr ago [place]  Sons shared having difficulty w/suddenness of pt.'s decline in comparison to having time to make plans when their mother's health was deteriorating.  Fr. PEddie Dibblesadvised sons to use arrangements made for their mother as guide for funeral arrangements for pt.  Sons remained @ bedside during ritual, both tearful and experiencing anticipatory grief appropriately.  After prayer, sons asked Fr. PEddie Dibblesabout Catholic position re: discontinuation of life support.  Fr. PEddie Dibbleshelped distinguish between extraordinary and ordinary means of life support and explained that discontinuing extraordinary support is acceptable under [place].  Sons expressed that having this input was very helpful.  [place] Fr. PEddie Dibblesout of ICU; son BAaron Edelmankneeling and tearful @ bedside [facility]

## 2020-04-20 ENCOUNTER — Encounter: Payer: Self-pay | Admitting: Internal Medicine

## 2020-04-20 ENCOUNTER — Inpatient Hospital Stay: Payer: Medicare Other

## 2020-04-20 LAB — GLUCOSE, CAPILLARY
Glucose-Capillary: 105 mg/dL — ABNORMAL HIGH (ref 70–99)
Glucose-Capillary: 106 mg/dL — ABNORMAL HIGH (ref 70–99)
Glucose-Capillary: 121 mg/dL — ABNORMAL HIGH (ref 70–99)
Glucose-Capillary: 131 mg/dL — ABNORMAL HIGH (ref 70–99)
Glucose-Capillary: 133 mg/dL — ABNORMAL HIGH (ref 70–99)
Glucose-Capillary: 95 mg/dL (ref 70–99)

## 2020-04-20 LAB — CBC WITH DIFFERENTIAL/PLATELET
Abs Immature Granulocytes: 0.05 10*3/uL (ref 0.00–0.07)
Basophils Absolute: 0 10*3/uL (ref 0.0–0.1)
Basophils Relative: 0 %
Eosinophils Absolute: 0 10*3/uL (ref 0.0–0.5)
Eosinophils Relative: 0 %
HCT: 30.9 % — ABNORMAL LOW (ref 39.0–52.0)
Hemoglobin: 10.3 g/dL — ABNORMAL LOW (ref 13.0–17.0)
Immature Granulocytes: 1 %
Lymphocytes Relative: 12 %
Lymphs Abs: 1.1 10*3/uL (ref 0.7–4.0)
MCH: 32.5 pg (ref 26.0–34.0)
MCHC: 33.3 g/dL (ref 30.0–36.0)
MCV: 97.5 fL (ref 80.0–100.0)
Monocytes Absolute: 1.7 10*3/uL — ABNORMAL HIGH (ref 0.1–1.0)
Monocytes Relative: 19 %
Neutro Abs: 6.3 10*3/uL (ref 1.7–7.7)
Neutrophils Relative %: 68 %
Platelets: 219 10*3/uL (ref 150–400)
RBC: 3.17 MIL/uL — ABNORMAL LOW (ref 4.22–5.81)
RDW: 13.9 % (ref 11.5–15.5)
WBC: 9.1 10*3/uL (ref 4.0–10.5)
nRBC: 0 % (ref 0.0–0.2)

## 2020-04-20 LAB — BASIC METABOLIC PANEL
Anion gap: 6 (ref 5–15)
BUN: 20 mg/dL (ref 8–23)
CO2: 25 mmol/L (ref 22–32)
Calcium: 7.4 mg/dL — ABNORMAL LOW (ref 8.9–10.3)
Chloride: 103 mmol/L (ref 98–111)
Creatinine, Ser: 0.74 mg/dL (ref 0.61–1.24)
GFR calc Af Amer: >60 mL/min (ref >60)
GFR calc non Af Amer: >60 mL/min (ref >60)
Glucose, Bld: 140 mg/dL — ABNORMAL HIGH (ref 70–99)
Potassium: 4.1 mmol/L (ref 3.5–5.1)
Sodium: 134 mmol/L — ABNORMAL LOW (ref 135–145)

## 2020-04-20 LAB — MAGNESIUM: Magnesium: 2.2 mg/dL (ref 1.7–2.4)

## 2020-04-20 LAB — PHOSPHORUS: Phosphorus: 2.6 mg/dL (ref 2.5–4.6)

## 2020-04-20 MED ORDER — IOHEXOL 350 MG/ML SOLN
75.0000 mL | Freq: Once | INTRAVENOUS | Status: AC | PRN
  Administered 2020-04-20: 75 mL via INTRAVENOUS

## 2020-04-20 MED ORDER — ORAL CARE MOUTH RINSE
15.0000 mL | Freq: Two times a day (BID) | OROMUCOSAL | Status: DC
  Administered 2020-04-20 (×2): 15 mL via OROMUCOSAL

## 2020-04-20 MED ORDER — CHLORHEXIDINE GLUCONATE 0.12 % MT SOLN
15.0000 mL | Freq: Two times a day (BID) | OROMUCOSAL | Status: DC
  Administered 2020-04-20: 15 mL via OROMUCOSAL
  Filled 2020-04-20: qty 15

## 2020-04-20 NOTE — Progress Notes (Signed)
Pt was suctioned prior to extubation for a small amount of thick bloody secretions. Per Dr. Zoila Shutter order, he was extubated and placed on 2 L nasal cannula. He has a whisper voice and no stridor is heard.

## 2020-04-20 NOTE — Progress Notes (Signed)
CRITICAL CARE NOTE 79 yo male with ETOH abuse hx admitted to the medsurg unit with ataxia, recurrent falls, inability to ambulate suspected secondary to metabolic derangement. Required transfer to ICU 05/10 following development of seizure activity/tremors and subsequently required mechanical intubation for airway protection following administration of sedating medications  SIGNIFICANT EVENTS/STUDIES: 05/5: Pt admitted to the [facility], recurrent falls, and inability to ambulate 05/5: CT Head revealed noevidence for acute intracranial abnormality.Stable colloid cyst at the left foramen of Monro. Atrophy and chronic small vessel ischemic change of the white matter 05/6: MR Brain revealed no acute intracranial abnormality. Colloid cyst positioned at the left foramen of Monro, stable from previous. Stable ventricular size without evidence for obstructive hydrocephalus. Underlying age-related cerebral atrophy with mild chronic small vessel ischemic disease. 05/6: US Carotid Bilateral revealed atherosclerotic plaque at the carotid bulbs bilaterally. Estimated degree of stenosis in the internal carotid arteries is less than 50% bilaterally. Patent vertebral arteries with antegrade flow 05/6: Echo revealed EF 55 to 60% 05/6: Neurology consulted suspected symptoms likely metabolic related in the setting of electrolyte imbalance. Recommended Thiamine 500 mg iv daily for 1-2 days; continue folate; check B-12 level; and PT/OT  05/7: Pt able to ambulate with rolling walker PT recommended home health PT  05/9: Initially planned to discharge pt home, however after hospitalist spoke with pts sons dischargetohome canceled,and disposition changed to SNF placement 05/10: Rapid response called pt noted to have seizure-like activity/tremors,but no full body shaking alongwith slurred speech suspected secondary to ETOHwithdrawal symptoms;hereceived 2 mg ofivativan and 10 mg ofivlabetalol x1  dose; CT Head ordered, and pt transferred to the stepdown unit. Discharge to [facility] canceled  05/10: CT Head revealed chronic atrophic and ischemic changes. Stable colloid cyst within the left lateral ventricle 05/10: Upon arrival to the stepdown unit pt developed severe agitation with tremors requiring 2 mg of versed and 50 mcg of fentanyl x1 dose. Pt subsequently unable to protect airway due to excessive secretions and sedating medications requiring mechanical i   CC  follow up respiratory failure  SUBJECTIVE Patient remains critically ill Prognosis is guarded   BP (!) 86/42 (BP Location: Left Arm)   Pulse 72   Temp 98.8 F (37.1 C) (Oral)   Resp 18   Ht 5' 8" (1.727 m)   Wt 56.6 kg   SpO2 97%   BMI 18.97 kg/m    I/O last 3 completed shifts: In: 2019.2 [I.V.:857.5; NG/GT:911.7; IV Piggyback:250] Out: 1250 [Urine:1250] No intake/output data recorded.  SpO2: 97 % O2 Flow Rate (L/min): 3 L/min FiO2 (%): 35 %  Estimated body mass index is 18.97 kg/m as calculated from the following:   Height as of this encounter: 5' 8" (1.727 m).   Weight as of this encounter: 56.6 kg.  SIGNIFICANT EVENTS   REVIEW OF SYSTEMS  PATIENT IS UNABLE TO PROVIDE COMPLETE REVIEW OF SYSTEMS DUE TO SEVERE CRITICAL ILLNESS        PHYSICAL EXAMINATION:  GENERAL:critically ill appearing, +resp distress HEAD: Normocephalic, atraumatic.  EYES: Pupils equal, round, reactive to light.  No scleral icterus.  MOUTH: Moist mucosal membrane. NECK: Supple.  PULMONARY: +rhonchi, +wheezing CARDIOVASCULAR: S1 and S2. Regular rate and rhythm. No murmurs, rubs, or gallops.  GASTROINTESTINAL: Soft, nontender, -distended.  Positive bowel sounds.   MUSCULOSKELETAL: No swelling, clubbing, or edema.  NEUROLOGIC: obtunded, GCS<8 SKIN:intact,warm,dry  MEDICATIONS: I have reviewed all medications and confirmed regimen as documented   CULTURE RESULTS   Recent Results (from the past 240 hour(s))  Respiratory Panel by RT PCR (Flu A&B, Covid) - Nasopharyngeal Swab     Status: None   Collection Time: 04/30/2020  7:41 PM   Specimen: Nasopharyngeal Swab  Result Value Ref Range Status   SARS Coronavirus 2 by RT PCR NEGATIVE NEGATIVE Final    Comment: (NOTE) SARS-CoV-2 target nucleic acids are NOT DETECTED. The SARS-CoV-2 RNA is generally detectable in upper respiratoy specimens during the acute phase of infection. The lowest concentration of SARS-CoV-2 viral copies this assay can detect is 131 copies/mL. A negative result does not preclude SARS-Cov-2 infection and should not be used as the sole basis for treatment or other patient management decisions. A negative result may occur with  improper specimen collection/handling, submission of specimen other than nasopharyngeal swab, presence of viral mutation(s) within the areas targeted by this assay, and inadequate number of viral copies (<131 copies/mL). A negative result must be combined with clinical observations, patient history, and epidemiological information. The expected result is Negative. Fact Sheet for Patients:  PinkCheek.be Fact Sheet for Healthcare Providers:  GravelBags.it This test is not yet ap proved or cleared by the [place] FDA and  has been authorized for detection and/or diagnosis of SARS-CoV-2 by FDA under an Emergency Use Authorization (EUA). This EUA will remain  in effect (meaning this test can be used) for the duration of the COVID-19 declaration under Section 564(b)(1) of the Act, 21 U.S.C. section 360bbb-3(b)(1), unless the authorization is terminated or revoked sooner.    Influenza A by PCR NEGATIVE NEGATIVE Final   Influenza B by PCR NEGATIVE NEGATIVE Final    Comment: (NOTE) The Xpert Xpress SARS-CoV-2/FLU/RSV assay is intended as an aid in  the diagnosis of influenza from Nasopharyngeal swab specimens and  should not be used as a sole  basis for treatment. Nasal washings and  aspirates are unacceptable for Xpert Xpress SARS-CoV-2/FLU/RSV  testing. Fact Sheet for Patients: PinkCheek.be Fact Sheet for Healthcare Providers: GravelBags.it This test is not yet approved or cleared by the [place] FDA and  has been authorized for detection and/or diagnosis of SARS-CoV-2 by  FDA under an Emergency Use Authorization (EUA). This EUA will remain  in effect (meaning this test can be used) for the duration of the  Covid-19 declaration under Section 564(b)(1) of the Act, 21  U.S.C. section 360bbb-3(b)(1), unless the authorization is  terminated or revoked. Performed at [facility], [place], [place] [address]   SARS CORONAVIRUS 2 (TAT 6-24 HRS) Nasopharyngeal Nasopharyngeal Swab     Status: None   Collection Time: 04/17/20  1:27 PM   Specimen: Nasopharyngeal Swab  Result Value Ref Range Status   SARS Coronavirus 2 NEGATIVE NEGATIVE Final    Comment: (NOTE) SARS-CoV-2 target nucleic acids are NOT DETECTED. The SARS-CoV-2 RNA is generally detectable in upper and lower respiratory specimens during the acute phase of infection. Negative results do not preclude SARS-CoV-2 infection, do not rule out co-infections with other pathogens, and should not be used as the sole basis for treatment or other patient management decisions. Negative results must be combined with clinical observations, patient history, and epidemiological information. The expected result is Negative. Fact Sheet for Patients: SugarRoll.be Fact Sheet for Healthcare Providers: https://www.woods-mathews.com/ This test is not yet approved or cleared by the [place] FDA and  has been authorized for detection and/or diagnosis of SARS-CoV-2 by FDA under an Emergency Use Authorization (EUA). This EUA will remain  in effect (meaning this  test can be used) for the  duration of the COVID-19 declaration under Section 56 4(b)(1) of the Act, 21 U.S.C. section 360bbb-3(b)(1), unless the authorization is terminated or revoked sooner. Performed at [facility], [place] [address], [place], [place] [address]           IMAGING    EEG  Result Date: 04/19/2020 Alexis Goodell, MD     04/19/2020 11:54 AM ELECTROENCEPHALOGRAM REPORT Patient: Henry Mays       Room #: IC20A-AA EEG No. ID: [id number] Age: 79 y.o.        Sex: male Requesting Physician:  Report Date:  04/19/2020       Interpreting Physician: Alexis Goodell History: Keavon Sensing is an 79 y.o. male with new onset seizure Medications: ASA, Decadron, Keppra, Thiamine, Fentanyl, Levophed Conditions of Recording:  This is a 21 channel routine scalp EEG performed with bipolar and monopolar montages arranged in accordance to the international 10/20 system of electrode placement. One channel was dedicated to EKG recording. The patient is in the intubated and sedated state. Description:  The background activity is slow and poorly organized.  This activity is diffusely distributed with superimposed sleep transients that include symmetrical sleep spindles and vertex central sharp transients. No epileptiform activity is noted.  Hyperventilation and intermittent photic stimulation were not performed. IMPRESSION: Normal asleep electroencephalogram. There are no focal lateralizing or epileptiform features. Alexis Goodell, MD Neurology [phone] 04/19/2020, 11:27 AM   MR BRAIN WO CONTRAST  Result Date: 04/19/2020 CLINICAL DATA:  Ataxia. Stroke suspected. EXAM: MRI HEAD WITHOUT CONTRAST TECHNIQUE: Multiplanar, multiecho pulse sequences of the brain and surrounding structures were obtained without intravenous contrast. COMPARISON:  Head CT Apr 18, 2020 FINDINGS: Brain: No acute infarction, hemorrhage, hydrocephalus or extra-axial collection. A well-defined lesion with mildly hypointense  on T1 and hyperintense on T2 located within the frontal horn of the left lateral ventricle protruding into and narrowing the corresponding foramen of Monro measuring approximately 1.6 x 1.0 x 0.9 cm. No significant change in size when compared to CT performed August 26, 2016. No evidence of trapped left lateral ventricle. Scattered foci of T2 hyperintensity are seen within the white matter of the cerebral hemispheres, nonspecific, most likely related to chronic small vessel ischemia. Prominence of the supratentorial ventricles and cerebral sulci, reflecting parenchymal volume loss. Vascular: Normal flow voids. Skull and upper cervical spine: Normal marrow signal. Sinuses/Orbits: Mucosal thickening of the left frontal and maxillary sinuses and bilateral ethmoid cells. Other: None. IMPRESSION: 1. No acute intracranial abnormality identified. 2. 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 3. Mild chronic small vessel ischemia and parenchymal volume loss. 4. Paranasal sinus disease. Electronically Signed   By: Pedro Earls M.D.   On: 04/19/2020 14:43     Nutrition Status: Nutrition Problem: Severe Malnutrition Etiology: social / environmental circumstances(inadequate oral intake, EtOH use) Signs/Symptoms: severe fat depletion, moderate muscle depletion, severe muscle depletion Interventions: Refer to RD note for recommendations     Indwelling Urinary Catheter continued, requirement due to   Reason to continue Indwelling Urinary Catheter strict Intake/Output monitoring for hemodynamic instability         Ventilator continued, requirement due to severe respiratory failure   Ventilator Sedation RASS 0 to -2      ASSESSMENT AND PLAN SYNOPSIS Severe ACUTE Hypoxic and Hypercapnic Respiratory Failure from acute  SEIZURES AND ACUTE ENCEPHALOPATHY WITH INABILITY TO PROTECT AIRWAY WITH  ACUTE ASPIRATION PNEUMONIA  Severe ACUTE Hypoxic and Hypercapnic Respiratory Failure -continue Full MV support -continue Bronchodilator Therapy -Wean Fio2 and PEEP as tolerated -will perform SAT/SBT when respiratory parameters are met -VAP/VENT bundle implementation   NEUROLOGY ?SEIZURES - intubated and sedated - minimal sedation to achieve a RASS goal: -1 Wake up assessment pending  CARDIAC ICU monitoring  ID -continue IV abx as prescibed -follow up cultures  GI GI PROPHYLAXIS as indicated  NUTRITIONAL STATUS Nutrition Status: Nutrition Problem: Severe Malnutrition Etiology: social / environmental circumstances(inadequate oral intake, EtOH use) Signs/Symptoms: severe fat depletion, moderate muscle depletion, severe muscle depletion Interventions: Refer to RD note for recommendations   DIET-->TF's as tolerated Constipation protocol as indicated  ENDO - will use ICU hypoglycemic\Hyperglycemia protocol if indicated   ELECTROLYTES -follow labs as needed -replace as needed -pharmacy consultation and following   DVT/GI PRX ordered TRANSFUSIONS AS NEEDED MONITOR FSBS ASSESS the need for LABS as needed   Critical Care Time devoted to patient care services described in this note is 32 minutes.   Overall, patient is critically ill, prognosis is guarded.  Patient with Multiorgan failure and at high risk for cardiac arrest and death.    Corrin Parker, M.D.  [facility]  Medical Director [place] Director [facility] Cardio-Pulmonary Department

## 2020-04-20 NOTE — Progress Notes (Addendum)
Subjective: Patient extubated today.  Awake.  Responding to questioning.  Does not appear agitated at this time.    Objective: Current vital signs: BP (!) 104/49   Pulse 87   Temp 99 F (37.2 C) (Axillary)   Resp 17   Ht 5\' 8"  (1.727 m)   Wt 56.6 kg   SpO2 97%   BMI 18.97 kg/m  Vital signs in last 24 hours: Temp:  [97.9 F (36.6 C)-100.1 F (37.8 C)] 99 F (37.2 C) (05/12 0800) Pulse Rate:  [58-107] 87 (05/12 0945) Resp:  [12-23] 17 (05/12 0945) BP: (86-164)/(40-84) 104/49 (05/12 0945) SpO2:  [92 %-100 %] 97 % (05/12 0945) FiO2 (%):  [21 %-35 %] 35 % (05/12 0815) Weight:  [56.6 kg] 56.6 kg (05/12 0500)  Intake/Output from previous day: 05/11 0701 - 05/12 0700 In: 2019.2 [I.V.:857.5; NG/GT:911.7; IV Piggyback:250] Out: 1250 [Urine:1250] Intake/Output this shift: Total I/O In: 339.8 [I.V.:339.8] Out: -  Nutritional status:  Diet Order            Diet general        Diet - low sodium heart healthy              Neurologic Exam: Mental Status: Awake and alert.  Responds by nodding head to questions.  Follows simple commands.   Cranial Nerves: II: Blinks to bilateral confrontation III,IV, VI: extra-ocular motions intact bilaterally V,VII: face symmetric VIII: hearing normal bilaterally IX,X: gag reflex present XI: bilateral shoulder shrug XII: midline tongue extension Motor: Moves all extremities weakly against gravity  Lab Results: Basic Metabolic Panel: Recent Labs  Lab 04/28/2020 1641 04/21/2020 1641 04/15/20 0441 04/15/20 0441 04/18/20 1600 04/18/20 1958 04/19/20 0630 04/19/20 0632 04/19/20 1701 04/20/20 0613  NA 129*  --  136  --  130*  --   --  132*  --  134*  K 3.5  --  3.7  --  3.3*  --   --  3.7  --  4.1  CL 93*  --  104  --  96*  --   --  101  --  103  CO2 25  --  24  --  25  --   --  23  --  25  GLUCOSE 108*  --  91  --  206*  --   --  165*  --  140*  BUN 15  --  12  --  14  --   --  18  --  20  CREATININE 0.93  --  0.73  --  0.79  --    --  0.73  --  0.74  CALCIUM 8.2*   < > 7.6*   < > 8.2*  --   --  7.7*  --  7.4*  MG  --   --   --   --  1.9 1.8 2.4  --  2.1 2.2  PHOS  --   --   --   --  2.0* 3.5 3.0  --  2.5 2.6   < > = values in this interval not displayed.    Liver Function Tests: Recent Labs  Lab 04/24/2020 1641 04/18/20 1600  AST 164* 24  ALT 121* 29  ALKPHOS 61 49  BILITOT 2.0* 1.3*  PROT 8.3* 7.4  ALBUMIN 3.5 3.1*   No results for input(s): LIPASE, AMYLASE in the last 168 hours. Recent Labs  Lab 04/23/2020 2054  AMMONIA 10    CBC: Recent Labs  Lab 04/26/2020 1641 04/18/20  1600 04/19/20 0632 04/20/20 0613  WBC 7.1 10.3 11.8* 9.1  NEUTROABS  --   --  7.4 6.3  HGB 12.9* 11.6* 11.8* 10.3*  HCT 38.1* 33.9* 35.0* 30.9*  MCV 96.2 94.2 96.7 97.5  PLT 165 151 201 219    Cardiac Enzymes: Recent Labs  Lab 04/09/2020 1641  CKTOTAL 61    Lipid Panel: Recent Labs  Lab 04/14/20 0609  CHOL 234*  TRIG 72  HDL 128  CHOLHDL 1.8  VLDL 14  LDLCALC 92    CBG: Recent Labs  Lab 04/19/20 1724 04/19/20 2032 04/19/20 2344 04/20/20 0437 04/20/20 0739  GLUCAP 144* 78 124* 133* 131*    Microbiology: Results for orders placed or performed during the hospital encounter of 04/20/2020  Respiratory Panel by RT PCR (Flu A&B, Covid) - Nasopharyngeal Swab     Status: None   Collection Time: 05/05/2020  7:41 PM   Specimen: Nasopharyngeal Swab  Result Value Ref Range Status   SARS Coronavirus 2 by RT PCR NEGATIVE NEGATIVE Final    Comment: (NOTE) SARS-CoV-2 target nucleic acids are NOT DETECTED. The SARS-CoV-2 RNA is generally detectable in upper respiratoy specimens during the acute phase of infection. The lowest concentration of SARS-CoV-2 viral copies this assay can detect is 131 copies/mL. A negative result does not preclude SARS-Cov-2 infection and should not be used as the sole basis for treatment or other patient management decisions. A negative result may occur with  improper specimen  collection/handling, submission of specimen other than nasopharyngeal swab, presence of viral mutation(s) within the areas targeted by this assay, and inadequate number of viral copies (<131 copies/mL). A negative result must be combined with clinical observations, patient history, and epidemiological information. The expected result is Negative. Fact Sheet for Patients:  PinkCheek.be Fact Sheet for Healthcare Providers:  GravelBags.it This test is not yet ap proved or cleared by the [place] FDA and  has been authorized for detection and/or diagnosis of SARS-CoV-2 by FDA under an Emergency Use Authorization (EUA). This EUA will remain  in effect (meaning this test can be used) for the duration of the COVID-19 declaration under Section 564(b)(1) of the Act, 21 U.S.C. section 360bbb-3(b)(1), unless the authorization is terminated or revoked sooner.    Influenza A by PCR NEGATIVE NEGATIVE Final   Influenza B by PCR NEGATIVE NEGATIVE Final    Comment: (NOTE) The Xpert Xpress SARS-CoV-2/FLU/RSV assay is intended as an aid in  the diagnosis of influenza from Nasopharyngeal swab specimens and  should not be used as a sole basis for treatment. Nasal washings and  aspirates are unacceptable for Xpert Xpress SARS-CoV-2/FLU/RSV  testing. Fact Sheet for Patients: PinkCheek.be Fact Sheet for Healthcare Providers: GravelBags.it This test is not yet approved or cleared by the [place] FDA and  has been authorized for detection and/or diagnosis of SARS-CoV-2 by  FDA under an Emergency Use Authorization (EUA). This EUA will remain  in effect (meaning this test can be used) for the duration of the  Covid-19 declaration under Section 564(b)(1) of the Act, 21  U.S.C. section 360bbb-3(b)(1), unless the authorization is  terminated or revoked. Performed at [facility],  [place], [place] [address]   SARS CORONAVIRUS 2 (TAT 6-24 HRS) Nasopharyngeal Nasopharyngeal Swab     Status: None   Collection Time: 04/17/20  1:27 PM   Specimen: Nasopharyngeal Swab  Result Value Ref Range Status   SARS Coronavirus 2 NEGATIVE NEGATIVE Final    Comment: (NOTE) SARS-CoV-2 target  nucleic acids are NOT DETECTED. The SARS-CoV-2 RNA is generally detectable in upper and lower respiratory specimens during the acute phase of infection. Negative results do not preclude SARS-CoV-2 infection, do not rule out co-infections with other pathogens, and should not be used as the sole basis for treatment or other patient management decisions. Negative results must be combined with clinical observations, patient history, and epidemiological information. The expected result is Negative. Fact Sheet for Patients: SugarRoll.be Fact Sheet for Healthcare Providers: https://www.woods-mathews.com/ This test is not yet approved or cleared by the [place] FDA and  has been authorized for detection and/or diagnosis of SARS-CoV-2 by FDA under an Emergency Use Authorization (EUA). This EUA will remain  in effect (meaning this test can be used) for the duration of the COVID-19 declaration under Section 56 4(b)(1) of the Act, 21 U.S.C. section 360bbb-3(b)(1), unless the authorization is terminated or revoked sooner. Performed at [facility], [place] [address], [place], [place] [address]     Coagulation Studies: No results for input(s): LABPROT, INR in the last 72 hours.  Imaging: EEG  Result Date: 04/19/2020 Alexis Goodell, MD     04/19/2020 11:54 AM ELECTROENCEPHALOGRAM REPORT Patient: Henry Mays       Room #: IC20A-AA EEG No. ID: [id number] Age: 79 y.o.        Sex: male Requesting Physician: Kasa Report Date:  04/19/2020       Interpreting Physician: Alexis Goodell History: Henry Mays is an 79 y.o. male with new onset  seizure Medications: ASA, Decadron, Keppra, Thiamine, Fentanyl, Levophed Conditions of Recording:  This is a 21 channel routine scalp EEG performed with bipolar and monopolar montages arranged in accordance to the international 10/20 system of electrode placement. One channel was dedicated to EKG recording. The patient is in the intubated and sedated state. Description:  The background activity is slow and poorly organized.  This activity is diffusely distributed with superimposed sleep transients that include symmetrical sleep spindles and vertex central sharp transients. No epileptiform activity is noted.  Hyperventilation and intermittent photic stimulation were not performed. IMPRESSION: Normal asleep electroencephalogram. There are no focal lateralizing or epileptiform features. Alexis Goodell, MD Neurology [phone] 04/19/2020, 11:27 AM   DG Abd 1 View  Result Date: 04/18/2020 CLINICAL DATA:  Check gastric catheter placement EXAM: ABDOMEN - 1 VIEW COMPARISON:  09/29/2016 FINDINGS: Gastric catheter is now seen with the tip in the stomach. Proximal side port lies at the gastroesophageal junction. This could be advanced further into the stomach. The visualized upper abdomen is unremarkable. IMPRESSION: Gastric catheter as described which could be advanced further into the stomach. Electronically Signed   By: Inez Catalina M.D.   On: 04/18/2020 19:56   CT HEAD WO CONTRAST  Result Date: 04/18/2020 CLINICAL DATA:  Recent seizure-like activity EXAM: CT HEAD WITHOUT CONTRAST TECHNIQUE: Contiguous axial images were obtained from the base of the skull through the vertex without intravenous contrast. COMPARISON:  04/14/2011 FINDINGS: Brain: Mild atrophic changes are identified as are mild chronic white matter ischemic change. A stable colloid cyst is noted just to the left of the foramen of Monro measuring approximately 16 mm. No new focal mass lesion is seen. No findings to suggest acute hemorrhage or acute  infarction are noted. Vascular: No hyperdense vessel or unexpected calcification. Skull: Normal. Negative for fracture or focal lesion. Sinuses/Orbits: Mild mucosal thickening is noted in the left maxillary antrum. This is chronic in nature and stable from the previous exam. Other: None IMPRESSION: Chronic atrophic  and ischemic changes. 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 Electronically Signed   By: Inez Catalina M.D.   On: 04/18/2020 15:28   MR BRAIN WO CONTRAST  Result Date: 04/19/2020 CLINICAL DATA:  Ataxia. Stroke suspected. EXAM: MRI HEAD WITHOUT CONTRAST TECHNIQUE: Multiplanar, multiecho pulse sequences of the brain and surrounding structures were obtained without intravenous contrast. COMPARISON:  Head CT Apr 18, 2020 FINDINGS: Brain: No acute infarction, hemorrhage, hydrocephalus or extra-axial collection. A well-defined lesion with mildly hypointense on T1 and hyperintense on T2 located within the frontal horn of the left lateral ventricle protruding into and narrowing the corresponding foramen of Monro measuring approximately 1.6 x 1.0 x 0.9 cm. No significant change in size when compared to CT performed August 26, 2016. No evidence of trapped left lateral ventricle. Scattered foci of T2 hyperintensity are seen within the white matter of the cerebral hemispheres, nonspecific, most likely related to chronic small vessel ischemia. Prominence of the supratentorial ventricles and cerebral sulci, reflecting parenchymal volume loss. Vascular: Normal flow voids. Skull and upper cervical spine: Normal marrow signal. Sinuses/Orbits: Mucosal thickening of the left frontal and maxillary sinuses and bilateral ethmoid cells. Other: None. IMPRESSION: 1. No acute intracranial abnormality identified. 2. 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 3. Mild chronic small vessel ischemia and parenchymal volume loss. 4. Paranasal sinus disease. Electronically Signed   By: Pedro Earls M.D.   On: 04/19/2020 14:43   DG Chest Port 1 View  Result Date: 04/18/2020 CLINICAL DATA:  Check endotracheal tube placement EXAM: PORTABLE CHEST 1 VIEW COMPARISON:  Film from earlier in the same day. FINDINGS: Cardiac shadow is stable. Aortic calcifications are again seen. Endotracheal tube is noted 6 cm above the carina in satisfactory position. The lungs are hyperinflated with mild left basilar scarring identified IMPRESSION: COPD with left basilar scarring. Endotracheal tube in satisfactory position. Electronically Signed   By: Inez Catalina M.D.   On: 04/18/2020 19:55    Medications:  I have reviewed the patient's current medications. Scheduled: .  stroke: mapping our early stages of recovery book   Does not apply Once  . vitamin C  500 mg Oral Daily  . aspirin  81 mg Per Tube Daily  . chlorhexidine  15 mL Mouth Rinse BID  . Chlorhexidine Gluconate Cloth  6 each Topical Daily  . dexamethasone  0.25 mg Per Tube Daily  . docusate  100 mg Per Tube BID  . enoxaparin (LOVENOX) injection  40 mg Subcutaneous Q24H  . insulin aspart  0-15 Units Subcutaneous Q4H  . LORazepam  1 mg Intravenous Once  . mouth rinse  15 mL Mouth Rinse q12n4p  . multivitamin  15 mL Per Tube Daily    Assessment/Plan: 79 year old malewith a known history ofhypertension, osteoarthritis, alcohol use and C. difficile colitis, whowas initially admitted with ataxia and tremor. Felt to be metabolic in etiology. Patient scheduled for discharge yesterday but noted to have what was felt to be a generalized tonic-clonic seizure prior to discharge.  Patient with a recent MRI of the brain on 5/5 that was only significant for some atrophy and small vessel ischemic changes. Head CT without contrast performed yesterday showed no acute changes. Patient outside  window for seizure related to DT's. Although seizure may still be ETOH related. There was concern initially for thiamine deficiency as well.  Patient now on Keppra and IV thiamine.  EEG reviewed and only significant for normal sleep.  No epileptiform activity is noted.  Magnesium and phosphorus are normal.  Repeat MRI of the brain personally reviewed and shows no acute changes. Further details on description of seizure like event obtained from son.  Unclear if this was actually a seizure. Unclear if patient may have been having some side effects to Keppra as well.    Recommendations: 1. D/C Keppra 2. Seizure precautions 3. Continue thiamine 4. PT/OT 5. CTA of the head and neck to evaluate the posterior circulation.  Recent echocardiogram on 5/6 shows no cardiac source fo emboli with EF of 55-60%.  No indication to repeat.  A1c 5.6, LDL 92.      LOS: 6 days   Alexis Goodell, MD Neurology [phone] 04/20/2020  10:22 AM

## 2020-04-20 NOTE — Progress Notes (Signed)
[place] visited pt. as follow-up from visit yesterday; pt. sitting up in bed, extubated this AM acc. to medical team, sons @ bedside.  Sons said pt. is markedly better today; pt. able to speak though voice hoarse.  Pt. and sons grateful for Fr. Paul's visit yesterday --> would be interested in follow-up visit, but [place] explained priests' visits primarily by appointment right now.  Family asked if any eucharistic ministers come bring communion to pts.  [place] said he would look into this and follow up if this is a possibility.  Sons grateful for [facility] support; no further needs at this time.    04/20/20 1530  Clinical Encounter Type  Visited With Patient and family together  Visit Type Follow-up;Psychological support;Social support;Critical Care  Referral From Other (Comment) ([place])  Consult/Referral To Faith community  Spiritual Encounters  Spiritual Needs Emotional (Sacramental Support)  Stress Factors  Family Stress Factors Major life changes;Health changes

## 2020-04-20 NOTE — Evaluation (Signed)
Clinical/Bedside Swallow Evaluation Patient Details  Name: Lucien Ehrman MRN: [id number] Date of Birth: [birth year]  Today's Date: 04/20/2020 Time:        Past Medical History:  Past Medical History:  Diagnosis Date  . Arthritis   . Basal cell carcinoma   . Chickenpox   . History of Clostridium difficile infection   . Hypertension   . MGUS (monoclonal gammopathy of unknown significance)   . Near syncope    Past Surgical History:  Past Surgical History:  Procedure Laterality Date  . BASAL CELL CARCINOMA EXCISION    . HERNIA REPAIR     HPI:  79 yo male with ETOH abuse hx admitted to the medsurg unit with ataxia, recurrent falls, inability to ambulate suspected secondary to metabolic derangement.  Required transfer to ICU 05/10 following development of seizure activity/tremors and subsequently required mechanical intubation for airway protection following administration of sedating medications. MRI and CT continue to remain stable with no acute abnormalities. Of note, pt with recent bedside swallow evaluation on 04/14/2020. Pt demonstrated functional oropharyngeal abilities and was on a regular diet with thin liquids prior to medical decline.    Assessment / Plan / Recommendation Clinical Impression  Pt presents with increased risk of aspiration with PO intake. Although pt was intubated for short period of time (2 days), pt presents with decreased vocal intensity and increased hoarseness after extubation this morning. Pt presents with moderate pharyngeal phase dysphagia that is c/b multiple swallows and immediate throats when consuming ice chips and thin liquids via spoon, cup and straw. These overt s/s decrease with nectar thick liquids but pt continues to have immediate subtle throat clears. Recommend pt continue to remain NPO and ST to follow for diet advancement. Education provided to MD, nurse, and pt's son.  SLP Visit Diagnosis: Dysphagia, pharyngeal phase (R13.13)    Aspiration  Risk  Moderate aspiration risk    Diet Recommendation   NPO  Medication Administration: Via alternative means    Other  Recommendations Oral Care Recommendations: Oral care QID   Follow up Recommendations Skilled Nursing facility      Frequency and Duration min 2x/week  2 weeks       Prognosis Prognosis for Safe Diet Advancement: Good      Swallow Study   General Date of Onset: 04/18/20 HPI: 79 yo male with ETOH abuse hx admitted to the medsurg unit with ataxia, recurrent falls, inability to ambulate suspected secondary to metabolic derangement.  Required transfer to ICU 05/10 following development of seizure activity/tremors and subsequently required mechanical intubation for airway protection following administration of sedating medications. MRI and CT continue to remain stable with no acute abnormalitiesOf note, pt with recent bedside swallow evaluation on 04/14/2020. Pt demonstrated functional oropharyngeal abilities and was on a regular diet with thin liquids prior to medical decline.  Type of Study: Bedside Swallow Evaluation Previous Swallow Assessment: Bedside Swallow on 04/14/2020 Diet Prior to this Study: Regular;Thin liquids Temperature Spikes Noted: No Respiratory Status: Room air History of Recent Intubation: Yes Length of Intubations (days): 2 days Date extubated: 04/20/20 Behavior/Cognition: Alert(mildly irritable) Oral Cavity Assessment: Dry Oral Care Completed by SLP: Recent completion by staff Oral Cavity - Dentition: Dentures, top Vision: Functional for self-feeding Self-Feeding Abilities: Needs assist Patient Positioning: Upright in bed Baseline Vocal Quality: Hoarse;Low vocal intensity Volitional Cough: Strong Volitional Swallow: Able to elicit    Oral/Motor/Sensory Function Overall Oral Motor/Sensory Function: Within functional limits   Ice Chips Ice chips: Impaired Presentation: Spoon Oral Phase Impairments:  Impaired mastication;Reduced lingual  movement/coordination Oral Phase Functional Implications: Oral holding Pharyngeal Phase Impairments: Suspected delayed Swallow;Multiple swallows;Throat Clearing - Immediate   Thin Liquid Thin Liquid: Impaired Presentation: Straw;Cup Pharyngeal  Phase Impairments: Suspected delayed Swallow;Multiple swallows;Throat Clearing - Immediate;Cough - Immediate    Nectar Thick Nectar Thick Liquid: Impaired Presentation: Spoon;Cup Pharyngeal Phase Impairments: Suspected delayed Swallow;Multiple swallows;Throat Clearing - Immediate   Honey Thick Honey Thick Liquid: Not tested   Puree Puree: Not tested   Solid     Solid: Not tested     Ekaterini Capitano B. Rutherford Nail M.S., CCC-SLP, [place] Pathologist Rehabilitation Services Office [place] 04/20/2020,3:20 PM

## 2020-04-20 NOTE — Progress Notes (Signed)
[place] for Electrolyte Monitoring and Replacement   Recent Labs: Potassium (mmol/L)  Date Value  04/20/2020 4.1   Magnesium (mg/dL)  Date Value  04/20/2020 2.2   Calcium (mg/dL)  Date Value  04/20/2020 7.4 (L)   Albumin (g/dL)  Date Value  04/18/2020 3.1 (L)   Phosphorus (mg/dL)  Date Value  04/20/2020 2.6   Sodium (mmol/L)  Date Value  04/20/2020 134 (L)     Assessment: 79 year old male transferred to ICU, required intubation. Patient remains intubated and sedated. Neurology following, patient on Keppra and high dose thiamine. Pharmacy to manage electrolytes. Patient is at risk for refeeding syndrome.  Goal of Therapy:  Electrolytes WNL  Plan:  No replacement today. Will follow up with morning labs.  Tawnya Crook ,PharmD Clinical Pharmacist 04/20/2020 2:24 PM

## 2020-04-20 NOTE — Progress Notes (Signed)
PT Cancellation Note  Patient Details Name: Henry Mays MRN: [id number] DOB: Dec 18, 1940   Cancelled Treatment:    Reason Eval/Treat Not Completed: Other (comment)(Per RN, readying for patient for extubation today. PT to follow up when patient's respiratory status is stabilized for exertional activity.)   Lieutenant Diego PT, DPT 8:53 AM,04/20/20

## 2020-04-20 NOTE — Progress Notes (Signed)
Patient extubated at approximately 0905 to 2L [place]. Lungs diminished. Patient alert and oriented to self and place. Intermittent confusion. Sons at bedside after extubation. Patient weaned off of levophed. No complaints of pain. Patient kept NPO per Dr. Mortimer Fries until speech can evaluate.  Lupita Leash

## 2020-04-21 LAB — CBC WITH DIFFERENTIAL/PLATELET
Abs Immature Granulocytes: 0.02 10*3/uL (ref 0.00–0.07)
Basophils Absolute: 0 10*3/uL (ref 0.0–0.1)
Basophils Relative: 1 %
Eosinophils Absolute: 0 10*3/uL (ref 0.0–0.5)
Eosinophils Relative: 0 %
HCT: 32.8 % — ABNORMAL LOW (ref 39.0–52.0)
Hemoglobin: 11.2 g/dL — ABNORMAL LOW (ref 13.0–17.0)
Immature Granulocytes: 0 %
Lymphocytes Relative: 9 %
Lymphs Abs: 0.6 10*3/uL — ABNORMAL LOW (ref 0.7–4.0)
MCH: 32.5 pg (ref 26.0–34.0)
MCHC: 34.1 g/dL (ref 30.0–36.0)
MCV: 95.1 fL (ref 80.0–100.0)
Monocytes Absolute: 0.9 10*3/uL (ref 0.1–1.0)
Monocytes Relative: 14 %
Neutro Abs: 5.1 10*3/uL (ref 1.7–7.7)
Neutrophils Relative %: 76 %
Platelets: 201 10*3/uL (ref 150–400)
RBC: 3.45 MIL/uL — ABNORMAL LOW (ref 4.22–5.81)
RDW: 13.4 % (ref 11.5–15.5)
WBC: 6.7 10*3/uL (ref 4.0–10.5)
nRBC: 0 % (ref 0.0–0.2)

## 2020-04-21 LAB — BASIC METABOLIC PANEL
Anion gap: 9 (ref 5–15)
BUN: 10 mg/dL (ref 8–23)
CO2: 25 mmol/L (ref 22–32)
Calcium: 7.8 mg/dL — ABNORMAL LOW (ref 8.9–10.3)
Chloride: 98 mmol/L (ref 98–111)
Creatinine, Ser: 0.42 mg/dL — ABNORMAL LOW (ref 0.61–1.24)
GFR calc Af Amer: >60 mL/min (ref >60)
GFR calc non Af Amer: >60 mL/min (ref >60)
Glucose, Bld: 183 mg/dL — ABNORMAL HIGH (ref 70–99)
Potassium: 3.5 mmol/L (ref 3.5–5.1)
Sodium: 132 mmol/L — ABNORMAL LOW (ref 135–145)

## 2020-04-21 LAB — GLUCOSE, CAPILLARY
Glucose-Capillary: 64 mg/dL — ABNORMAL LOW (ref 70–99)
Glucose-Capillary: 116 mg/dL — ABNORMAL HIGH (ref 70–99)
Glucose-Capillary: 109 mg/dL — ABNORMAL HIGH (ref 70–99)

## 2020-04-21 MED ORDER — MIDAZOLAM HCL 2 MG/2ML IJ SOLN
2.0000 mg | INTRAMUSCULAR | Status: DC | PRN
  Administered 2020-04-21: 4 mg via INTRAVENOUS
  Filled 2020-04-21: qty 4

## 2020-04-21 MED ORDER — MORPHINE BOLUS VIA INFUSION
5.0000 mg | INTRAVENOUS | Status: DC | PRN
  Filled 2020-04-21: qty 5

## 2020-04-21 MED ORDER — DIPHENHYDRAMINE HCL 50 MG/ML IJ SOLN: 25.0000 mg | INTRAMUSCULAR | Status: DC | PRN

## 2020-04-21 MED ORDER — MORPHINE SULFATE (PF) 2 MG/ML IV SOLN: 2.0000 mg | INTRAVENOUS | Status: DC | PRN

## 2020-04-21 MED ORDER — ACETAMINOPHEN 650 MG RE SUPP: 650.0000 mg | Freq: Four times a day (QID) | RECTAL | Status: DC | PRN

## 2020-04-21 MED ORDER — DEXTROSE 50 % IV SOLN
INTRAVENOUS | Status: AC
  Administered 2020-04-21: 50 mL
  Filled 2020-04-21: qty 50

## 2020-04-21 MED ORDER — GLYCOPYRROLATE 0.2 MG/ML IJ SOLN: 0.2000 mg | INTRAMUSCULAR | Status: DC | PRN

## 2020-04-21 MED ORDER — ACETAMINOPHEN 325 MG PO TABS: 650.0000 mg | ORAL_TABLET | Freq: Four times a day (QID) | ORAL | Status: DC | PRN

## 2020-04-21 MED ORDER — GLYCOPYRROLATE 0.2 MG/ML IJ SOLN
0.2000 mg | INTRAMUSCULAR | Status: DC | PRN
  Administered 2020-04-21: 0.2 mg via INTRAVENOUS
  Filled 2020-04-21: qty 1

## 2020-04-21 MED ORDER — MORPHINE 100MG IN NS 100ML (1MG/ML) PREMIX INFUSION
0.0000 mg/h | INTRAVENOUS | Status: DC
  Administered 2020-04-21: 1 mg/h via INTRAVENOUS
  Administered 2020-04-21: 9 mg/h via INTRAVENOUS
  Filled 2020-04-21 (×2): qty 100

## 2020-04-21 MED ORDER — IPRATROPIUM-ALBUTEROL 0.5-2.5 (3) MG/3ML IN SOLN: 3.0000 mL | RESPIRATORY_TRACT | Status: DC | PRN

## 2020-04-21 MED ORDER — GLYCOPYRROLATE 1 MG PO TABS
1.0000 mg | ORAL_TABLET | ORAL | Status: DC | PRN
  Filled 2020-04-21: qty 1

## 2020-04-21 MED ORDER — IPRATROPIUM-ALBUTEROL 0.5-2.5 (3) MG/3ML IN SOLN
3.0000 mL | Freq: Four times a day (QID) | RESPIRATORY_TRACT | Status: DC
  Administered 2020-04-21: 3 mL via RESPIRATORY_TRACT
  Filled 2020-04-21: qty 3

## 2020-04-21 MED ORDER — DEXTROSE 5 % IV SOLN: INTRAVENOUS | Status: DC

## 2020-04-21 MED ORDER — POLYVINYL ALCOHOL 1.4 % OP SOLN
1.0000 [drp] | Freq: Four times a day (QID) | OPHTHALMIC | Status: DC | PRN
  Filled 2020-04-21: qty 15

## 2020-04-21 NOTE — Progress Notes (Signed)
Henry Mays has been resting well today.  His morning began with distress as he has had significant trouble clearing secretions from his throat.  Physician consulted family and collaborated to provide comfort care.  Morphine drip was ordered and titrated from 1mg /hr-9mg /hr over a 10hr period.  Patient was bathed and turned in bed.  I suctioned his mouth and performed nasopharyngeal suctioning as well.  Educated family at bedside about what to expect with patients dying process.  Patient at end of shift was desaturating into the low 60's.

## 2020-04-21 NOTE — Progress Notes (Signed)
[place] consulted in ICU hallway by pt.s' son Legrand Como; sons ask if Fr. Eddie Dibbles administered Last Rites on Tuesday or only Anointing of the Sick; [facility] not sure --> sons contacted Fr. Eddie Dibbles and confirmed pt. did receive full Last Rites.  Sons say pt.'s condition has deteriorated rapidly, he is now comfort care.  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 remained vague in describing what kind of work he did. 'Love of work, love of God, and love of family were what my dad believed in the most, Aaron Edelman shared.  Aaron Edelman tearful in expressing his sense of shock that pt. will not be going home; dealing with difficulty of having hopes of pt.'s recovery sparked by yesterday's apparent improvement crushed.  [place] left [place] @ bedside w/pt. after some time; [place] remains available to pt./sons/family and will follow up this PM if possible.    04/21/20 1100  Clinical Encounter Type  Visited With Patient and family together;Health care provider  Visit Type Follow-up;Spiritual support;Social support;Psychological support;Patient actively dying;Critical Care  Referral From Family  Consult/Referral To Chaplain  Spiritual Encounters  Spiritual Needs Emotional;Grief support  Stress Factors  Family Stress Factors Loss;Lack of caregivers;Major life changes;Family relationships

## 2020-04-21 NOTE — Progress Notes (Signed)
SLP Cancellation Note  Patient Details Name: Honor Bertino MRN: [id number] DOB: 1941/01/31   Cancelled treatment:       Reason Eval/Treat Not Completed: Medical issues which prohibited therapy   Unfortunately pt has continued to have a medical decline. Pt's family has decided on comfort care only. ST will discharge orders.   Anija Brickner B. Rutherford Nail M.S., CCC-SLP, [place] Office [phone]  Stormy Fabian 04/21/2020, 12:15 PM

## 2020-04-21 NOTE — Progress Notes (Signed)
The Clinical status was relayed to family in detail. 2 sons at bedside  Patient remains unresponsive and will not open eyes to command.  His vitals 171/81 (107)  HR 111 RR 29  Saturation 96%.  Upon assessment his breath sounds are course crackles with significant secretions to his oral pharyngeal region.  Nasopharyngeal suction produced copious sanguineous secretions.  Patient is having a weak cough and struggling to remove secretions.  patient with increased WOB and using accessory muscles to breathe Explained to family course of therapy and the modalities    Updated and notified of patients medical condition.  Patient with Progressive multiorgan failure with very low chance of meaningful recovery despite all aggressive and optimal medical therapy.  Patient is in the dying Process associated with suffering.  Family understands the situation.  They have consented and agreed to DNR/DNI and would like to proceed with Comfort care measures.  Family are satisfied with Plan of action and management. All questions answered  Additional CC time 32 mins   Verity Gilcrest Patricia Pesa, M.D.  [facility]  Medical Director [place] Director [facility] Cardio-Pulmonary Department

## 2020-04-21 NOTE — Progress Notes (Signed)
Pt developed  increase WOB, congested, tachypenic , and tachycardic. Anti- suction x2 . Alert answer to his name, drowsy and lethagic. NP notified. Will continue to monitor pt. Close.y

## 2020-04-21 NOTE — Progress Notes (Signed)
Patient family decided on comfort care therapy.  Started Morphine IV infusion for comfort.

## 2020-04-21 NOTE — Progress Notes (Signed)
Patient remains unresponsive and will not open eyes to command.  His vitals 171/81 (107)  HR 111 RR 29  Saturation 96%.  Upon assessment his breath sounds are course crackles with significant secretions to his oral pharyngeal region.  Nasopharyngeal suction produced copious sanguineous secretions.  Patient is having a weak cough and struggling to remove secretions.  Explained to family course of therapy and the modalities offered by physician staff.  Educated family about comfort care measures to comfort patient in his disease process.

## 2020-04-21 NOTE — Progress Notes (Signed)
CRITICAL CARE NOTE 78 yo male with ETOH abuse hx admitted to the medsurg unit with ataxia, recurrent falls, inability to ambulate suspected secondary to metabolic derangement. Required transfer to ICU 05/10 following development of seizure activity/tremors and subsequently required mechanical intubation for airway protection following administration of sedating medications  SIGNIFICANT EVENTS/STUDIES: 05/5: [facility], recurrent falls, and inability to ambulate 05/5: CT Head revealed noevidence for acute intracranial abnormality.Stable colloid cyst at the left foramen of Monro. Atrophy and chronic small vessel ischemic change of the white matter 05/6: MR Brain revealed no acute intracranial abnormality. Colloid cyst positioned at the left foramen of Monro, stable from previous. Stable ventricular size without evidence for obstructive hydrocephalus. Underlying age-related cerebral atrophy with mild chronic small vessel ischemic disease. 05/6: US Carotid Bilateral revealed atherosclerotic plaque at the carotid bulbs bilaterally. Estimated degree of stenosis in the internal carotid arteries is less than 50% bilaterally. Patent vertebral arteries with antegrade flow 05/6: Echo revealed EF 55 to 60% 05/6: Neurology consulted suspected symptoms likely metabolic related in the setting of electrolyte imbalance. Recommended Thiamine 500 mg iv daily for 1-2 days; continue folate; check B-12 level; and PT/OT  05/7: Pt able to ambulate with rolling walker PT recommended home health PT  05/9: Initially planned to discharge pt home, however after hospitalist spoke with pts sons dischargetohome canceled,and disposition changed to SNF placement 05/10: Rapid response called pt noted to have seizure-like activity/tremors,but no full body shaking alongwith slurred speech suspected secondary to ETOHwithdrawal symptoms;hereceived 2 mg ofivativan and 10 mg ofivlabetalol x1  dose; CT Head ordered, and pt transferred to the stepdown unit. Discharge to [facility] canceled  05/10: CT Head revealed chronic atrophic and ischemic changes. Stable colloid cyst within the left lateral ventricle 05/10: Upon arrival to the stepdown unit pt developed severe agitation with tremors requiring 2 mg of versed and 50 mcg of fentanyl x1 dose. Pt subsequently unable to protect airway due to excessive secretions and sedating medications requiring mechanical i 5/13 extubated, weak cough 5/14 increased WOB, +aspiration, agitated combative High risk for intubation  CC  Follow up resp failure  SUBJECTIVE High risk for intubation Remains critically ill Prognosis guarded +signsof aspiration   BP (!) 159/73   Pulse (!) 107   Temp 98.9 F (37.2 C) (Oral)   Resp 18   Ht 5\' 8"  (1.727 m)   Wt 59.9 kg   SpO2 96%   BMI 20.08 kg/m    I/O last 3 completed shifts: In: 357.6 [I.V.:357.6] Out: 600 [Urine:600] No intake/output data recorded.  SpO2: 96 % O2 Flow Rate (L/min): 3 L/min FiO2 (%): 35 %  Estimated body mass index is 20.08 kg/m as calculated from the following:   Height as of this encounter: 5\' 8"  (1.727 m).   Weight as of this encounter: 59.9 kg.  REVIEW OF SYSTEMS  PATIENT IS UNABLE TO PROVIDE COMPLETE REVIEW OF SYSTEM S DUE TO SEVERE CRITICAL ILLNESS AND ENCEPHALOPATHY     PHYSICAL EXAMINATION:  GENERAL:critically ill appearing, +resp distress HEAD: Normocephalic, atraumatic.  EYES: Pupils equal, round, reactive to light.  No scleral icterus.  MOUTH: Moist mucosal membrane. NECK: Supple. No thyromegaly. No nodules. No JVD.  PULMONARY: +rhonchi, +wheezing CARDIOVASCULAR: S1 and S2. Regular rate and rhythm. No murmurs, rubs, or gallops.  GASTROINTESTINAL: Soft, nontender, -distended. Positive bowel sounds.  MUSCULOSKELETAL: No swelling, clubbing, or edema.  NEUROLOGIC: lethargic SKIN:intact,warm,dry    MEDICATIONS: I have reviewed all medications and  confirmed regimen as documented  CULTURE RESULTS   Recent Results (from the past 240 hour(s))  Respiratory Panel by RT PCR (Flu A&B, Covid) - Nasopharyngeal Swab     Status: None   Collection Time: 04/27/2020  7:41 PM   Specimen: Nasopharyngeal Swab  Result Value Ref Range Status   SARS Coronavirus 2 by RT PCR NEGATIVE NEGATIVE Final    Comment: (NOTE) SARS-CoV-2 target nucleic acids are NOT DETECTED. The SARS-CoV-2 RNA is generally detectable in upper respiratoy specimens during the acute phase of infection. The lowest concentration of SARS-CoV-2 viral copies this assay can detect is 131 copies/mL. A negative result does not preclude SARS-Cov-2 infection and should not be used as the sole basis for treatment or other patient management decisions. A negative result may occur with  improper specimen collection/handling, submission of specimen other than nasopharyngeal swab, presence of viral mutation(s) within the areas targeted by this assay, and inadequate number of viral copies (<131 copies/mL). A negative result must be combined with clinical observations, patient history, and epidemiological information. The expected result is Negative. Fact Sheet for Patients:  PinkCheek.be Fact Sheet for Healthcare Providers:  GravelBags.it This test is not yet ap proved or cleared by the [place] FDA and  has been authorized for detection and/or diagnosis of SARS-CoV-2 by FDA under an Emergency Use Authorization (EUA). This EUA will remain  in effect (meaning this test can be used) for the duration of the COVID-19 declaration under Section 564(b)(1) of the Act, 21 U.S.C. section 360bbb-3(b)(1), unless the authorization is terminated or revoked sooner.    Influenza A by PCR NEGATIVE NEGATIVE Final   Influenza B by PCR NEGATIVE NEGATIVE Final    Comment: (NOTE) The Xpert Xpress SARS-CoV-2/FLU/RSV assay is intended as an aid in   the diagnosis of influenza from Nasopharyngeal swab specimens and  should not be used as a sole basis for treatment. Nasal washings and  aspirates are unacceptable for Xpert Xpress SARS-CoV-2/FLU/RSV  testing. Fact Sheet for Patients: PinkCheek.be Fact Sheet for Healthcare Providers: GravelBags.it This test is not yet approved or cleared by the [place] FDA and  has been authorized for detection and/or diagnosis of SARS-CoV-2 by  FDA under an Emergency Use Authorization (EUA). This EUA will remain  in effect (meaning this test can be used) for the duration of the  Covid-19 declaration under Section 564(b)(1) of the Act, 21  U.S.C. section 360bbb-3(b)(1), unless the authorization is  terminated or revoked. Performed at [facility], [place], [place] [address]   SARS CORONAVIRUS 2 (TAT 6-24 HRS) Nasopharyngeal Nasopharyngeal Swab     Status: None   Collection Time: 04/17/20  1:27 PM   Specimen: Nasopharyngeal Swab  Result Value Ref Range Status   SARS Coronavirus 2 NEGATIVE NEGATIVE Final    Comment: (NOTE) SARS-CoV-2 target nucleic acids are NOT DETECTED. The SARS-CoV-2 RNA is generally detectable in upper and lower respiratory specimens during the acute phase of infection. Negative results do not preclude SARS-CoV-2 infection, do not rule out co-infections with other pathogens, and should not be used as the sole basis for treatment or other patient management decisions. Negative results must be combined with clinical observations, patient history, and epidemiological information. The expected result is Negative. Fact Sheet for Patients: SugarRoll.be Fact Sheet for Healthcare Providers: https://www.woods-mathews.com/ This test is not yet approved or cleared by the [place] FDA and  has been authorized for detection and/or diagnosis of SARS-CoV-2  by FDA under an Emergency Use Authorization (EUA). This EUA will  remain  in effect (meaning this test can be used) for the duration of the COVID-19 declaration under Section 56 4(b)(1) of the Act, 21 U.S.C. section 360bbb-3(b)(1), unless the authorization is terminated or revoked sooner. Performed at [facility], [place] [address], [place], [place] [address]           IMAGING    CT ANGIO HEAD W OR WO CONTRAST  Result Date: 04/20/2020 CLINICAL DATA:  Transient ischemic attack (TIA). Additional provided history by technologist: Recent seizure-like activity 2 days ago. EXAM: CT ANGIOGRAPHY HEAD AND NECK TECHNIQUE: Multidetector CT imaging of the head and neck was performed using the standard protocol during bolus administration of intravenous contrast. Multiplanar CT image reconstructions and MIPs were obtained to evaluate the vascular anatomy. Carotid stenosis measurements (when applicable) are obtained utilizing NASCET criteria, using the distal internal carotid diameter as the denominator. CONTRAST:  36mL OMNIPAQUE IOHEXOL 350 MG/ML SOLN COMPARISON:  Brain MRI 04/19/2020, head CT 04/18/2020 FINDINGS: CT HEAD FINDINGS Brain: Unchanged 1.6 cm hyperdense lesion located within the frontal horn of the left lateral ventricle and protruding into the the left foramen of Monro. 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 Stable moderate generalized parenchymal atrophy and chronic small vessel ischemic disease. There is no acute intracranial hemorrhage. No demarcated cortical infarct. No extra-axial fluid collection. No evidence of intracranial mass. No midline shift. Unchanged size of the ventricular system. Vascular: No hyperdense vessel.  Atherosclerotic calcifications. Skull: Normal. Negative for fracture or focal lesion. Sinuses: Mild paranasal sinus mucosal thickening greatest within the left maxillary sinus. No significant mastoid effusion.  Orbits: No acute abnormality. Review of the MIP images confirms the above findings CTA NECK FINDINGS Aortic arch: Standard aortic branching. Atherosclerotic plaque within the visualized aortic arch and proximal major branch vessels of the neck. No hemodynamically significant innominate or proximal subclavian artery stenosis. Right carotid system: CCA and ICA patent within the neck. Calcified plaque within the carotid bifurcation and proximal ICA. No hemodynamically significant stenosis within the carotid bifurcation. 40-50% stenosis of the proximal ICA. Left carotid system: CCA and ICA patent within the neck without measurable stenosis. Mild calcified plaque within the carotid bifurcation. Vertebral arteries: Left vertebral artery dominant. The vertebral arteries are patent within the neck bilaterally. Calcified plaque results in moderate/severe stenosis within the proximal V1 right vertebral artery. Minimal calcified plaque at the origin of the left vertebral artery. Skeleton: No acute bony abnormality or aggressive osseous lesion. Cervical spondylosis with multilevel disc space narrowing, uncovertebral and facet hypertrophy. Posterior disc osteophyte complex at C5-C6. Prominent ventral osteophytes at C5-C6 and C6-C7. Other neck: No neck mass or pathologically enlarged cervical chain lymph nodes. Upper chest: No consolidation within the imaged lung apices. Review of the MIP images confirms the above findings CTA HEAD FINDINGS Anterior circulation: The intracranial internal carotid arteries are patent. Calcified plaque within both vessels with no more than mild stenosis. The M1 middle cerebral arteries are patent without significant stenosis. No M2 proximal branch occlusion or high-grade proximal stenosis is identified. The anterior cerebral arteries are patent without significant proximal stenosis. Posterior circulation: The intracranial vertebral arteries are patent without significant stenosis, as is the basilar  artery. The posterior cerebral arteries are patent proximally without significant stenosis. Posterior communicating arteries are hypoplastic or absent bilaterally. Venous sinuses: Within limitations of contrast timing, no convincing thrombus. Anatomic variants: As described. Review of the MIP images confirms the above findings IMPRESSION: CT head:  1. No evidence of acute intracranial abnormality. 2. 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 Subependymoma is also a differential consideration. Unchanged size of the ventricular system. 3. Stable generalized parenchymal atrophy and chronic small vessel ischemic disease. 4. Mild ethmoid sinus mucosal thickening. CTA neck: 1. The bilateral common and internal carotid arteries are patent within the neck. Calcified plaque results in 40-50% stenosis of the proximal right ICA. Mild calcified plaque at the left carotid bifurcation without significant stenosis of the proximal left ICA. 2. The vertebral arteries are patent within the neck bilaterally. Calcified plaque results in moderate/severe stenosis within the proximal V1 right vertebral artery. CTA head: 1. No intracranial large vessel occlusion or proximal high-grade arterial stenosis. 2. Calcified plaque within the intracranial internal carotid arteries with no more than mild stenosis. Electronically Signed   By: Kellie Simmering DO   On: 04/20/2020 14:17   CT ANGIO NECK W OR WO CONTRAST  Result Date: 04/20/2020 CLINICAL DATA:  Transient ischemic attack (TIA). Additional provided history by technologist: Recent seizure-like activity 2 days ago. EXAM: CT ANGIOGRAPHY HEAD AND NECK TECHNIQUE: Multidetector CT imaging of the head and neck was performed using the standard protocol during bolus administration of intravenous contrast. Multiplanar CT image reconstructions and MIPs were obtained to evaluate the vascular anatomy. Carotid  stenosis measurements (when applicable) are obtained utilizing NASCET criteria, using the distal internal carotid diameter as the denominator. CONTRAST:  16mL OMNIPAQUE IOHEXOL 350 MG/ML SOLN COMPARISON:  Brain MRI 04/19/2020, head CT 04/18/2020 FINDINGS: CT HEAD FINDINGS Brain: Unchanged 1.6 cm hyperdense lesion located within the frontal horn of the left lateral ventricle and protruding into the the left foramen of Monro. 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 Stable moderate generalized parenchymal atrophy and chronic small vessel ischemic disease. There is no acute intracranial hemorrhage. No demarcated cortical infarct. No extra-axial fluid collection. No evidence of intracranial mass. No midline shift. Unchanged size of the ventricular system. Vascular: No hyperdense vessel.  Atherosclerotic calcifications. Skull: Normal. Negative for fracture or focal lesion. Sinuses: Mild paranasal sinus mucosal thickening greatest within the left maxillary sinus. No significant mastoid effusion. Orbits: No acute abnormality. Review of the MIP images confirms the above findings CTA NECK FINDINGS Aortic arch: Standard aortic branching. Atherosclerotic plaque within the visualized aortic arch and proximal major branch vessels of the neck. No hemodynamically significant innominate or proximal subclavian artery stenosis. Right carotid system: CCA and ICA patent within the neck. Calcified plaque within the carotid bifurcation and proximal ICA. No hemodynamically significant stenosis within the carotid bifurcation. 40-50% stenosis of the proximal ICA. Left carotid system: CCA and ICA patent within the neck without measurable stenosis. Mild calcified plaque within the carotid bifurcation. Vertebral arteries: Left vertebral artery dominant. The vertebral arteries are patent within the neck bilaterally. Calcified plaque results in moderate/severe stenosis within the  proximal V1 right vertebral artery. Minimal calcified plaque at the origin of the left vertebral artery. Skeleton: No acute bony abnormality or aggressive osseous lesion. Cervical spondylosis with multilevel disc space narrowing, uncovertebral and facet hypertrophy. Posterior disc osteophyte complex at C5-C6. Prominent ventral osteophytes at C5-C6 and C6-C7. Other neck: No neck mass or pathologically enlarged cervical chain lymph nodes. Upper chest: No consolidation within the imaged lung apices. Review of the MIP images confirms the above findings CTA HEAD FINDINGS Anterior circulation: The intracranial internal carotid arteries  are patent. Calcified plaque within both vessels with no more than mild stenosis. The M1 middle cerebral arteries are patent without significant stenosis. No M2 proximal branch occlusion or high-grade proximal stenosis is identified. The anterior cerebral arteries are patent without significant proximal stenosis. Posterior circulation: The intracranial vertebral arteries are patent without significant stenosis, as is the basilar artery. The posterior cerebral arteries are patent proximally without significant stenosis. Posterior communicating arteries are hypoplastic or absent bilaterally. Venous sinuses: Within limitations of contrast timing, no convincing thrombus. Anatomic variants: As described. Review of the MIP images confirms the above findings IMPRESSION: CT head: 1. No evidence of acute intracranial abnormality. 2. 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 Subependymoma is also a differential consideration. Unchanged size of the ventricular system. 3. Stable generalized parenchymal atrophy and chronic small vessel ischemic disease. 4. Mild ethmoid sinus mucosal thickening. CTA neck: 1. The bilateral common and internal carotid arteries are patent within the neck. Calcified plaque results in  40-50% stenosis of the proximal right ICA. Mild calcified plaque at the left carotid bifurcation without significant stenosis of the proximal left ICA. 2. The vertebral arteries are patent within the neck bilaterally. Calcified plaque results in moderate/severe stenosis within the proximal V1 right vertebral artery. CTA head: 1. No intracranial large vessel occlusion or proximal high-grade arterial stenosis. 2. Calcified plaque within the intracranial internal carotid arteries with no more than mild stenosis. Electronically Signed   By: Kellie Simmering DO   On: 04/20/2020 14:17     Nutrition Status: Nutrition Problem: Severe Malnutrition Etiology: social / environmental circumstances(inadequate oral intake, EtOH use) Signs/Symptoms: severe fat depletion, moderate muscle depletion, severe muscle depletion Interventions: Refer to RD note for recommendations      ASSESSMENT AND PLAN SYNOPSIS   Severe ACUTE Hypoxic and Hypercapnic Respiratory Failure from acute SEIZURES AND ACUTE ENCEPHALOPATHY WITH INABILITY TO PROTECT AIRWAY WITH ACUTE ASPIRATION PNEUMONIA  Patient had been extubated, but now has increased WOB and increased WOB from severe aspiration  NEUROLOGY ?Seziures Severe muscle weakness upper airway compromised   CARDIAC ICU monitoring  INFECTIOUS DISEASE -continue antibiotics as prescribed -follow up cultures  GI-NPO status High risk aspiration  NUTRITIONAL STATUS Nutrition Status: Nutrition Problem: Severe Malnutrition Etiology: social / environmental circumstances(inadequate oral intake, EtOH use) Signs/Symptoms: severe fat depletion, moderate muscle depletion, severe muscle depletion Interventions: Refer to RD note for recommendations   DIET-->NPO Constipation protocol as indicated  ENDO - ICU hypoglycemic\Hyperglycemia protocol -check FSBS per protocol   ELECTROLYTES -follow labs as needed -replace as needed -pharmacy consultation and  following    DVT/GI PRX ordered TRANSFUSIONS AS NEEDED MONITOR FSBS ASSESS the need for LABS as needed   Critical Care Time devoted to patient care services described in this note is 43 minutes.   Overall, patient is critically ill, prognosis is guarded.  Patient with Multiorgan failure and at high risk for cardiac arrest and death.   Patient is at very high risk for intubation Prognosis is very poor.  Corrin Parker, M.D.  [facility]  Medical Director [place] Director [facility] Cardio-Pulmonary Department

## 2020-04-21 NOTE — Progress Notes (Signed)
Nutrition Brief Follow-Up Note  Chart reviewed and discussed on rounds. Patient now transitioning to comfort care.   No further nutrition interventions warranted at this time. Please re-consult RD as needed.   Jacklynn Barnacle, MS, RD, LDN Pager number available on Amion

## 2020-04-21 NOTE — Progress Notes (Signed)
PT Cancellation Note  Patient Details Name: Henry Mays MRN: [id number] DOB: Dec 20, 1940   Cancelled Treatment:    Reason Eval/Treat Not Completed: Other (comment)(Per chart and RN, pt transitioned to comfort care. PT to sign off and complete orders.)  Lieutenant Diego PT, DPT 9:51 AM,04/21/20

## 2020-04-21 NOTE — Progress Notes (Signed)
Subjective: Patient with continued difficulty controlling his airway.  This morning appeared to aspirate.  Family has decided on comfort care.    Objective: Current vital signs: BP (!) 171/81   Pulse (!) 111   Temp 98.9 F (37.2 C) (Oral)   Resp (!) 29   Ht 5\' 8"  (1.727 m)   Wt 59.9 kg   SpO2 96%   BMI 20.08 kg/m  Vital signs in last 24 hours: Temp:  [97.6 F (36.4 C)-98.9 F (37.2 C)] 98.9 F (37.2 C) (05/13 0435) Pulse Rate:  [77-114] 111 (05/13 0700) Resp:  [12-30] 29 (05/13 0700) BP: (105-184)/(42-98) 171/81 (05/13 0700) SpO2:  [94 %-100 %] 96 % (05/13 0700) Weight:  [59.9 kg] 59.9 kg (05/13 0500)  Intake/Output from previous day: 05/12 0701 - 05/13 0700 In: 357.6 [I.V.:357.6] Out: 300 [Urine:300] Intake/Output this shift: No intake/output data recorded. Nutritional status:  Diet Order            Diet NPO time specified  Diet effective now        Diet general        Diet - low sodium heart healthy              Neurologic Exam: Mental Status: Eyes closed.  Does not open eyes with light sternal rub  Cranial Nerves: II: pupils reactive bilaterally III,IV,VI: Oculocephalic response present bilaterally.  V,VII: face symmetric  VIII: patient does not respond to verbal stimuli IX,X: gag reflex reduced, XI: trapezius strength unable to test bilaterally XII: tongue strength unable to test Motor: Moves all extremities weakly and spontaneously   Lab Results: Basic Metabolic Panel: Recent Labs  Lab 04/15/20 0441 04/15/20 0441 04/18/20 1600 04/18/20 1600 04/18/20 1958 04/19/20 0630 04/19/20 0632 04/19/20 1701 04/20/20 0613 04/21/20 0558  NA 136  --  130*  --   --   --  132*  --  134* 132*  K 3.7  --  3.3*  --   --   --  3.7  --  4.1 3.5  CL 104  --  96*  --   --   --  101  --  103 98  CO2 24  --  25  --   --   --  23  --  25 25  GLUCOSE 91  --  206*  --   --   --  165*  --  140* 183*  BUN 12  --  14  --   --   --  18  --  20 10  CREATININE 0.73   --  0.79  --   --   --  0.73  --  0.74 0.42*  CALCIUM 7.6*   < > 8.2*   < >  --   --  7.7*  --  7.4* 7.8*  MG  --   --  1.9  --  1.8 2.4  --  2.1 2.2  --   PHOS  --   --  2.0*  --  3.5 3.0  --  2.5 2.6  --    < > = values in this interval not displayed.    Liver Function Tests: Recent Labs  Lab 04/18/20 1600  AST 24  ALT 29  ALKPHOS 49  BILITOT 1.3*  PROT 7.4  ALBUMIN 3.1*   No results for input(s): LIPASE, AMYLASE in the last 168 hours. No results for input(s): AMMONIA in the last 168 hours.  CBC: Recent Labs  Lab 04/18/20 1600 04/19/20  [id number] 04/20/20 0613 04/21/20 0558  WBC 10.3 11.8* 9.1 6.7  NEUTROABS  --  7.4 6.3 5.1  HGB 11.6* 11.8* 10.3* 11.2*  HCT 33.9* 35.0* 30.9* 32.8*  MCV 94.2 96.7 97.5 95.1  PLT 151 201 219 201    Cardiac Enzymes: No results for input(s): CKTOTAL, CKMB, CKMBINDEX, TROPONINI in the last 168 hours.  Lipid Panel: No results for input(s): CHOL, TRIG, HDL, CHOLHDL, VLDL, LDLCALC in the last 168 hours.  CBG: Recent Labs  Lab 04/20/20 1949 04/20/20 2351 04/21/20 0407 04/21/20 0526 04/21/20 0722  GLUCAP 95 106* 64* 116* 109*    Microbiology: Results for orders placed or performed during the hospital encounter of 05/07/2020  Respiratory Panel by RT PCR (Flu A&B, Covid) - Nasopharyngeal Swab     Status: None   Collection Time: 04/27/2020  7:41 PM   Specimen: Nasopharyngeal Swab  Result Value Ref Range Status   SARS Coronavirus 2 by RT PCR NEGATIVE NEGATIVE Final    Comment: (NOTE) SARS-CoV-2 target nucleic acids are NOT DETECTED. The SARS-CoV-2 RNA is generally detectable in upper respiratoy specimens during the acute phase of infection. The lowest concentration of SARS-CoV-2 viral copies this assay can detect is 131 copies/mL. A negative result does not preclude SARS-Cov-2 infection and should not be used as the sole basis for treatment or other patient management decisions. A negative result may occur with  improper specimen  collection/handling, submission of specimen other than nasopharyngeal swab, presence of viral mutation(s) within the areas targeted by this assay, and inadequate number of viral copies (<131 copies/mL). A negative result must be combined with clinical observations, patient history, and epidemiological information. The expected result is Negative. Fact Sheet for Patients:  PinkCheek.be Fact Sheet for Healthcare Providers:  GravelBags.it This test is not yet ap proved or cleared by the [place] FDA and  has been authorized for detection and/or diagnosis of SARS-CoV-2 by FDA under an Emergency Use Authorization (EUA). This EUA will remain  in effect (meaning this test can be used) for the duration of the COVID-19 declaration under Section 564(b)(1) of the Act, 21 U.S.C. section 360bbb-3(b)(1), unless the authorization is terminated or revoked sooner.    Influenza A by PCR NEGATIVE NEGATIVE Final   Influenza B by PCR NEGATIVE NEGATIVE Final    Comment: (NOTE) The Xpert Xpress SARS-CoV-2/FLU/RSV assay is intended as an aid in  the diagnosis of influenza from Nasopharyngeal swab specimens and  should not be used as a sole basis for treatment. Nasal washings and  aspirates are unacceptable for Xpert Xpress SARS-CoV-2/FLU/RSV  testing. Fact Sheet for Patients: PinkCheek.be Fact Sheet for Healthcare Providers: GravelBags.it This test is not yet approved or cleared by the [place] FDA and  has been authorized for detection and/or diagnosis of SARS-CoV-2 by  FDA under an Emergency Use Authorization (EUA). This EUA will remain  in effect (meaning this test can be used) for the duration of the  Covid-19 declaration under Section 564(b)(1) of the Act, 21  U.S.C. section 360bbb-3(b)(1), unless the authorization is  terminated or revoked. Performed at [facility],  [place], [place] [address]   SARS CORONAVIRUS 2 (TAT 6-24 HRS) Nasopharyngeal Nasopharyngeal Swab     Status: None   Collection Time: 04/17/20  1:27 PM   Specimen: Nasopharyngeal Swab  Result Value Ref Range Status   SARS Coronavirus 2 NEGATIVE NEGATIVE Final    Comment: (NOTE) SARS-CoV-2 target nucleic acids are NOT DETECTED. The SARS-CoV-2 RNA is generally detectable  in upper and lower respiratory specimens during the acute phase of infection. Negative results do not preclude SARS-CoV-2 infection, do not rule out co-infections with other pathogens, and should not be used as the sole basis for treatment or other patient management decisions. Negative results must be combined with clinical observations, patient history, and epidemiological information. The expected result is Negative. Fact Sheet for Patients: SugarRoll.be Fact Sheet for Healthcare Providers: https://www.woods-mathews.com/ This test is not yet approved or cleared by the [place] FDA and  has been authorized for detection and/or diagnosis of SARS-CoV-2 by FDA under an Emergency Use Authorization (EUA). This EUA will remain  in effect (meaning this test can be used) for the duration of the COVID-19 declaration under Section 56 4(b)(1) of the Act, 21 U.S.C. section 360bbb-3(b)(1), unless the authorization is terminated or revoked sooner. Performed at [facility], [place] [address] [place], [place] [address]     Coagulation Studies: No results for input(s): LABPROT, INR in the last 72 hours.  Imaging: EEG  Result Date: 04/19/2020 Alexis Goodell, MD     04/19/2020 11:54 AM ELECTROENCEPHALOGRAM REPORT Patient: [name]       Room #: IC20A-AA EEG No. ID: [id number] Age: 79 y.o.        Sex: male Requesting Physician: Kasa Report Date:  04/19/2020       Interpreting Physician: Alexis Goodell History: Maxon Dahle is an 79 y.o. male with new onset  seizure Medications: ASA, Decadron, Keppra, Thiamine, Fentanyl, Levophed Conditions of Recording:  This is a 21 channel routine scalp EEG performed with bipolar and monopolar montages arranged in accordance to the international 10/20 system of electrode placement. One channel was dedicated to EKG recording. The patient is in the intubated and sedated state. Description:  The background activity is slow and poorly organized.  This activity is diffusely distributed with superimposed sleep transients that include symmetrical sleep spindles and vertex central sharp transients. No epileptiform activity is noted.  Hyperventilation and intermittent photic stimulation were not performed. IMPRESSION: Normal asleep electroencephalogram. There are no focal lateralizing or epileptiform features. Alexis Goodell, MD Neurology [phone] 04/19/2020, 11:27 AM   CT ANGIO HEAD W OR WO CONTRAST  Result Date: 04/20/2020 CLINICAL DATA:  Transient ischemic attack (TIA). Additional provided history by technologist: Recent seizure-like activity 2 days ago. EXAM: CT ANGIOGRAPHY HEAD AND NECK TECHNIQUE: Multidetector CT imaging of the head and neck was performed using the standard protocol during bolus administration of intravenous contrast. Multiplanar CT image reconstructions and MIPs were obtained to evaluate the vascular anatomy. Carotid stenosis measurements (when applicable) are obtained utilizing NASCET criteria, using the distal internal carotid diameter as the denominator. CONTRAST:  78mL OMNIPAQUE IOHEXOL 350 MG/ML SOLN COMPARISON:  Brain MRI 04/19/2020, head CT 04/18/2020 FINDINGS: CT HEAD FINDINGS Brain: Unchanged 1.6 cm hyperdense lesion located within the frontal horn of the left lateral ventricle and protruding into the the left foramen of Monro. 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 Stable moderate generalized parenchymal atrophy and chronic small vessel  ischemic disease. There is no acute intracranial hemorrhage. No demarcated cortical infarct. No extra-axial fluid collection. No evidence of intracranial mass. No midline shift. Unchanged size of the ventricular system. Vascular: No hyperdense vessel.  Atherosclerotic calcifications. Skull: Normal. Negative for fracture or focal lesion. Sinuses: Mild paranasal sinus mucosal thickening greatest within the left maxillary sinus. No significant mastoid effusion. Orbits: No acute abnormality. Review of the MIP images confirms  the above findings CTA NECK FINDINGS Aortic arch: Standard aortic branching. Atherosclerotic plaque within the visualized aortic arch and proximal major branch vessels of the neck. No hemodynamically significant innominate or proximal subclavian artery stenosis. Right carotid system: CCA and ICA patent within the neck. Calcified plaque within the carotid bifurcation and proximal ICA. No hemodynamically significant stenosis within the carotid bifurcation. 40-50% stenosis of the proximal ICA. Left carotid system: CCA and ICA patent within the neck without measurable stenosis. Mild calcified plaque within the carotid bifurcation. Vertebral arteries: Left vertebral artery dominant. The vertebral arteries are patent within the neck bilaterally. Calcified plaque results in moderate/severe stenosis within the proximal V1 right vertebral artery. Minimal calcified plaque at the origin of the left vertebral artery. Skeleton: No acute bony abnormality or aggressive osseous lesion. Cervical spondylosis with multilevel disc space narrowing, uncovertebral and facet hypertrophy. Posterior disc osteophyte complex at C5-C6. Prominent ventral osteophytes at C5-C6 and C6-C7. Other neck: No neck mass or pathologically enlarged cervical chain lymph nodes. Upper chest: No consolidation within the imaged lung apices. Review of the MIP images confirms the above findings CTA HEAD FINDINGS Anterior circulation: The  intracranial internal carotid arteries are patent. Calcified plaque within both vessels with no more than mild stenosis. The M1 middle cerebral arteries are patent without significant stenosis. No M2 proximal branch occlusion or high-grade proximal stenosis is identified. The anterior cerebral arteries are patent without significant proximal stenosis. Posterior circulation: The intracranial vertebral arteries are patent without significant stenosis, as is the basilar artery. The posterior cerebral arteries are patent proximally without significant stenosis. Posterior communicating arteries are hypoplastic or absent bilaterally. Venous sinuses: Within limitations of contrast timing, no convincing thrombus. Anatomic variants: As described. Review of the MIP images confirms the above findings IMPRESSION: CT head: 1. No evidence of acute intracranial abnormality. 2. 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 Subependymoma is also a differential consideration. Unchanged size of the ventricular system. 3. Stable generalized parenchymal atrophy and chronic small vessel ischemic disease. 4. Mild ethmoid sinus mucosal thickening. CTA neck: 1. The bilateral common and internal carotid arteries are patent within the neck. Calcified plaque results in 40-50% stenosis of the proximal right ICA. Mild calcified plaque at the left carotid bifurcation without significant stenosis of the proximal left ICA. 2. The vertebral arteries are patent within the neck bilaterally. Calcified plaque results in moderate/severe stenosis within the proximal V1 right vertebral artery. CTA head: 1. No intracranial large vessel occlusion or proximal high-grade arterial stenosis. 2. Calcified plaque within the intracranial internal carotid arteries with no more than mild stenosis. Electronically Signed   By: Kellie Simmering DO   On: 04/20/2020 14:17   CT ANGIO NECK W  OR WO CONTRAST  Result Date: 04/20/2020 CLINICAL DATA:  Transient ischemic attack (TIA). Additional provided history by technologist: Recent seizure-like activity 2 days ago. EXAM: CT ANGIOGRAPHY HEAD AND NECK TECHNIQUE: Multidetector CT imaging of the head and neck was performed using the standard protocol during bolus administration of intravenous contrast. Multiplanar CT image reconstructions and MIPs were obtained to evaluate the vascular anatomy. Carotid stenosis measurements (when applicable) are obtained utilizing NASCET criteria, using the distal internal carotid diameter as the denominator. CONTRAST:  75mL OMNIPAQUE IOHEXOL 350 MG/ML SOLN COMPARISON:  Brain MRI 04/19/2020, head CT 04/18/2020 FINDINGS: CT HEAD FINDINGS Brain: Unchanged 1.6 cm hyperdense lesion located within the frontal horn of the left lateral ventricle and protruding into  the the left foramen of Monro. 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 Stable moderate generalized parenchymal atrophy and chronic small vessel ischemic disease. There is no acute intracranial hemorrhage. No demarcated cortical infarct. No extra-axial fluid collection. No evidence of intracranial mass. No midline shift. Unchanged size of the ventricular system. Vascular: No hyperdense vessel.  Atherosclerotic calcifications. Skull: Normal. Negative for fracture or focal lesion. Sinuses: Mild paranasal sinus mucosal thickening greatest within the left maxillary sinus. No significant mastoid effusion. Orbits: No acute abnormality. Review of the MIP images confirms the above findings CTA NECK FINDINGS Aortic arch: Standard aortic branching. Atherosclerotic plaque within the visualized aortic arch and proximal major branch vessels of the neck. No hemodynamically significant innominate or proximal subclavian artery stenosis. Right carotid system: CCA and ICA patent within the neck. Calcified plaque within the  carotid bifurcation and proximal ICA. No hemodynamically significant stenosis within the carotid bifurcation. 40-50% stenosis of the proximal ICA. Left carotid system: CCA and ICA patent within the neck without measurable stenosis. Mild calcified plaque within the carotid bifurcation. Vertebral arteries: Left vertebral artery dominant. The vertebral arteries are patent within the neck bilaterally. Calcified plaque results in moderate/severe stenosis within the proximal V1 right vertebral artery. Minimal calcified plaque at the origin of the left vertebral artery. Skeleton: No acute bony abnormality or aggressive osseous lesion. Cervical spondylosis with multilevel disc space narrowing, uncovertebral and facet hypertrophy. Posterior disc osteophyte complex at C5-C6. Prominent ventral osteophytes at C5-C6 and C6-C7. Other neck: No neck mass or pathologically enlarged cervical chain lymph nodes. Upper chest: No consolidation within the imaged lung apices. Review of the MIP images confirms the above findings CTA HEAD FINDINGS Anterior circulation: The intracranial internal carotid arteries are patent. Calcified plaque within both vessels with no more than mild stenosis. The M1 middle cerebral arteries are patent without significant stenosis. No M2 proximal branch occlusion or high-grade proximal stenosis is identified. The anterior cerebral arteries are patent without significant proximal stenosis. Posterior circulation: The intracranial vertebral arteries are patent without significant stenosis, as is the basilar artery. The posterior cerebral arteries are patent proximally without significant stenosis. Posterior communicating arteries are hypoplastic or absent bilaterally. Venous sinuses: Within limitations of contrast timing, no convincing thrombus. Anatomic variants: As described. Review of the MIP images confirms the above findings IMPRESSION: CT head: 1. No evidence of acute intracranial abnormality. 2. 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 Subependymoma is also a differential consideration. Unchanged size of the ventricular system. 3. Stable generalized parenchymal atrophy and chronic small vessel ischemic disease. 4. Mild ethmoid sinus mucosal thickening. CTA neck: 1. The bilateral common and internal carotid arteries are patent within the neck. Calcified plaque results in 40-50% stenosis of the proximal right ICA. Mild calcified plaque at the left carotid bifurcation without significant stenosis of the proximal left ICA. 2. The vertebral arteries are patent within the neck bilaterally. Calcified plaque results in moderate/severe stenosis within the proximal V1 right vertebral artery. CTA head: 1. No intracranial large vessel occlusion or proximal high-grade arterial stenosis. 2. Calcified plaque within the intracranial internal carotid arteries with no more than mild stenosis. Electronically Signed   By: Kellie Simmering DO   On: 04/20/2020 14:17   MR BRAIN WO CONTRAST  Result Date: 04/19/2020 CLINICAL DATA:  Ataxia. Stroke suspected. EXAM: MRI HEAD WITHOUT CONTRAST TECHNIQUE: Multiplanar, multiecho pulse  sequences of the brain and surrounding structures were obtained without intravenous contrast. COMPARISON:  Head CT Apr 18, 2020 FINDINGS: Brain: No acute infarction, hemorrhage, hydrocephalus or extra-axial collection. A well-defined lesion with mildly hypointense on T1 and hyperintense on T2 located within the frontal horn of the left lateral ventricle protruding into and narrowing the corresponding foramen of Monro measuring approximately 1.6 x 1.0 x 0.9 cm. No significant change in size when compared to CT performed August 26, 2016. No evidence of trapped left lateral ventricle. Scattered foci of T2 hyperintensity are seen within the white matter of the cerebral hemispheres, nonspecific, most likely related to chronic small  vessel ischemia. Prominence of the supratentorial ventricles and cerebral sulci, reflecting parenchymal volume loss. Vascular: Normal flow voids. Skull and upper cervical spine: Normal marrow signal. Sinuses/Orbits: Mucosal thickening of the left frontal and maxillary sinuses and bilateral ethmoid cells. Other: None. IMPRESSION: 1. No acute intracranial abnormality identified. 2. 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 3. Mild chronic small vessel ischemia and parenchymal volume loss. 4. Paranasal sinus disease. Electronically Signed   By: Pedro Earls M.D.   On: 04/19/2020 14:43    Medications:  I have reviewed the patient's current medications. Scheduled: .  stroke: mapping our early stages of recovery book   Does not apply Once  . vitamin C  500 mg Oral Daily  . aspirin  81 mg Per Tube Daily  . chlorhexidine  15 mL Mouth Rinse BID  . Chlorhexidine Gluconate Cloth  6 each Topical Daily  . dexamethasone  0.25 mg Per Tube Daily  . docusate  100 mg Per Tube BID  . enoxaparin (LOVENOX) injection  40 mg Subcutaneous Q24H  . insulin aspart  0-15 Units Subcutaneous Q4H  . ipratropium-albuterol  3 mL Nebulization Q6H  . LORazepam  1 mg Intravenous Once  . mouth rinse  15 mL Mouth Rinse q12n4p  . multivitamin  15 mL Per Tube Daily    Assessment/Plan: 79 year old malewith a known history ofhypertension, osteoarthritis, alcohol use and C. difficile colitis, whowas initially admitted with ataxia and tremor. Felt to be metabolic in etiology. Patient scheduled for dischargeyesterdaybut noted to have what was felt to be a generalized tonic-clonic seizureprior to discharge.Patient with a recent MRI of the brain on 5/5 that was only significant for some atrophy and small vessel ischemic changes. Head CT without  contrast performedyesterday showedno acute changes. Patient outside window for seizure related to DT's. Although seizure may still be ETOH related. There was concern initially for thiamine deficiency as well.Patient now on Keppra and IV thiamine. EEG reviewed and only significant for normal sleep. No epileptiform activity is noted.Magnesium and phosphorus are normal. Repeat MRI of the brain personally reviewed and shows no acute changes.  CTA of the head and neck were significant for moderate to severe stenosis of the right V1 vertebral artery.   Patient extubated on yesterday and appeared to do fairly well until early this morning when he appeared to aspirate.  Per patient's wishes did not want to be trached or have feeding tube and did not want to have placement.  With this in mind family has decided to make patient comfort care.    Recommendations: 1. D/C thiamine and Dexamethasone     LOS: 7 days   Alexis Goodell, MD Neurology [phone] 04/21/2020  10:01 AM

## 2020-04-21 NOTE — Progress Notes (Signed)
Pt was NTS for a moderate amount of thick blood tinged yellow secretions. He tolerated fairly well.

## 2020-04-22 LAB — VITAMIN B1: Vitamin B1 (Thiamine): 162 nmol/L (ref 66.5–200.0)

## 2020-05-10 NOTE — Death Summary Note (Signed)
DEATH SUMMARY   Patient Details  Name: Henry Mays MRN: [id number] DOB: 04/09/1941  Admission/Discharge Information   Admit Date:  22-Apr-2020  Date of Death: Date of Death: 05-01-20  Time of Death: Time of Death: 35  Length of Stay: 8  Referring Physician: Leone Haven, MD   Reason(s) for Hospitalization  Acute Metabolic Encephalopathy Seizures Acute Hypoxic Hypercapnic Respiratory Failure Aspiration Pneumonia Hyponatremia Hypomagnesia Hypokalemia  Diagnoses  Preliminary cause of death:   Aspiration Pneumonia Secondary Diagnoses (including complications and co-morbidities):  Active Problems:   Ataxia   Unable to walk   Protein-calorie malnutrition, severe   Difficulty walking   Acute respiratory failure with hypoxia (HCC) Acute Metabolic Encephalopathy Seizures Acute Hypoxic Hypercapnic Respiratory Failure Aspiration Pneumonia Hyponatremia Hypomagnesia Hypokalemia  Brief Hospital Course (including significant findings, care, treatment, and services provided and events leading to death)  Henry Mays is a 79 y.o. year old male who This is a 79 yo male with a PMH of Near Syncope, MGUS, HTN, Cdiff, Basal Cell Carcinoma, ETOH Abuse, and Arthritis.  He presented to [facility] ER on 04-23-23 with acute onset of ataxia and inability to ambulate.  Per ER notes pt reported having a fall in Jan 2021, which at baseline he uses his walker at night only to prevent falls.  Pt reported 1 week prior to ER presentation his legs felt shaky; he had difficulty standing, therefore he started using his walker to ambulate during the day.  He lives alone at the [facility]. He does have a hx of ETOH abuse, which ER lab results revealed: Na+ 129, chloride 93, glucose 108, AST 164, ALT 121, and ammonia level 10.  Therefore, ER physician contacted hospitalist team for medsurg admission given pts need for MRI to r/o stroke, additional lab workup, and PT/OT evaluation to  determine if pt safe to be discharged back to the independent living facility.  CT Head, MR Brain both negative for any acute intracranial abnormality.  US Carotids revealed stenosis less than 50%.  Neurology was consulted, who felt pt's symptoms were metabolic related in the setting of electrolyte imbalance.  On 04/18/20 he was noted to have seizure like activity/tremors and slurred speech suspected in the setting of ETOH withdrawal.  He received IV Ativan and was transferred to [facility] unit.  Repeat CT Head negative for any acute changes.  Upon arrival to stepdown, he exhibited severe agitation with tremors requiring 2 mg of versed and 50 mcg of fentanyl.  He was subsequently unable to protect his airway due to excessive secretions and sedating medications, ultimately requiring intubation.  He was extubated on 5/13.  However early in the morning of 05-02-2023, he became poorly responsive with a weak cough, struggling to remove secretions and increased work of brearthing, concerning for ongoing Aspiration.  After family discussion, pt's family elected to proceed with comfort care measures and withdraw all life sustaining measures.     Pertinent Labs and Studies  Significant Diagnostic Studies EEG  Result Date: 04/19/2020 Henry Goodell, MD     04/19/2020 11:54 AM ELECTROENCEPHALOGRAM REPORT Patient: Henry Mays       Room #: IC20A-AA EEG No. ID: [id number] Age: 79 y.o.        Sex: male Requesting Physician: Henry Mays Report Date:  04/19/2020       Interpreting Physician: Henry Mays History: Henry Mays is an 79 y.o. male with new onset seizure Medications: ASA, Decadron, Keppra, Thiamine, Fentanyl, Levophed Conditions of Recording:  This is a 90  channel routine scalp EEG performed with bipolar and monopolar montages arranged in accordance to the international 10/20 system of electrode placement. One channel was dedicated to EKG recording. The patient is in the intubated and sedated state. Description:   The background activity is slow and poorly organized.  This activity is diffusely distributed with superimposed sleep transients that include symmetrical sleep spindles and vertex central sharp transients. No epileptiform activity is noted.  Hyperventilation and intermittent photic stimulation were not performed. IMPRESSION: Normal asleep electroencephalogram. There are no focal lateralizing or epileptiform features. Henry Goodell, MD Neurology [phone] 04/19/2020, 11:27 AM   CT ANGIO HEAD W OR WO CONTRAST  Result Date: 04/20/2020 CLINICAL DATA:  Transient ischemic attack (TIA). Additional provided history by technologist: Recent seizure-like activity 2 days ago. EXAM: CT ANGIOGRAPHY HEAD AND NECK TECHNIQUE: Multidetector CT imaging of the head and neck was performed using the standard protocol during bolus administration of intravenous contrast. Multiplanar CT image reconstructions and MIPs were obtained to evaluate the vascular anatomy. Carotid stenosis measurements (when applicable) are obtained utilizing NASCET criteria, using the distal internal carotid diameter as the denominator. CONTRAST:  67mL OMNIPAQUE IOHEXOL 350 MG/ML SOLN COMPARISON:  Brain MRI 04/19/2020, head CT 04/18/2020 FINDINGS: CT HEAD FINDINGS Brain: Unchanged 1.6 cm hyperdense lesion located within the frontal horn of the left lateral ventricle and protruding into the the left foramen of Monro. 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 Stable moderate generalized parenchymal atrophy and chronic small vessel ischemic disease. There is no acute intracranial hemorrhage. No demarcated cortical infarct. No extra-axial fluid collection. No evidence of intracranial mass. No midline shift. Unchanged size of the ventricular system. Vascular: No hyperdense vessel.  Atherosclerotic calcifications. Skull: Normal. Negative for fracture or focal lesion. Sinuses: Mild paranasal sinus mucosal  thickening greatest within the left maxillary sinus. No significant mastoid effusion. Orbits: No acute abnormality. Review of the MIP images confirms the above findings CTA NECK FINDINGS Aortic arch: Standard aortic branching. Atherosclerotic plaque within the visualized aortic arch and proximal major branch vessels of the neck. No hemodynamically significant innominate or proximal subclavian artery stenosis. Right carotid system: CCA and ICA patent within the neck. Calcified plaque within the carotid bifurcation and proximal ICA. No hemodynamically significant stenosis within the carotid bifurcation. 40-50% stenosis of the proximal ICA. Left carotid system: CCA and ICA patent within the neck without measurable stenosis. Mild calcified plaque within the carotid bifurcation. Vertebral arteries: Left vertebral artery dominant. The vertebral arteries are patent within the neck bilaterally. Calcified plaque results in moderate/severe stenosis within the proximal V1 right vertebral artery. Minimal calcified plaque at the origin of the left vertebral artery. Skeleton: No acute bony abnormality or aggressive osseous lesion. Cervical spondylosis with multilevel disc space narrowing, uncovertebral and facet hypertrophy. Posterior disc osteophyte complex at C5-C6. Prominent ventral osteophytes at C5-C6 and C6-C7. Other neck: No neck mass or pathologically enlarged cervical chain lymph nodes. Upper chest: No consolidation within the imaged lung apices. Review of the MIP images confirms the above findings CTA HEAD FINDINGS Anterior circulation: The intracranial internal carotid arteries are patent. Calcified plaque within both vessels with no more than mild stenosis. The M1 middle cerebral arteries are patent without significant stenosis. No M2 proximal branch occlusion or high-grade proximal stenosis is identified. The anterior cerebral arteries are patent without significant proximal stenosis. Posterior circulation: The  intracranial vertebral arteries are patent without significant stenosis, as is the basilar artery. The posterior cerebral  arteries are patent proximally without significant stenosis. Posterior communicating arteries are hypoplastic or absent bilaterally. Venous sinuses: Within limitations of contrast timing, no convincing thrombus. Anatomic variants: As described. Review of the MIP images confirms the above findings IMPRESSION: CT head: 1. No evidence of acute intracranial abnormality. 2. 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 Subependymoma is also a differential consideration. Unchanged size of the ventricular system. 3. Stable generalized parenchymal atrophy and chronic small vessel ischemic disease. 4. Mild ethmoid sinus mucosal thickening. CTA neck: 1. The bilateral common and internal carotid arteries are patent within the neck. Calcified plaque results in 40-50% stenosis of the proximal right ICA. Mild calcified plaque at the left carotid bifurcation without significant stenosis of the proximal left ICA. 2. The vertebral arteries are patent within the neck bilaterally. Calcified plaque results in moderate/severe stenosis within the proximal V1 right vertebral artery. CTA head: 1. No intracranial large vessel occlusion or proximal high-grade arterial stenosis. 2. Calcified plaque within the intracranial internal carotid arteries with no more than mild stenosis. Electronically Signed   By: Kellie Simmering DO   On: 04/20/2020 14:17   DG Abd 1 View  Result Date: 04/18/2020 CLINICAL DATA:  Check gastric catheter placement EXAM: ABDOMEN - 1 VIEW COMPARISON:  09/29/2016 FINDINGS: Gastric catheter is now seen with the tip in the stomach. Proximal side port lies at the gastroesophageal junction. This could be advanced further into the stomach. The visualized upper abdomen is unremarkable. IMPRESSION: Gastric catheter as  described which could be advanced further into the stomach. Electronically Signed   By: Inez Catalina M.D.   On: 04/18/2020 19:56   CT HEAD WO CONTRAST  Result Date: 04/18/2020 CLINICAL DATA:  Recent seizure-like activity EXAM: CT HEAD WITHOUT CONTRAST TECHNIQUE: Contiguous axial images were obtained from the base of the skull through the vertex without intravenous contrast. COMPARISON:  04/14/2011 FINDINGS: Brain: Mild atrophic changes are identified as are mild chronic white matter ischemic change. A stable colloid cyst is noted just to the left of the foramen of Monro measuring approximately 16 mm. No new focal mass lesion is seen. No findings to suggest acute hemorrhage or acute infarction are noted. Vascular: No hyperdense vessel or unexpected calcification. Skull: Normal. Negative for fracture or focal lesion. Sinuses/Orbits: Mild mucosal thickening is noted in the left maxillary antrum. This is chronic in nature and stable from the previous exam. Other: None IMPRESSION: Chronic atrophic and ischemic changes. 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 Electronically Signed   By: Inez Catalina M.D.   On: 04/18/2020 15:28   CT Head Wo Contrast  Result Date: 05/02/2020 CLINICAL DATA:  Balance difficulty EXAM: CT HEAD WITHOUT CONTRAST TECHNIQUE: Contiguous axial images were obtained from the base of the skull through the vertex without intravenous contrast. COMPARISON:  CT 12/16/2019, MRI 5 /10/2019 FINDINGS: Brain: No acute territorial infarction or hemorrhage is visualized. 16 mm slightly dense mass at the left foramen of Monro, consistent with colloid cysts, without significant change. Moderate atrophy. Stable ventricular prominence. Patchy white matter hypodensity consistent with chronic small vessel ischemic change. Vascular: No hyperdense vessel.  Carotid vascular calcification Skull: Normal. Negative for fracture or focal lesion. Sinuses/Orbits: Mucosal thickening in the left maxillary  sinus Other: None IMPRESSION: 1. No CT evidence for acute intracranial abnormality 2. 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 3. Atrophy and chronic small vessel ischemic change of the white  matter Electronically Signed   By: Donavan Foil M.D.   On: 04/17/2020 20:49   CT ANGIO NECK W OR WO CONTRAST  Result Date: 04/20/2020 CLINICAL DATA:  Transient ischemic attack (TIA). Additional provided history by technologist: Recent seizure-like activity 2 days ago. EXAM: CT ANGIOGRAPHY HEAD AND NECK TECHNIQUE: Multidetector CT imaging of the head and neck was performed using the standard protocol during bolus administration of intravenous contrast. Multiplanar CT image reconstructions and MIPs were obtained to evaluate the vascular anatomy. Carotid stenosis measurements (when applicable) are obtained utilizing NASCET criteria, using the distal internal carotid diameter as the denominator. CONTRAST:  32mL OMNIPAQUE IOHEXOL 350 MG/ML SOLN COMPARISON:  Brain MRI 04/19/2020, head CT 04/18/2020 FINDINGS: CT HEAD FINDINGS Brain: Unchanged 1.6 cm hyperdense lesion located within the frontal horn of the left lateral ventricle and protruding into the the left foramen of Monro. 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 Stable moderate generalized parenchymal atrophy and chronic small vessel ischemic disease. There is no acute intracranial hemorrhage. No demarcated cortical infarct. No extra-axial fluid collection. No evidence of intracranial mass. No midline shift. Unchanged size of the ventricular system. Vascular: No hyperdense vessel.  Atherosclerotic calcifications. Skull: Normal. Negative for fracture or focal lesion. Sinuses: Mild paranasal sinus mucosal thickening greatest within the left maxillary sinus. No significant mastoid effusion. Orbits: No acute abnormality. Review of the MIP images confirms the above findings CTA NECK FINDINGS Aortic arch:  Standard aortic branching. Atherosclerotic plaque within the visualized aortic arch and proximal major branch vessels of the neck. No hemodynamically significant innominate or proximal subclavian artery stenosis. Right carotid system: CCA and ICA patent within the neck. Calcified plaque within the carotid bifurcation and proximal ICA. No hemodynamically significant stenosis within the carotid bifurcation. 40-50% stenosis of the proximal ICA. Left carotid system: CCA and ICA patent within the neck without measurable stenosis. Mild calcified plaque within the carotid bifurcation. Vertebral arteries: Left vertebral artery dominant. The vertebral arteries are patent within the neck bilaterally. Calcified plaque results in moderate/severe stenosis within the proximal V1 right vertebral artery. Minimal calcified plaque at the origin of the left vertebral artery. Skeleton: No acute bony abnormality or aggressive osseous lesion. Cervical spondylosis with multilevel disc space narrowing, uncovertebral and facet hypertrophy. Posterior disc osteophyte complex at C5-C6. Prominent ventral osteophytes at C5-C6 and C6-C7. Other neck: No neck mass or pathologically enlarged cervical chain lymph nodes. Upper chest: No consolidation within the imaged lung apices. Review of the MIP images confirms the above findings CTA HEAD FINDINGS Anterior circulation: The intracranial internal carotid arteries are patent. Calcified plaque within both vessels with no more than mild stenosis. The M1 middle cerebral arteries are patent without significant stenosis. No M2 proximal branch occlusion or high-grade proximal stenosis is identified. The anterior cerebral arteries are patent without significant proximal stenosis. Posterior circulation: The intracranial vertebral arteries are patent without significant stenosis, as is the basilar artery. The posterior cerebral arteries are patent proximally without significant stenosis. Posterior  communicating arteries are hypoplastic or absent bilaterally. Venous sinuses: Within limitations of contrast timing, no convincing thrombus. Anatomic variants: As described. Review of the MIP images confirms the above findings IMPRESSION: CT head: 1. No evidence of acute intracranial abnormality. 2. 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 Subependymoma is also a differential consideration. Unchanged size of the ventricular system. 3. Stable generalized parenchymal atrophy  and chronic small vessel ischemic disease. 4. Mild ethmoid sinus mucosal thickening. CTA neck: 1. The bilateral common and internal carotid arteries are patent within the neck. Calcified plaque results in 40-50% stenosis of the proximal right ICA. Mild calcified plaque at the left carotid bifurcation without significant stenosis of the proximal left ICA. 2. The vertebral arteries are patent within the neck bilaterally. Calcified plaque results in moderate/severe stenosis within the proximal V1 right vertebral artery. CTA head: 1. No intracranial large vessel occlusion or proximal high-grade arterial stenosis. 2. Calcified plaque within the intracranial internal carotid arteries with no more than mild stenosis. Electronically Signed   By: Kellie Simmering DO   On: 04/20/2020 14:17   MR BRAIN WO CONTRAST  Result Date: 04/19/2020 CLINICAL DATA:  Ataxia. Stroke suspected. EXAM: MRI HEAD WITHOUT CONTRAST TECHNIQUE: Multiplanar, multiecho pulse sequences of the brain and surrounding structures were obtained without intravenous contrast. COMPARISON:  Head CT Apr 18, 2020 FINDINGS: Brain: No acute infarction, hemorrhage, hydrocephalus or extra-axial collection. A well-defined lesion with mildly hypointense on T1 and hyperintense on T2 located within the frontal horn of the left lateral ventricle protruding into and narrowing the corresponding foramen of Monro  measuring approximately 1.6 x 1.0 x 0.9 cm. No significant change in size when compared to CT performed August 26, 2016. No evidence of trapped left lateral ventricle. Scattered foci of T2 hyperintensity are seen within the white matter of the cerebral hemispheres, nonspecific, most likely related to chronic small vessel ischemia. Prominence of the supratentorial ventricles and cerebral sulci, reflecting parenchymal volume loss. Vascular: Normal flow voids. Skull and upper cervical spine: Normal marrow signal. Sinuses/Orbits: Mucosal thickening of the left frontal and maxillary sinuses and bilateral ethmoid cells. Other: None. IMPRESSION: 1. No acute intracranial abnormality identified. 2. 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 3. Mild chronic small vessel ischemia and parenchymal volume loss. 4. Paranasal sinus disease. Electronically Signed   By: Pedro Earls M.D.   On: 04/19/2020 14:43   MR BRAIN WO CONTRAST  Result Date: 04/14/2020 CLINICAL DATA:  Initial evaluation for acute ataxia, dizziness, leg weakness. EXAM: MRI HEAD WITHOUT CONTRAST TECHNIQUE: Multiplanar, multiecho pulse sequences of the brain and surrounding structures were obtained without intravenous contrast. COMPARISON:  Comparison made with prior CT from earlier same day as well as previous brain MRI from 04/20/2019. FINDINGS: Brain: Diffuse prominence of the CSF containing spaces compatible generalized age-related cerebral atrophy. Mild patchy T2/FLAIR hyperintensity within the periventricular deep white matter both cerebral hemispheres most consistent with chronic small vessel ischemic disease, mild for age. No abnormal foci of restricted diffusion to suggest acute or subacute ischemia. Gray-white matter differentiation maintained. No encephalomalacia to  suggest chronic cortical infarction. No foci of susceptibility artifact to suggest acute or chronic intracranial hemorrhage. Colloid cyst positioned at the left aspect of the foramen of Monro again seen, relatively stable in size measuring 16 x 10 x 10 mm. Mild ventriculomegaly is unchanged, most likely related to underlying parenchymal volume loss. No evidence for obstructive hydrocephalus. No other mass lesion, mass effect, or midline shift. No extra-axial fluid collection. Small DVA noted at the left cerebellum, stable. Pituitary gland and suprasellar region normal. Midline structures intact. Vascular: Major intracranial vascular flow voids are maintained. Skull and upper cervical spine: Craniocervical junction within normal limits. Bone marrow signal intensity normal. No scalp soft tissue abnormality. Sinuses/Orbits:  Globes and orbital soft tissues within normal limits. Mild-to-moderate mucosal thickening noted within the left maxillary sinus, likely allergic/inflammatory in nature. Paranasal sinuses are otherwise clear. No mastoid effusion. Inner ear structures grossly normal. Other: None. IMPRESSION: 1. No acute intracranial abnormality. 2. Colloid cyst positioned at the left foramen of Monro, stable from previous. Stable ventricular size without evidence for obstructive hydrocephalus. 3. Underlying age-related cerebral atrophy with mild chronic small vessel ischemic disease. Electronically Signed   By: Jeannine Boga M.D.   On: 04/14/2020 01:39   US Carotid Bilateral (at [facility] and [facility] only)  Result Date: 04/14/2020 CLINICAL DATA:  TIA. EXAM: BILATERAL CAROTID DUPLEX ULTRASOUND TECHNIQUE: Pearline Cables scale imaging, color Doppler and duplex ultrasound were performed of bilateral carotid and vertebral arteries in the neck. COMPARISON:  None. FINDINGS: Criteria: Quantification of carotid stenosis is based on velocity parameters that correlate the residual internal carotid diameter with NASCET-based stenosis  levels, using the diameter of the distal internal carotid lumen as the denominator for stenosis measurement. The following velocity measurements were obtained: RIGHT ICA: 87/22 cm/sec CCA: 123456 cm/sec SYSTOLIC ICA/CCA RATIO:  1.5 ECA: 108 cm/sec LEFT ICA: 98/27 cm/sec CCA: 0000000 cm/sec SYSTOLIC ICA/CCA RATIO:  1.5 ECA: 120 cm/sec RIGHT CAROTID ARTERY: Echogenic calcified plaque at the right carotid bulb. External carotid artery is patent with normal waveform. Normal waveforms and velocities in the internal carotid artery. RIGHT VERTEBRAL ARTERY: Antegrade flow and normal waveform in the right vertebral artery. LEFT CAROTID ARTERY: Echogenic calcified plaque at the left carotid bulb. External carotid artery is patent with normal waveform. Normal waveforms and velocities in the internal carotid artery. LEFT VERTEBRAL ARTERY: Antegrade flow and normal waveform in the left vertebral artery. IMPRESSION: 1. Atherosclerotic plaque at the carotid bulbs bilaterally. Estimated degree of stenosis in the internal carotid arteries is less than 50% bilaterally. 2. Patent vertebral arteries with antegrade flow. Electronically Signed   By: Markus Daft M.D.   On: 04/14/2020 10:31   DG Chest Port 1 View  Result Date: 04/18/2020 CLINICAL DATA:  Check endotracheal tube placement EXAM: PORTABLE CHEST 1 VIEW COMPARISON:  Film from earlier in the same day. FINDINGS: Cardiac shadow is stable. Aortic calcifications are again seen. Endotracheal tube is noted 6 cm above the carina in satisfactory position. The lungs are hyperinflated with mild left basilar scarring identified IMPRESSION: COPD with left basilar scarring. Endotracheal tube in satisfactory position. Electronically Signed   By: Inez Catalina M.D.   On: 04/18/2020 19:55   DG Chest Port 1 View  Result Date: 04/18/2020 CLINICAL DATA:  Cough EXAM: PORTABLE CHEST 1 VIEW COMPARISON:  04/15/2020 FINDINGS: Left basilar scarring or atelectasis. Right lung clear. Heart is normal  size. No effusions or acute bony abnormality. Old healed proximal right humeral fracture with deformity, stable. IMPRESSION: Left base scarring or atelectasis. Electronically Signed   By: Rolm Baptise M.D.   On: 04/18/2020 09:37   DG Chest Port 1 View  Result Date: 04/15/2020 CLINICAL DATA:  Hypoxia EXAM: PORTABLE CHEST 1 VIEW COMPARISON:  September 01, 2016 FINDINGS: There is scarring in the left base. There is a questionable minimal left pleural effusion. The lungs elsewhere are clear. Heart size and pulmonary vascularity are normal. No adenopathy. There is evidence of old trauma in the proximal right humerus with remodeling. There is severe arthropathy in the right shoulder. IMPRESSION: Scarring left base with questionable minimal left pleural effusion. Lungs elsewhere clear. Cardiac silhouette within normal limits. Prior trauma with remodeling proximal right humerus. Electronically Signed  By: Lowella Grip III M.D.   On: 04/15/2020 12:17   ECHOCARDIOGRAM COMPLETE  Result Date: 04/14/2020    ECHOCARDIOGRAM REPORT   Patient Name:   SHAMARCUS INGRAM Date of Exam: 04/14/2020 Medical Rec #:  [id number]      Height:       68.0 in Accession #:    [id number]     Weight:       135.0 lb Date of Birth:  December 29, 1940     BSA:          1.729 m Patient Age:    58 years       BP:           154/76 mmHg Patient Gender: M              HR:           98 bpm. Exam Location:  [facility] Procedure: 2D Echo, Cardiac Doppler and Color Doppler Indications:     TIA 435.9  History:         Patient has prior history of Echocardiogram examinations, most                  recent 08/28/2016. Risk Factors:Hypertension. Near syncope.  Sonographer:     Sherrie Sport RDCS (AE) Referring Phys:  [id number] [place] Diagnosing Phys: Serafina Royals MD IMPRESSIONS  1. Left ventricular ejection fraction, by estimation, is 55 to 60%. The left ventricle has normal function. The left ventricle has no regional wall motion abnormalities. Left ventricular  diastolic parameters were normal.  2. Right ventricular systolic function is normal. The right ventricular size is normal. There is normal pulmonary artery systolic pressure.  3. The mitral valve is normal in structure. Trivial mitral valve regurgitation.  4. The aortic valve is normal in structure. Aortic valve regurgitation is not visualized. FINDINGS  Left Ventricle: Left ventricular ejection fraction, by estimation, is 55 to 60%. The left ventricle has normal function. The left ventricle has no regional wall motion abnormalities. The left ventricular internal cavity size was normal in size. There is  no left ventricular hypertrophy. Left ventricular diastolic parameters were normal. Right Ventricle: The right ventricular size is normal. No increase in right ventricular wall thickness. Right ventricular systolic function is normal. There is normal pulmonary artery systolic pressure. The tricuspid regurgitant velocity is 1.48 m/s, and  with an assumed right atrial pressure of 10 mmHg, the estimated right ventricular systolic pressure is 123456 mmHg. Left Atrium: Left atrial size was normal in size. Right Atrium: Right atrial size was normal in size. Pericardium: There is no evidence of pericardial effusion. Mitral Valve: The mitral valve is normal in structure. Trivial mitral valve regurgitation. Tricuspid Valve: The tricuspid valve is normal in structure. Tricuspid valve regurgitation is trivial. Aortic Valve: The aortic valve is normal in structure. Aortic valve regurgitation is not visualized. Aortic valve mean gradient measures 2.0 mmHg. Aortic valve peak gradient measures 4.2 mmHg. Aortic valve area, by VTI measures 2.75 cm. Pulmonic Valve: The pulmonic valve was normal in structure. Pulmonic valve regurgitation is not visualized. Aorta: The aortic root and ascending aorta are structurally normal, with no evidence of dilitation. IAS/Shunts: No atrial level shunt detected by color flow Doppler.  LEFT VENTRICLE  PLAX 2D LVIDd:         3.92 cm  Diastology LVIDs:         2.60 cm  LV e' lateral:   5.55 cm/s LV PW:  1.02 cm  LV E/e' lateral: 11.2 LV IVS:        1.06 cm  LV e' medial:    5.55 cm/s LVOT diam:     2.00 cm  LV E/e' medial:  11.2 LV SV:         47 LV SV Index:   27 LVOT Area:     3.14 cm  RIGHT VENTRICLE RV Basal diam:  3.38 cm RV S prime:     21.40 cm/s TAPSE (M-mode): 4.0 cm LEFT ATRIUM           Index       RIGHT ATRIUM           Index LA diam:      3.00 cm 1.73 cm/m  RA Area:     12.70 cm LA Vol (A2C): 33.6 ml 19.43 ml/m RA Volume:   26.10 ml  15.09 ml/m LA Vol (A4C): 51.1 ml 29.55 ml/m  AORTIC VALVE                   PULMONIC VALVE AV Area (Vmax):    2.58 cm    PV Vmax:        0.79 m/s AV Area (Vmean):   2.34 cm    PV Peak grad:   2.5 mmHg AV Area (VTI):     2.75 cm    RVOT Peak grad: 4 mmHg AV Vmax:           102.55 cm/s AV Vmean:          64.500 cm/s AV VTI:            0.172 m AV Peak Grad:      4.2 mmHg AV Mean Grad:      2.0 mmHg LVOT Vmax:         84.20 cm/s LVOT Vmean:        48.100 cm/s LVOT VTI:          0.150 m LVOT/AV VTI ratio: 0.87  AORTA Ao Root diam: 2.90 cm MITRAL VALVE               TRICUSPID VALVE MV Area (PHT): 2.62 cm    TR Peak grad:   8.8 mmHg MV Decel Time: 289 msec    TR Vmax:        148.00 cm/s MV E velocity: 62.10 cm/s MV A velocity: 85.90 cm/s  SHUNTS MV E/A ratio:  0.72        Systemic VTI:  0.15 m                            Systemic Diam: 2.00 cm Serafina Royals MD Electronically signed by Serafina Royals MD Signature Date/Time: 04/14/2020/1:42:34 PM    Final     Microbiology Recent Results (from the past 240 hour(s))  Respiratory Panel by RT PCR (Flu A&B, Covid) - Nasopharyngeal Swab     Status: None   Collection Time: 04/12/2020  7:41 PM   Specimen: Nasopharyngeal Swab  Result Value Ref Range Status   SARS Coronavirus 2 by RT PCR NEGATIVE NEGATIVE Final    Comment: (NOTE) SARS-CoV-2 target nucleic acids are NOT DETECTED. The SARS-CoV-2 RNA is generally  detectable in upper respiratoy specimens during the acute phase of infection. The lowest concentration of SARS-CoV-2 viral copies this assay can detect is 131 copies/mL. A negative result does not preclude SARS-Cov-2 infection and should not be used as the sole basis  for treatment or other patient management decisions. A negative result may occur with  improper specimen collection/handling, submission of specimen other than nasopharyngeal swab, presence of viral mutation(s) within the areas targeted by this assay, and inadequate number of viral copies (<131 copies/mL). A negative result must be combined with clinical observations, patient history, and epidemiological information. The expected result is Negative. Fact Sheet for Patients:  PinkCheek.be Fact Sheet for Healthcare Providers:  GravelBags.it This test is not yet ap proved or cleared by the [place] FDA and  has been authorized for detection and/or diagnosis of SARS-CoV-2 by FDA under an Emergency Use Authorization (EUA). This EUA will remain  in effect (meaning this test can be used) for the duration of the COVID-19 declaration under Section 564(b)(1) of the Act, 21 U.S.C. section 360bbb-3(b)(1), unless the authorization is terminated or revoked sooner.    Influenza A by PCR NEGATIVE NEGATIVE Final   Influenza B by PCR NEGATIVE NEGATIVE Final    Comment: (NOTE) The Xpert Xpress SARS-CoV-2/FLU/RSV assay is intended as an aid in  the diagnosis of influenza from Nasopharyngeal swab specimens and  should not be used as a sole basis for treatment. Nasal washings and  aspirates are unacceptable for Xpert Xpress SARS-CoV-2/FLU/RSV  testing. Fact Sheet for Patients: PinkCheek.be Fact Sheet for Healthcare Providers: GravelBags.it This test is not yet approved or cleared by the [place] FDA and  has been  authorized for detection and/or diagnosis of SARS-CoV-2 by  FDA under an Emergency Use Authorization (EUA). This EUA will remain  in effect (meaning this test can be used) for the duration of the  Covid-19 declaration under Section 564(b)(1) of the Act, 21  U.S.C. section 360bbb-3(b)(1), unless the authorization is  terminated or revoked. Performed at [facility], [place], [place] [address]   SARS CORONAVIRUS 2 (TAT 6-24 HRS) Nasopharyngeal Nasopharyngeal Swab     Status: None   Collection Time: 04/17/20  1:27 PM   Specimen: Nasopharyngeal Swab  Result Value Ref Range Status   SARS Coronavirus 2 NEGATIVE NEGATIVE Final    Comment: (NOTE) SARS-CoV-2 target nucleic acids are NOT DETECTED. The SARS-CoV-2 RNA is generally detectable in upper and lower respiratory specimens during the acute phase of infection. Negative results do not preclude SARS-CoV-2 infection, do not rule out co-infections with other pathogens, and should not be used as the sole basis for treatment or other patient management decisions. Negative results must be combined with clinical observations, patient history, and epidemiological information. The expected result is Negative. Fact Sheet for Patients: SugarRoll.be Fact Sheet for Healthcare Providers: https://www.woods-mathews.com/ This test is not yet approved or cleared by the [place] FDA and  has been authorized for detection and/or diagnosis of SARS-CoV-2 by FDA under an Emergency Use Authorization (EUA). This EUA will remain  in effect (meaning this test can be used) for the duration of the COVID-19 declaration under Section 56 4(b)(1) of the Act, 21 U.S.C. section 360bbb-3(b)(1), unless the authorization is terminated or revoked sooner. Performed at [facility], [place] [address], [place], [place] [address]     Lab Basic Metabolic Panel: Recent Labs  Lab 04/15/20 0441  04/18/20 1600 04/18/20 1958 04/19/20 0630 04/19/20 [id number] 04/19/20 1701 04/20/20 0613 04/21/20 0558  NA 136 130*  --   --  132*  --  134* 132*  K 3.7 3.3*  --   --  3.7  --  4.1 3.5  CL 104 96*  --   --  101  --  103 98  CO2 24 25  --   --  23  --  25 25  GLUCOSE 91 206*  --   --  165*  --  140* 183*  BUN 12 14  --   --  18  --  20 10  CREATININE 0.73 0.79  --   --  0.73  --  0.74 0.42*  CALCIUM 7.6* 8.2*  --   --  7.7*  --  7.4* 7.8*  MG  --  1.9 1.8 2.4  --  2.1 2.2  --   PHOS  --  2.0* 3.5 3.0  --  2.5 2.6  --    Liver Function Tests: Recent Labs  Lab 04/18/20 1600  AST 24  ALT 29  ALKPHOS 49  BILITOT 1.3*  PROT 7.4  ALBUMIN 3.1*   No results for input(s): LIPASE, AMYLASE in the last 168 hours. No results for input(s): AMMONIA in the last 168 hours. CBC: Recent Labs  Lab 04/18/20 1600 04/19/20 0632 04/20/20 0613 04/21/20 0558  WBC 10.3 11.8* 9.1 6.7  NEUTROABS  --  7.4 6.3 5.1  HGB 11.6* 11.8* 10.3* 11.2*  HCT 33.9* 35.0* 30.9* 32.8*  MCV 94.2 96.7 97.5 95.1  PLT 151 201 219 201   Cardiac Enzymes: No results for input(s): CKTOTAL, CKMB, CKMBINDEX, TROPONINI in the last 168 hours. Sepsis Labs: Recent Labs  Lab 04/18/20 1600 04/19/20 0632 04/20/20 0613 04/21/20 0558  WBC 10.3 11.8* 9.1 6.7    Procedures/Operations  5/10: Endotracheal Intubation 5/11: EEG       Darel Hong, [facility] [place] [facility] Pager: [phone]  Bradly Bienenstock 25-Apr-2020, 3:19 AM

## 2020-05-10 DEATH — deceased

## 2020-06-06 ENCOUNTER — Ambulatory Visit: Payer: Medicare Other | Admitting: Oncology

## 2020-06-06 ENCOUNTER — Other Ambulatory Visit: Payer: Medicare Other

## 2020-06-17 ENCOUNTER — Ambulatory Visit: Payer: Medicare Other

## 2020-08-10 ENCOUNTER — Ambulatory Visit: Payer: Medicare Other | Admitting: Family Medicine
